# Patient Record
Sex: Female | Born: 1940
Health system: Southern US, Community
[De-identification: ages and names within clinical notes are randomized; demographics above are authoritative.]

## PROBLEM LIST (undated history)

## (undated) DIAGNOSIS — L900 Lichen sclerosus et atrophicus: Secondary | ICD-10-CM

## (undated) DIAGNOSIS — M1990 Unspecified osteoarthritis, unspecified site: Secondary | ICD-10-CM

## (undated) DIAGNOSIS — Z923 Personal history of irradiation: Secondary | ICD-10-CM

## (undated) DIAGNOSIS — C50919 Malignant neoplasm of unspecified site of unspecified female breast: Secondary | ICD-10-CM

## (undated) DIAGNOSIS — Z9221 Personal history of antineoplastic chemotherapy: Secondary | ICD-10-CM

## (undated) DIAGNOSIS — F419 Anxiety disorder, unspecified: Secondary | ICD-10-CM

## (undated) DIAGNOSIS — N879 Dysplasia of cervix uteri, unspecified: Secondary | ICD-10-CM

## (undated) DIAGNOSIS — E785 Hyperlipidemia, unspecified: Secondary | ICD-10-CM

## (undated) DIAGNOSIS — B029 Zoster without complications: Secondary | ICD-10-CM

## (undated) DIAGNOSIS — K219 Gastro-esophageal reflux disease without esophagitis: Secondary | ICD-10-CM

## (undated) DIAGNOSIS — I10 Essential (primary) hypertension: Secondary | ICD-10-CM

## (undated) HISTORY — PX: TUBAL LIGATION: SHX77

## (undated) HISTORY — DX: Malignant neoplasm of unspecified site of unspecified female breast: C50.919

## (undated) HISTORY — DX: Zoster without complications: B02.9

## (undated) HISTORY — DX: Unspecified osteoarthritis, unspecified site: M19.90

## (undated) HISTORY — PX: BREAST SURGERY: SHX581

## (undated) HISTORY — DX: Lichen sclerosus et atrophicus: L90.0

## (undated) HISTORY — DX: Hyperlipidemia, unspecified: E78.5

## (undated) HISTORY — PX: BREAST LUMPECTOMY: SHX2

## (undated) HISTORY — DX: Essential (primary) hypertension: I10

## (undated) HISTORY — PX: GYNECOLOGIC CRYOSURGERY: SHX857

## (undated) HISTORY — DX: Dysplasia of cervix uteri, unspecified: N87.9

## (undated) HISTORY — PX: HYSTEROSCOPY: SHX211

---

## 1997-01-24 DIAGNOSIS — Z923 Personal history of irradiation: Secondary | ICD-10-CM

## 1997-01-24 HISTORY — DX: Personal history of irradiation: Z92.3

## 1997-08-01 ENCOUNTER — Encounter: Admission: RE | Admit: 1997-08-01 | Discharge: 1997-10-30 | Payer: Self-pay | Admitting: Radiation Oncology

## 1997-08-19 ENCOUNTER — Encounter: Admission: RE | Admit: 1997-08-19 | Discharge: 1997-11-17 | Payer: Self-pay | Admitting: Radiation Oncology

## 1997-08-19 ENCOUNTER — Emergency Department (HOSPITAL_COMMUNITY): Admission: EM | Admit: 1997-08-19 | Discharge: 1997-08-19 | Payer: Self-pay | Admitting: Emergency Medicine

## 1997-10-27 ENCOUNTER — Other Ambulatory Visit: Admission: RE | Admit: 1997-10-27 | Discharge: 1997-10-27 | Payer: Self-pay | Admitting: Obstetrics and Gynecology

## 1998-05-08 ENCOUNTER — Ambulatory Visit (HOSPITAL_COMMUNITY): Admission: RE | Admit: 1998-05-08 | Discharge: 1998-05-08 | Payer: Self-pay | Admitting: Gastroenterology

## 1999-01-04 ENCOUNTER — Other Ambulatory Visit: Admission: RE | Admit: 1999-01-04 | Discharge: 1999-01-04 | Payer: Self-pay | Admitting: *Deleted

## 1999-01-13 ENCOUNTER — Encounter: Payer: Self-pay | Admitting: Oncology

## 1999-01-13 ENCOUNTER — Encounter: Admission: RE | Admit: 1999-01-13 | Discharge: 1999-01-13 | Payer: Self-pay | Admitting: Oncology

## 1999-06-17 ENCOUNTER — Ambulatory Visit (HOSPITAL_COMMUNITY): Admission: RE | Admit: 1999-06-17 | Discharge: 1999-06-17 | Payer: Self-pay | Admitting: Obstetrics and Gynecology

## 1999-06-17 ENCOUNTER — Encounter (INDEPENDENT_AMBULATORY_CARE_PROVIDER_SITE_OTHER): Payer: Self-pay | Admitting: Specialist

## 1999-12-27 ENCOUNTER — Encounter: Admission: RE | Admit: 1999-12-27 | Discharge: 1999-12-27 | Payer: Self-pay | Admitting: Oncology

## 1999-12-27 ENCOUNTER — Encounter: Payer: Self-pay | Admitting: Oncology

## 2001-01-19 ENCOUNTER — Encounter: Admission: RE | Admit: 2001-01-19 | Discharge: 2001-01-19 | Payer: Self-pay | Admitting: Oncology

## 2001-01-19 ENCOUNTER — Encounter: Payer: Self-pay | Admitting: Oncology

## 2002-01-21 ENCOUNTER — Encounter: Payer: Self-pay | Admitting: Oncology

## 2002-01-21 ENCOUNTER — Encounter: Admission: RE | Admit: 2002-01-21 | Discharge: 2002-01-21 | Payer: Self-pay | Admitting: Oncology

## 2003-01-15 ENCOUNTER — Ambulatory Visit (HOSPITAL_COMMUNITY): Admission: RE | Admit: 2003-01-15 | Discharge: 2003-01-15 | Payer: Self-pay | Admitting: Family Medicine

## 2004-01-21 ENCOUNTER — Ambulatory Visit (HOSPITAL_COMMUNITY): Admission: RE | Admit: 2004-01-21 | Discharge: 2004-01-21 | Payer: Self-pay | Admitting: Family Medicine

## 2004-03-22 ENCOUNTER — Ambulatory Visit: Payer: Self-pay | Admitting: Oncology

## 2004-04-13 ENCOUNTER — Ambulatory Visit (HOSPITAL_COMMUNITY): Admission: RE | Admit: 2004-04-13 | Discharge: 2004-04-13 | Payer: Self-pay | Admitting: Orthopaedic Surgery

## 2005-01-21 ENCOUNTER — Encounter: Admission: RE | Admit: 2005-01-21 | Discharge: 2005-01-21 | Payer: Self-pay | Admitting: Oncology

## 2005-03-23 ENCOUNTER — Ambulatory Visit: Payer: Self-pay | Admitting: Oncology

## 2005-04-06 ENCOUNTER — Ambulatory Visit: Payer: Self-pay | Admitting: Internal Medicine

## 2005-04-15 ENCOUNTER — Ambulatory Visit: Payer: Self-pay | Admitting: Internal Medicine

## 2006-02-28 ENCOUNTER — Encounter: Admission: RE | Admit: 2006-02-28 | Discharge: 2006-02-28 | Payer: Self-pay | Admitting: Oncology

## 2006-03-28 ENCOUNTER — Ambulatory Visit: Payer: Self-pay | Admitting: Oncology

## 2006-03-31 LAB — CBC WITH DIFFERENTIAL/PLATELET
Basophils Absolute: 0.1 10*3/uL (ref 0.0–0.1)
Eosinophils Absolute: 0 10*3/uL (ref 0.0–0.5)
HGB: 12.6 g/dL (ref 11.6–15.9)
LYMPH%: 18.9 % (ref 14.0–48.0)
MCV: 94.9 fL (ref 81.0–101.0)
MONO%: 8.8 % (ref 0.0–13.0)
NEUT#: 3.7 10*3/uL (ref 1.5–6.5)
Platelets: 297 10*3/uL (ref 145–400)

## 2006-03-31 LAB — COMPREHENSIVE METABOLIC PANEL
Albumin: 4.5 g/dL (ref 3.5–5.2)
Alkaline Phosphatase: 69 U/L (ref 39–117)
BUN: 14 mg/dL (ref 6–23)
Glucose, Bld: 91 mg/dL (ref 70–99)
Total Bilirubin: 1.7 mg/dL — ABNORMAL HIGH (ref 0.3–1.2)

## 2006-04-25 ENCOUNTER — Ambulatory Visit: Payer: Self-pay | Admitting: Family Medicine

## 2007-01-23 ENCOUNTER — Other Ambulatory Visit: Admission: RE | Admit: 2007-01-23 | Discharge: 2007-01-23 | Payer: Self-pay | Admitting: Obstetrics & Gynecology

## 2007-03-02 ENCOUNTER — Encounter: Admission: RE | Admit: 2007-03-02 | Discharge: 2007-03-02 | Payer: Self-pay | Admitting: Oncology

## 2007-04-09 ENCOUNTER — Ambulatory Visit: Payer: Self-pay | Admitting: Oncology

## 2007-04-11 LAB — COMPREHENSIVE METABOLIC PANEL
Alkaline Phosphatase: 59 U/L (ref 39–117)
BUN: 13 mg/dL (ref 6–23)
Glucose, Bld: 91 mg/dL (ref 70–99)
Sodium: 141 mEq/L (ref 135–145)
Total Bilirubin: 1.5 mg/dL — ABNORMAL HIGH (ref 0.3–1.2)

## 2007-04-11 LAB — CBC WITH DIFFERENTIAL/PLATELET
BASO%: 0.3 % (ref 0.0–2.0)
EOS%: 0.4 % (ref 0.0–7.0)
HCT: 37.1 % (ref 34.8–46.6)
LYMPH%: 19.1 % (ref 14.0–48.0)
MCH: 32.3 pg (ref 26.0–34.0)
MCHC: 34.8 g/dL (ref 32.0–36.0)
MONO#: 0.5 10*3/uL (ref 0.1–0.9)
NEUT%: 70.9 % (ref 39.6–76.8)
RBC: 3.99 10*6/uL (ref 3.70–5.32)
WBC: 5.2 10*3/uL (ref 3.9–10.0)
lymph#: 1 10*3/uL (ref 0.9–3.3)

## 2008-01-25 DIAGNOSIS — Z9221 Personal history of antineoplastic chemotherapy: Secondary | ICD-10-CM

## 2008-01-25 HISTORY — PX: BREAST BIOPSY: SHX20

## 2008-01-25 HISTORY — DX: Personal history of antineoplastic chemotherapy: Z92.21

## 2008-01-25 HISTORY — PX: BREAST LUMPECTOMY: SHX2

## 2008-03-03 ENCOUNTER — Encounter: Admission: RE | Admit: 2008-03-03 | Discharge: 2008-03-03 | Payer: Self-pay | Admitting: Oncology

## 2008-03-11 ENCOUNTER — Encounter: Admission: RE | Admit: 2008-03-11 | Discharge: 2008-03-11 | Payer: Self-pay | Admitting: Oncology

## 2008-03-14 ENCOUNTER — Encounter (INDEPENDENT_AMBULATORY_CARE_PROVIDER_SITE_OTHER): Payer: Self-pay | Admitting: Diagnostic Radiology

## 2008-03-14 ENCOUNTER — Encounter: Admission: RE | Admit: 2008-03-14 | Discharge: 2008-03-14 | Payer: Self-pay | Admitting: Oncology

## 2008-05-09 ENCOUNTER — Other Ambulatory Visit: Admission: RE | Admit: 2008-05-09 | Discharge: 2008-05-09 | Payer: Self-pay | Admitting: Obstetrics and Gynecology

## 2008-05-23 ENCOUNTER — Ambulatory Visit (HOSPITAL_COMMUNITY): Admission: RE | Admit: 2008-05-23 | Discharge: 2008-05-23 | Payer: Self-pay | Admitting: General Surgery

## 2008-05-23 ENCOUNTER — Encounter: Admission: RE | Admit: 2008-05-23 | Discharge: 2008-05-23 | Payer: Self-pay | Admitting: General Surgery

## 2008-05-23 ENCOUNTER — Encounter (INDEPENDENT_AMBULATORY_CARE_PROVIDER_SITE_OTHER): Payer: Self-pay | Admitting: General Surgery

## 2008-06-11 ENCOUNTER — Ambulatory Visit: Payer: Self-pay | Admitting: Oncology

## 2008-06-13 LAB — CBC WITH DIFFERENTIAL/PLATELET
Basophils Absolute: 0 10*3/uL (ref 0.0–0.1)
Eosinophils Absolute: 0 10*3/uL (ref 0.0–0.5)
HGB: 12.3 g/dL (ref 11.6–15.9)
MCV: 94.5 fL (ref 79.5–101.0)
MONO#: 0.4 10*3/uL (ref 0.1–0.9)
MONO%: 9.4 % (ref 0.0–14.0)
NEUT#: 2.6 10*3/uL (ref 1.5–6.5)
RBC: 3.8 10*6/uL (ref 3.70–5.45)
RDW: 13.3 % (ref 11.2–14.5)
WBC: 4.2 10*3/uL (ref 3.9–10.3)
lymph#: 1.1 10*3/uL (ref 0.9–3.3)

## 2008-06-13 LAB — COMPREHENSIVE METABOLIC PANEL
Albumin: 4.3 g/dL (ref 3.5–5.2)
Alkaline Phosphatase: 66 U/L (ref 39–117)
BUN: 13 mg/dL (ref 6–23)
CO2: 28 mEq/L (ref 19–32)
Calcium: 9.8 mg/dL (ref 8.4–10.5)
Chloride: 101 mEq/L (ref 96–112)
Glucose, Bld: 91 mg/dL (ref 70–99)
Potassium: 3.8 mEq/L (ref 3.5–5.3)
Sodium: 141 mEq/L (ref 135–145)
Total Protein: 7.7 g/dL (ref 6.0–8.3)

## 2008-06-13 LAB — CANCER ANTIGEN 27.29: CA 27.29: 14 U/mL (ref 0–39)

## 2008-06-13 LAB — CEA: CEA: 0.5 ng/mL (ref 0.0–5.0)

## 2008-06-15 ENCOUNTER — Encounter: Admission: RE | Admit: 2008-06-15 | Discharge: 2008-06-15 | Payer: Self-pay | Admitting: Oncology

## 2008-06-17 ENCOUNTER — Encounter: Admission: RE | Admit: 2008-06-17 | Discharge: 2008-06-17 | Payer: Self-pay | Admitting: Oncology

## 2008-06-18 ENCOUNTER — Ambulatory Visit (HOSPITAL_COMMUNITY): Admission: RE | Admit: 2008-06-18 | Discharge: 2008-06-19 | Payer: Self-pay | Admitting: General Surgery

## 2008-06-18 ENCOUNTER — Encounter (INDEPENDENT_AMBULATORY_CARE_PROVIDER_SITE_OTHER): Payer: Self-pay | Admitting: General Surgery

## 2008-07-07 LAB — CBC WITH DIFFERENTIAL/PLATELET
Eosinophils Absolute: 0.1 10*3/uL (ref 0.0–0.5)
HCT: 35.7 % (ref 34.8–46.6)
HGB: 12.3 g/dL (ref 11.6–15.9)
LYMPH%: 28.5 % (ref 14.0–49.7)
MONO#: 0.5 10*3/uL (ref 0.1–0.9)
NEUT#: 2.9 10*3/uL (ref 1.5–6.5)
NEUT%: 59.5 % (ref 38.4–76.8)
Platelets: 252 10*3/uL (ref 145–400)
RBC: 3.9 10*6/uL (ref 3.70–5.45)
WBC: 4.9 10*3/uL (ref 3.9–10.3)

## 2008-07-14 ENCOUNTER — Ambulatory Visit (HOSPITAL_BASED_OUTPATIENT_CLINIC_OR_DEPARTMENT_OTHER): Admission: RE | Admit: 2008-07-14 | Discharge: 2008-07-14 | Payer: Self-pay | Admitting: General Surgery

## 2008-07-16 ENCOUNTER — Ambulatory Visit (HOSPITAL_COMMUNITY): Admission: RE | Admit: 2008-07-16 | Discharge: 2008-07-16 | Payer: Self-pay | Admitting: Oncology

## 2008-07-18 ENCOUNTER — Ambulatory Visit: Payer: Self-pay | Admitting: Oncology

## 2008-07-18 LAB — CBC WITH DIFFERENTIAL/PLATELET
Basophils Absolute: 0 10*3/uL (ref 0.0–0.1)
EOS%: 0 % (ref 0.0–7.0)
HGB: 12.2 g/dL (ref 11.6–15.9)
MCH: 31.4 pg (ref 25.1–34.0)
MCV: 90.2 fL (ref 79.5–101.0)
MONO%: 7.1 % (ref 0.0–14.0)
NEUT#: 9.7 10*3/uL — ABNORMAL HIGH (ref 1.5–6.5)
RBC: 3.88 10*6/uL (ref 3.70–5.45)
RDW: 12.8 % (ref 11.2–14.5)
lymph#: 0.7 10*3/uL — ABNORMAL LOW (ref 0.9–3.3)

## 2008-07-25 LAB — CBC WITH DIFFERENTIAL/PLATELET
Basophils Absolute: 0.1 10*3/uL (ref 0.0–0.1)
Eosinophils Absolute: 0 10*3/uL (ref 0.0–0.5)
HCT: 34.5 % — ABNORMAL LOW (ref 34.8–46.6)
HGB: 11.9 g/dL (ref 11.6–15.9)
MCV: 90.3 fL (ref 79.5–101.0)
MONO%: 20.8 % — ABNORMAL HIGH (ref 0.0–14.0)
NEUT#: 5.7 10*3/uL (ref 1.5–6.5)
NEUT%: 61.2 % (ref 38.4–76.8)
RDW: 12.7 % (ref 11.2–14.5)
lymph#: 1.6 10*3/uL (ref 0.9–3.3)

## 2008-08-04 LAB — CBC WITH DIFFERENTIAL/PLATELET
Eosinophils Absolute: 0 10*3/uL (ref 0.0–0.5)
HCT: 32.1 % — ABNORMAL LOW (ref 34.8–46.6)
LYMPH%: 18.6 % (ref 14.0–49.7)
MCV: 94.8 fL (ref 79.5–101.0)
MONO#: 0.4 10*3/uL (ref 0.1–0.9)
NEUT#: 4.2 10*3/uL (ref 1.5–6.5)
NEUT%: 74.6 % (ref 38.4–76.8)
Platelets: 239 10*3/uL (ref 145–400)
WBC: 5.7 10*3/uL (ref 3.9–10.3)

## 2008-08-05 LAB — COMPREHENSIVE METABOLIC PANEL
BUN: 14 mg/dL (ref 6–23)
CO2: 26 mEq/L (ref 19–32)
Creatinine, Ser: 0.77 mg/dL (ref 0.40–1.20)
Glucose, Bld: 80 mg/dL (ref 70–99)
Total Bilirubin: 0.7 mg/dL (ref 0.3–1.2)

## 2008-08-13 ENCOUNTER — Ambulatory Visit: Payer: Self-pay | Admitting: Gynecology

## 2008-08-13 ENCOUNTER — Other Ambulatory Visit: Admission: RE | Admit: 2008-08-13 | Discharge: 2008-08-13 | Payer: Self-pay | Admitting: Gynecology

## 2008-08-13 ENCOUNTER — Encounter: Payer: Self-pay | Admitting: Gynecology

## 2008-08-25 ENCOUNTER — Ambulatory Visit: Payer: Self-pay | Admitting: Oncology

## 2008-08-26 ENCOUNTER — Ambulatory Visit: Admission: RE | Admit: 2008-08-26 | Discharge: 2008-10-07 | Payer: Self-pay | Admitting: Radiation Oncology

## 2008-08-27 LAB — CBC WITH DIFFERENTIAL/PLATELET
BASO%: 0.9 % (ref 0.0–2.0)
Eosinophils Absolute: 0 10*3/uL (ref 0.0–0.5)
HCT: 33.1 % — ABNORMAL LOW (ref 34.8–46.6)
LYMPH%: 22.4 % (ref 14.0–49.7)
MCHC: 33.8 g/dL (ref 31.5–36.0)
MONO#: 0.7 10*3/uL (ref 0.1–0.9)
NEUT#: 3.8 10*3/uL (ref 1.5–6.5)
NEUT%: 64.9 % (ref 38.4–76.8)
Platelets: 204 10*3/uL (ref 145–400)
RBC: 3.61 10*6/uL — ABNORMAL LOW (ref 3.70–5.45)
WBC: 5.8 10*3/uL (ref 3.9–10.3)
lymph#: 1.3 10*3/uL (ref 0.9–3.3)

## 2008-08-29 LAB — COMPREHENSIVE METABOLIC PANEL
ALT: 21 U/L (ref 0–35)
CO2: 24 mEq/L (ref 19–32)
Calcium: 9.5 mg/dL (ref 8.4–10.5)
Chloride: 101 mEq/L (ref 96–112)
Glucose, Bld: 129 mg/dL — ABNORMAL HIGH (ref 70–99)
Sodium: 139 mEq/L (ref 135–145)
Total Bilirubin: 0.7 mg/dL (ref 0.3–1.2)
Total Protein: 7 g/dL (ref 6.0–8.3)

## 2008-09-16 LAB — CBC WITH DIFFERENTIAL/PLATELET
BASO%: 1 % (ref 0.0–2.0)
Eosinophils Absolute: 0 10*3/uL (ref 0.0–0.5)
MCHC: 33.5 g/dL (ref 31.5–36.0)
MONO#: 0.8 10*3/uL (ref 0.1–0.9)
NEUT#: 5.3 10*3/uL (ref 1.5–6.5)
RBC: 3.38 10*6/uL — ABNORMAL LOW (ref 3.70–5.45)
RDW: 14.8 % — ABNORMAL HIGH (ref 11.2–14.5)
WBC: 7.3 10*3/uL (ref 3.9–10.3)
lymph#: 1.2 10*3/uL (ref 0.9–3.3)

## 2008-09-19 LAB — LIPID PANEL
Cholesterol: 173 mg/dL (ref 0–200)
HDL: 43 mg/dL (ref >39)
Total CHOL/HDL Ratio: 4 Ratio
Triglycerides: 91 mg/dL (ref <150)
VLDL: 18 mg/dL (ref 0–40)

## 2008-09-19 LAB — COMPREHENSIVE METABOLIC PANEL
AST: 34 U/L (ref 0–37)
Albumin: 3.8 g/dL (ref 3.5–5.2)
Alkaline Phosphatase: 62 U/L (ref 39–117)
BUN: 16 mg/dL (ref 6–23)
Creatinine, Ser: 0.8 mg/dL (ref 0.40–1.20)
Glucose, Bld: 145 mg/dL — ABNORMAL HIGH (ref 70–99)
Potassium: 3.5 mEq/L (ref 3.5–5.3)
Total Bilirubin: 0.9 mg/dL (ref 0.3–1.2)

## 2008-09-19 LAB — IRON AND TIBC: UIBC: 226 ug/dL

## 2008-10-01 ENCOUNTER — Ambulatory Visit: Payer: Self-pay | Admitting: Gynecology

## 2008-10-06 ENCOUNTER — Ambulatory Visit: Payer: Self-pay | Admitting: Oncology

## 2008-10-08 LAB — CBC WITH DIFFERENTIAL/PLATELET
BASO%: 0.6 % (ref 0.0–2.0)
Basophils Absolute: 0 10*3/uL (ref 0.0–0.1)
EOS%: 0.2 % (ref 0.0–7.0)
Eosinophils Absolute: 0 10*3/uL (ref 0.0–0.5)
HCT: 31 % — ABNORMAL LOW (ref 34.8–46.6)
HGB: 10.3 g/dL — ABNORMAL LOW (ref 11.6–15.9)
LYMPH%: 22.3 % (ref 14.0–49.7)
MCH: 31.3 pg (ref 25.1–34.0)
MCHC: 33.2 g/dL (ref 31.5–36.0)
MCV: 94.2 fL (ref 79.5–101.0)
MONO#: 0.8 10*3/uL (ref 0.1–0.9)
MONO%: 12.9 % (ref 0.0–14.0)
NEUT#: 4.1 10*3/uL (ref 1.5–6.5)
NEUT%: 64 % (ref 38.4–76.8)
Platelets: 248 10*3/uL (ref 145–400)
RBC: 3.29 10*6/uL — ABNORMAL LOW (ref 3.70–5.45)
RDW: 15.7 % — ABNORMAL HIGH (ref 11.2–14.5)
WBC: 6.4 10*3/uL (ref 3.9–10.3)
lymph#: 1.4 10*3/uL (ref 0.9–3.3)
nRBC: 0 % (ref 0–0)

## 2008-10-10 LAB — COMPREHENSIVE METABOLIC PANEL
CO2: 24 mEq/L (ref 19–32)
Calcium: 9 mg/dL (ref 8.4–10.5)
Chloride: 102 mEq/L (ref 96–112)
Creatinine, Ser: 0.69 mg/dL (ref 0.40–1.20)
Glucose, Bld: 142 mg/dL — ABNORMAL HIGH (ref 70–99)
Total Bilirubin: 0.6 mg/dL (ref 0.3–1.2)

## 2008-10-28 LAB — CBC WITH DIFFERENTIAL/PLATELET
EOS%: 0 % (ref 0.0–7.0)
Eosinophils Absolute: 0 10*3/uL (ref 0.0–0.5)
HGB: 10.1 g/dL — ABNORMAL LOW (ref 11.6–15.9)
MCV: 96 fL (ref 79.5–101.0)
MONO%: 7.8 % (ref 0.0–14.0)
NEUT#: 4.7 10*3/uL (ref 1.5–6.5)
RBC: 3.24 10*6/uL — ABNORMAL LOW (ref 3.70–5.45)
RDW: 15.6 % — ABNORMAL HIGH (ref 11.2–14.5)
lymph#: 1 10*3/uL (ref 0.9–3.3)

## 2008-10-29 ENCOUNTER — Ambulatory Visit: Payer: Self-pay | Admitting: Oncology

## 2008-10-31 LAB — COMPREHENSIVE METABOLIC PANEL
ALT: 23 U/L (ref 0–35)
AST: 25 U/L (ref 0–37)
Alkaline Phosphatase: 55 U/L (ref 39–117)
BUN: 18 mg/dL (ref 6–23)
Creatinine, Ser: 0.81 mg/dL (ref 0.40–1.20)
Potassium: 3.8 mEq/L (ref 3.5–5.3)

## 2008-11-07 LAB — CBC WITH DIFFERENTIAL/PLATELET
BASO%: 2.6 % — ABNORMAL HIGH (ref 0.0–2.0)
Basophils Absolute: 0 10*3/uL (ref 0.0–0.1)
EOS%: 0 % (ref 0.0–7.0)
HCT: 31 % — ABNORMAL LOW (ref 34.8–46.6)
HGB: 10.5 g/dL — ABNORMAL LOW (ref 11.6–15.9)
LYMPH%: 76.6 % — ABNORMAL HIGH (ref 14.0–49.7)
MCH: 31.7 pg (ref 25.1–34.0)
MCHC: 33.9 g/dL (ref 31.5–36.0)
MCV: 93.7 fL (ref 79.5–101.0)
MONO%: 9.1 % (ref 0.0–14.0)
NEUT%: 11.7 % — ABNORMAL LOW (ref 38.4–76.8)

## 2008-11-10 LAB — CBC WITH DIFFERENTIAL/PLATELET
EOS%: 0 % (ref 0.0–7.0)
Eosinophils Absolute: 0 10*3/uL (ref 0.0–0.5)
LYMPH%: 21.3 % (ref 14.0–49.7)
MCH: 31.6 pg (ref 25.1–34.0)
MCHC: 33.6 g/dL (ref 31.5–36.0)
MCV: 94.2 fL (ref 79.5–101.0)
MONO%: 39.5 % — ABNORMAL HIGH (ref 0.0–14.0)
NEUT#: 1.8 10*3/uL (ref 1.5–6.5)
Platelets: 191 10*3/uL (ref 145–400)
RBC: 3.1 10*6/uL — ABNORMAL LOW (ref 3.70–5.45)
nRBC: 2 % — ABNORMAL HIGH (ref 0–0)

## 2008-11-18 ENCOUNTER — Ambulatory Visit: Admission: RE | Admit: 2008-11-18 | Discharge: 2009-01-23 | Payer: Self-pay | Admitting: Radiation Oncology

## 2008-11-19 LAB — CBC WITH DIFFERENTIAL/PLATELET
BASO%: 0.7 % (ref 0.0–2.0)
EOS%: 0 % (ref 0.0–7.0)
HCT: 31.2 % — ABNORMAL LOW (ref 34.8–46.6)
LYMPH%: 24.1 % (ref 14.0–49.7)
MCH: 30.9 pg (ref 25.1–34.0)
MCHC: 32.4 g/dL (ref 31.5–36.0)
NEUT%: 68 % (ref 38.4–76.8)
RBC: 3.27 10*6/uL — ABNORMAL LOW (ref 3.70–5.45)
lymph#: 1.1 10*3/uL (ref 0.9–3.3)

## 2008-12-10 ENCOUNTER — Encounter: Admission: RE | Admit: 2008-12-10 | Discharge: 2008-12-10 | Payer: Self-pay | Admitting: Oncology

## 2008-12-12 ENCOUNTER — Ambulatory Visit: Payer: Self-pay | Admitting: Oncology

## 2008-12-17 LAB — CBC WITH DIFFERENTIAL/PLATELET
Basophils Absolute: 0 10*3/uL (ref 0.0–0.1)
Eosinophils Absolute: 0.1 10*3/uL (ref 0.0–0.5)
HGB: 10.7 g/dL — ABNORMAL LOW (ref 11.6–15.9)
MCV: 97.8 fL (ref 79.5–101.0)
MONO#: 0.3 10*3/uL (ref 0.1–0.9)
MONO%: 9.9 % (ref 0.0–14.0)
NEUT#: 2.4 10*3/uL (ref 1.5–6.5)
RBC: 3.26 10*6/uL — ABNORMAL LOW (ref 3.70–5.45)
RDW: 15.9 % — ABNORMAL HIGH (ref 11.2–14.5)
WBC: 3.1 10*3/uL — ABNORMAL LOW (ref 3.9–10.3)
lymph#: 0.4 10*3/uL — ABNORMAL LOW (ref 0.9–3.3)

## 2008-12-17 LAB — COMPREHENSIVE METABOLIC PANEL
Albumin: 4 g/dL (ref 3.5–5.2)
Alkaline Phosphatase: 46 U/L (ref 39–117)
BUN: 15 mg/dL (ref 6–23)
CO2: 26 mEq/L (ref 19–32)
Calcium: 9.6 mg/dL (ref 8.4–10.5)
Chloride: 104 mEq/L (ref 96–112)
Glucose, Bld: 87 mg/dL (ref 70–99)
Potassium: 3.8 mEq/L (ref 3.5–5.3)
Sodium: 140 mEq/L (ref 135–145)
Total Protein: 6.6 g/dL (ref 6.0–8.3)

## 2009-01-09 ENCOUNTER — Ambulatory Visit: Payer: Self-pay | Admitting: Oncology

## 2009-01-13 LAB — CBC WITH DIFFERENTIAL/PLATELET
BASO%: 0.3 % (ref 0.0–2.0)
Basophils Absolute: 0 10*3/uL (ref 0.0–0.1)
EOS%: 3.6 % (ref 0.0–7.0)
HGB: 12 g/dL (ref 11.6–15.9)
MCH: 31.5 pg (ref 25.1–34.0)
MCV: 94.2 fL (ref 79.5–101.0)
MONO%: 10.1 % (ref 0.0–14.0)
NEUT#: 2.2 10*3/uL (ref 1.5–6.5)
RBC: 3.81 10*6/uL (ref 3.70–5.45)
RDW: 14.4 % (ref 11.2–14.5)
lymph#: 0.4 10*3/uL — ABNORMAL LOW (ref 0.9–3.3)

## 2009-01-26 ENCOUNTER — Ambulatory Visit: Admission: RE | Admit: 2009-01-26 | Discharge: 2009-02-06 | Payer: Self-pay | Admitting: Radiation Oncology

## 2009-03-12 ENCOUNTER — Encounter: Admission: RE | Admit: 2009-03-12 | Discharge: 2009-03-12 | Payer: Self-pay | Admitting: Oncology

## 2009-03-16 ENCOUNTER — Ambulatory Visit: Payer: Self-pay | Admitting: Oncology

## 2009-03-18 LAB — CBC WITH DIFFERENTIAL/PLATELET
BASO%: 0.3 % (ref 0.0–2.0)
EOS%: 1.9 % (ref 0.0–7.0)
HGB: 11.7 g/dL (ref 11.6–15.9)
MCH: 31.6 pg (ref 25.1–34.0)
MCHC: 33.6 g/dL (ref 31.5–36.0)
MONO#: 0.3 10*3/uL (ref 0.1–0.9)
RDW: 13.6 % (ref 11.2–14.5)
WBC: 3.8 10*3/uL — ABNORMAL LOW (ref 3.9–10.3)
lymph#: 0.7 10*3/uL — ABNORMAL LOW (ref 0.9–3.3)

## 2009-03-18 LAB — COMPREHENSIVE METABOLIC PANEL
ALT: 14 U/L (ref 0–35)
AST: 18 U/L (ref 0–37)
Albumin: 4 g/dL (ref 3.5–5.2)
CO2: 28 mEq/L (ref 19–32)
Calcium: 9.7 mg/dL (ref 8.4–10.5)
Chloride: 101 mEq/L (ref 96–112)
Potassium: 3.7 mEq/L (ref 3.5–5.3)
Total Protein: 7.2 g/dL (ref 6.0–8.3)

## 2009-04-27 ENCOUNTER — Ambulatory Visit: Payer: Self-pay | Admitting: Oncology

## 2009-04-29 LAB — CBC WITH DIFFERENTIAL/PLATELET
BASO%: 0.6 % (ref 0.0–2.0)
EOS%: 1.2 % (ref 0.0–7.0)
HCT: 34.9 % (ref 34.8–46.6)
LYMPH%: 15.7 % (ref 14.0–49.7)
MCH: 32.3 pg (ref 25.1–34.0)
MCHC: 33.5 g/dL (ref 31.5–36.0)
MONO#: 0.4 10*3/uL (ref 0.1–0.9)
NEUT%: 71.2 % (ref 38.4–76.8)
RBC: 3.62 10*6/uL — ABNORMAL LOW (ref 3.70–5.45)
WBC: 3.5 10*3/uL — ABNORMAL LOW (ref 3.9–10.3)
lymph#: 0.5 10*3/uL — ABNORMAL LOW (ref 0.9–3.3)

## 2009-06-26 ENCOUNTER — Ambulatory Visit: Payer: Self-pay | Admitting: Oncology

## 2009-06-29 LAB — COMPREHENSIVE METABOLIC PANEL
ALT: 13 U/L (ref 0–35)
AST: 19 U/L (ref 0–37)
CO2: 26 mEq/L (ref 19–32)
Calcium: 9.4 mg/dL (ref 8.4–10.5)
Chloride: 105 mEq/L (ref 96–112)
Creatinine, Ser: 0.64 mg/dL (ref 0.40–1.20)
Potassium: 3.6 mEq/L (ref 3.5–5.3)
Sodium: 141 mEq/L (ref 135–145)
Total Protein: 6.7 g/dL (ref 6.0–8.3)

## 2009-06-29 LAB — CBC WITH DIFFERENTIAL/PLATELET
BASO%: 0.3 % (ref 0.0–2.0)
EOS%: 1.6 % (ref 0.0–7.0)
HCT: 33.2 % — ABNORMAL LOW (ref 34.8–46.6)
MCH: 34.4 pg — ABNORMAL HIGH (ref 25.1–34.0)
MCHC: 35 g/dL (ref 31.5–36.0)
MONO#: 0.4 10*3/uL (ref 0.1–0.9)
RBC: 3.38 10*6/uL — ABNORMAL LOW (ref 3.70–5.45)
RDW: 13.9 % (ref 11.2–14.5)
WBC: 3.6 10*3/uL — ABNORMAL LOW (ref 3.9–10.3)
lymph#: 0.5 10*3/uL — ABNORMAL LOW (ref 0.9–3.3)

## 2009-08-26 ENCOUNTER — Ambulatory Visit: Payer: Self-pay | Admitting: Oncology

## 2009-09-29 ENCOUNTER — Other Ambulatory Visit: Admission: RE | Admit: 2009-09-29 | Discharge: 2009-09-29 | Payer: Self-pay | Admitting: Gynecology

## 2009-09-29 ENCOUNTER — Ambulatory Visit: Payer: Self-pay | Admitting: Gynecology

## 2009-10-26 ENCOUNTER — Ambulatory Visit: Payer: Self-pay | Admitting: Oncology

## 2009-10-28 LAB — COMPREHENSIVE METABOLIC PANEL
ALT: 16 U/L (ref 0–35)
AST: 22 U/L (ref 0–37)
Albumin: 4.1 g/dL (ref 3.5–5.2)
CO2: 28 mEq/L (ref 19–32)
Calcium: 9.5 mg/dL (ref 8.4–10.5)
Chloride: 102 mEq/L (ref 96–112)
Creatinine, Ser: 0.66 mg/dL (ref 0.40–1.20)
Potassium: 3.3 mEq/L — ABNORMAL LOW (ref 3.5–5.3)

## 2009-10-28 LAB — CBC WITH DIFFERENTIAL/PLATELET
BASO%: 0.4 % (ref 0.0–2.0)
Basophils Absolute: 0 10*3/uL (ref 0.0–0.1)
HCT: 35.7 % (ref 34.8–46.6)
HGB: 12.4 g/dL (ref 11.6–15.9)
MCHC: 34.7 g/dL (ref 31.5–36.0)
MONO#: 0.4 10*3/uL (ref 0.1–0.9)
NEUT%: 72.7 % (ref 38.4–76.8)
RDW: 13.5 % (ref 11.2–14.5)
WBC: 3.6 10*3/uL — ABNORMAL LOW (ref 3.9–10.3)
lymph#: 0.6 10*3/uL — ABNORMAL LOW (ref 0.9–3.3)

## 2009-12-02 ENCOUNTER — Ambulatory Visit: Payer: Self-pay | Admitting: Oncology

## 2009-12-04 ENCOUNTER — Ambulatory Visit (HOSPITAL_COMMUNITY): Admission: RE | Admit: 2009-12-04 | Discharge: 2009-12-04 | Payer: Self-pay | Admitting: Oncology

## 2010-01-24 DIAGNOSIS — B029 Zoster without complications: Secondary | ICD-10-CM

## 2010-01-24 HISTORY — DX: Zoster without complications: B02.9

## 2010-01-25 ENCOUNTER — Ambulatory Visit: Payer: Self-pay | Admitting: Gynecology

## 2010-02-08 ENCOUNTER — Ambulatory Visit: Payer: Self-pay | Admitting: Oncology

## 2010-02-10 LAB — CBC WITH DIFFERENTIAL/PLATELET
BASO%: 0.4 % (ref 0.0–2.0)
Basophils Absolute: 0 10*3/uL (ref 0.0–0.1)
EOS%: 0.8 % (ref 0.0–7.0)
Eosinophils Absolute: 0 10*3/uL (ref 0.0–0.5)
HCT: 37.2 % (ref 34.8–46.6)
HGB: 12.8 g/dL (ref 11.6–15.9)
LYMPH%: 18.5 % (ref 14.0–49.7)
MCH: 32.9 pg (ref 25.1–34.0)
MCHC: 34.4 g/dL (ref 31.5–36.0)
MCV: 95.6 fL (ref 79.5–101.0)
MONO#: 0.4 10*3/uL (ref 0.1–0.9)
MONO%: 8.2 % (ref 0.0–14.0)
NEUT#: 3.6 10*3/uL (ref 1.5–6.5)
NEUT%: 72.1 % (ref 38.4–76.8)
Platelets: 230 10*3/uL (ref 145–400)
RBC: 3.89 10*6/uL (ref 3.70–5.45)
RDW: 13.2 % (ref 11.2–14.5)
WBC: 5 10*3/uL (ref 3.9–10.3)
lymph#: 0.9 10*3/uL (ref 0.9–3.3)

## 2010-02-10 LAB — COMPREHENSIVE METABOLIC PANEL
ALT: 12 U/L (ref 0–35)
AST: 18 U/L (ref 0–37)
Albumin: 4.2 g/dL (ref 3.5–5.2)
Alkaline Phosphatase: 70 U/L (ref 39–117)
BUN: 15 mg/dL (ref 6–23)
CO2: 28 mEq/L (ref 19–32)
Calcium: 9.6 mg/dL (ref 8.4–10.5)
Chloride: 103 mEq/L (ref 96–112)
Creatinine, Ser: 0.73 mg/dL (ref 0.40–1.20)
Glucose, Bld: 106 mg/dL — ABNORMAL HIGH (ref 70–99)
Potassium: 3.6 mEq/L (ref 3.5–5.3)
Sodium: 141 mEq/L (ref 135–145)
Total Bilirubin: 0.8 mg/dL (ref 0.3–1.2)
Total Protein: 7 g/dL (ref 6.0–8.3)

## 2010-02-13 ENCOUNTER — Other Ambulatory Visit: Payer: Self-pay | Admitting: Oncology

## 2010-02-13 DIAGNOSIS — Z9889 Other specified postprocedural states: Secondary | ICD-10-CM

## 2010-02-15 ENCOUNTER — Encounter: Payer: Self-pay | Admitting: General Surgery

## 2010-02-15 ENCOUNTER — Encounter: Payer: Self-pay | Admitting: Oncology

## 2010-03-26 ENCOUNTER — Ambulatory Visit
Admission: RE | Admit: 2010-03-26 | Discharge: 2010-03-26 | Disposition: A | Discharge status: Home / Self Care | Payer: Medicare Other | Source: Ambulatory Visit | Attending: Oncology | Admitting: Oncology

## 2010-03-26 DIAGNOSIS — Z9889 Other specified postprocedural states: Secondary | ICD-10-CM

## 2010-04-01 ENCOUNTER — Other Ambulatory Visit: Payer: Self-pay | Admitting: Oncology

## 2010-04-01 DIAGNOSIS — Z9889 Other specified postprocedural states: Secondary | ICD-10-CM

## 2010-04-07 ENCOUNTER — Encounter (HOSPITAL_BASED_OUTPATIENT_CLINIC_OR_DEPARTMENT_OTHER): Payer: Medicare Other | Admitting: Oncology

## 2010-04-07 DIAGNOSIS — Z452 Encounter for adjustment and management of vascular access device: Secondary | ICD-10-CM

## 2010-04-07 DIAGNOSIS — C50419 Malignant neoplasm of upper-outer quadrant of unspecified female breast: Secondary | ICD-10-CM

## 2010-05-03 LAB — POCT HEMOGLOBIN-HEMACUE: Hemoglobin: 12.2 g/dL (ref 12.0–15.0)

## 2010-05-03 LAB — BASIC METABOLIC PANEL
BUN: 11 mg/dL (ref 6–23)
Calcium: 9.3 mg/dL (ref 8.4–10.5)
Chloride: 103 mEq/L (ref 96–112)
Creatinine, Ser: 0.73 mg/dL (ref 0.4–1.2)
GFR calc non Af Amer: >60 mL/min (ref >60)

## 2010-05-04 LAB — BASIC METABOLIC PANEL
CO2: 30 mEq/L (ref 19–32)
GFR calc Af Amer: >60 mL/min (ref >60)
GFR calc non Af Amer: >60 mL/min (ref >60)
Glucose, Bld: 100 mg/dL — ABNORMAL HIGH (ref 70–99)
Potassium: 3.9 mEq/L (ref 3.5–5.1)
Sodium: 140 mEq/L (ref 135–145)

## 2010-05-05 LAB — COMPREHENSIVE METABOLIC PANEL
ALT: 14 U/L (ref 0–35)
Alkaline Phosphatase: 62 U/L (ref 39–117)
BUN: 11 mg/dL (ref 6–23)
CO2: 32 mEq/L (ref 19–32)
GFR calc non Af Amer: >60 mL/min (ref >60)
Glucose, Bld: 96 mg/dL (ref 70–99)
Potassium: 3.9 mEq/L (ref 3.5–5.1)
Total Protein: 7.3 g/dL (ref 6.0–8.3)

## 2010-05-05 LAB — DIFFERENTIAL
Basophils Absolute: 0 10*3/uL (ref 0.0–0.1)
Basophils Relative: 1 % (ref 0–1)
Eosinophils Absolute: 0 10*3/uL (ref 0.0–0.7)
Monocytes Relative: 8 % (ref 3–12)
Neutro Abs: 3.7 10*3/uL (ref 1.7–7.7)
Neutrophils Relative %: 71 % (ref 43–77)

## 2010-05-05 LAB — CBC
HCT: 36.7 % (ref 36.0–46.0)
Hemoglobin: 12.7 g/dL (ref 12.0–15.0)
MCHC: 34.7 g/dL (ref 30.0–36.0)
RBC: 3.84 MIL/uL — ABNORMAL LOW (ref 3.87–5.11)
RDW: 13.4 % (ref 11.5–15.5)

## 2010-05-22 IMAGING — US UNKNOWN US STUDY
1 series · 13 of 13 positions shown · non-contrast
Comparison: Bilateral breast MRI 06/15/2008

CLINICAL DATA: Patient with recently diagnosed right breast
invasive ductal carcinoma at the time of excisional biopsy.
Bilateral breast MRI performed 06/15/2008, following the excisional
biopsy, showed a slightly prominent lymph node in the lower right
axilla/axillary tail of the right breast and asymmetric enhancement
in the right breast compared to the left predominately in the upper
central right breast.  Ultrasound was recommended for further
evaluation

RIGHT BREAST ULTRASOUND

[Series 1: unknown us study · 13 of 13 slices shown]
[im 1/13]
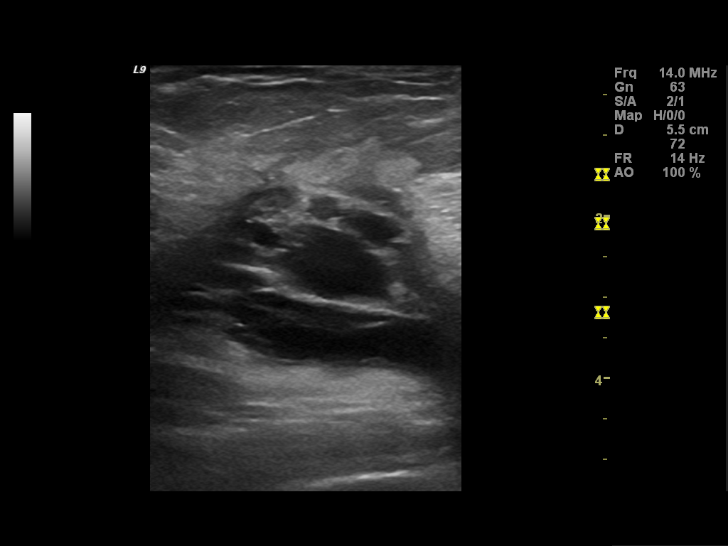
[im 2/13]
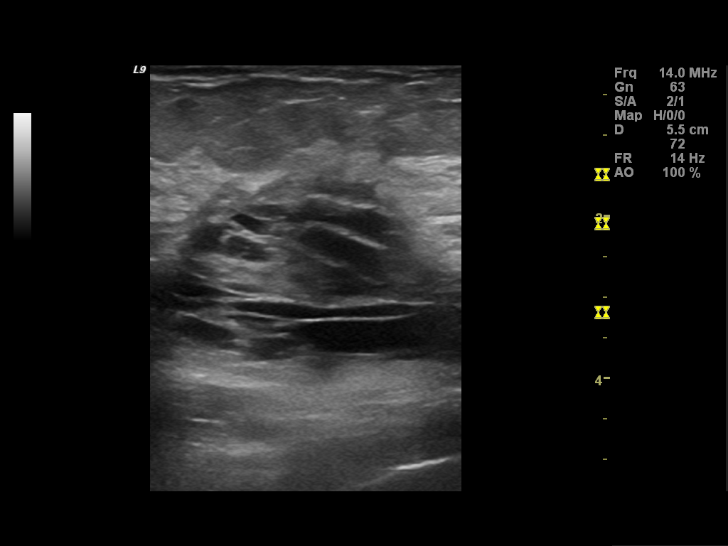
[im 3/13]
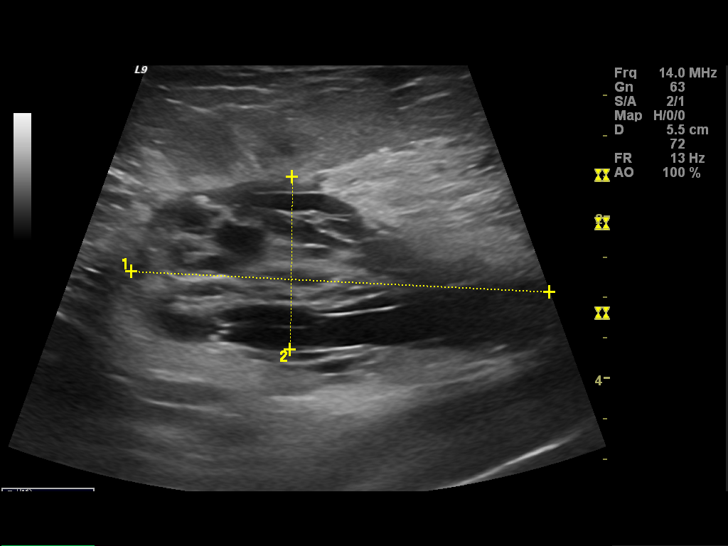
[im 4/13]
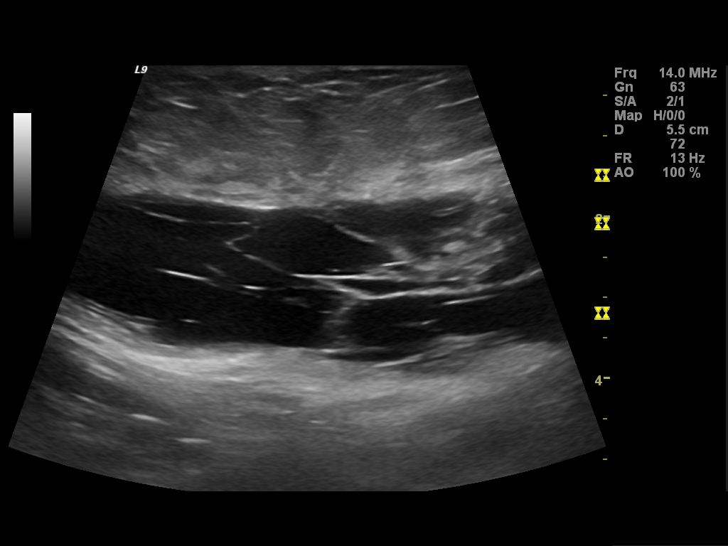
[im 5/13]
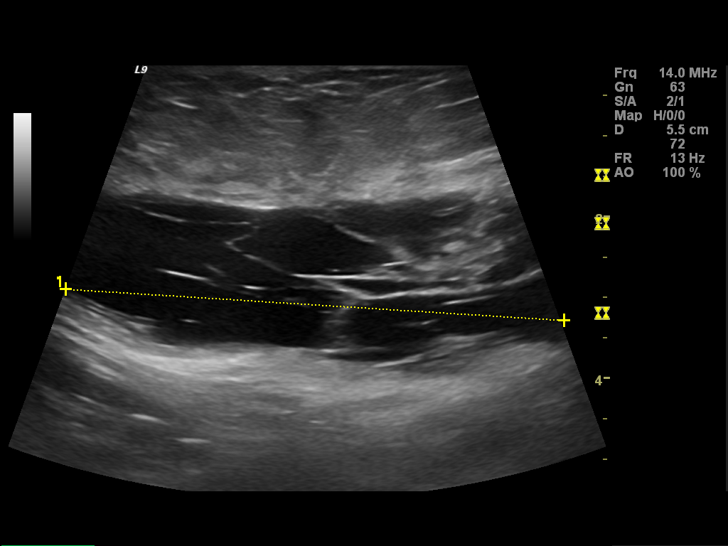
[im 6/13]
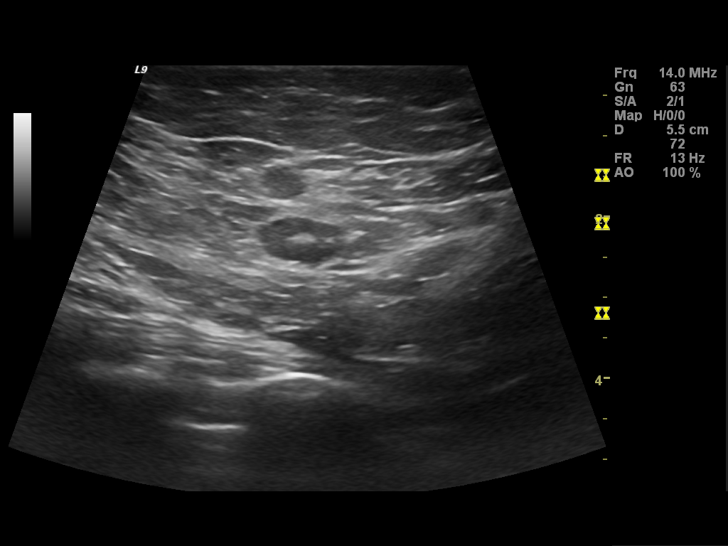
[im 7/13]
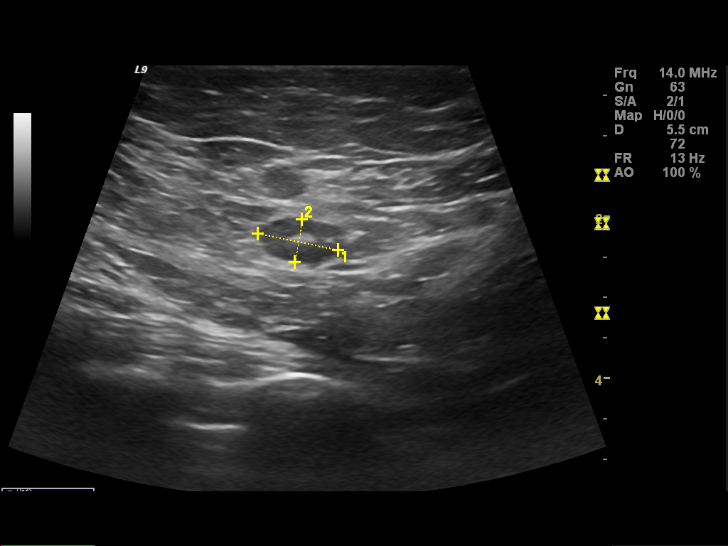
[im 8/13]
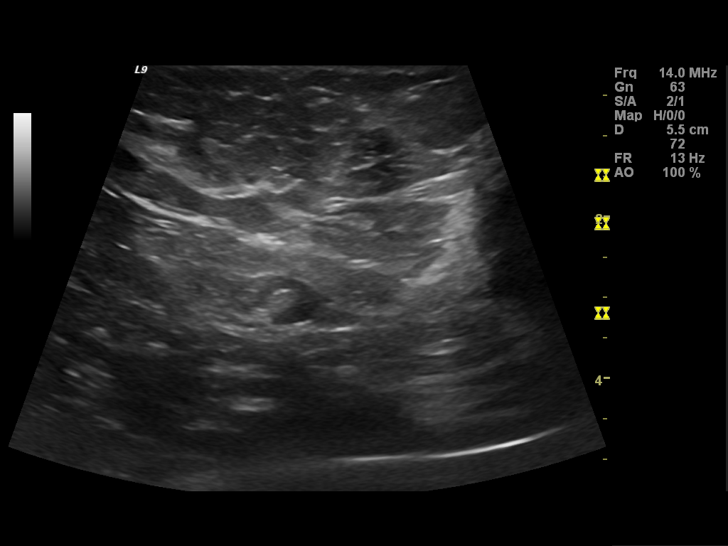
[im 9/13]
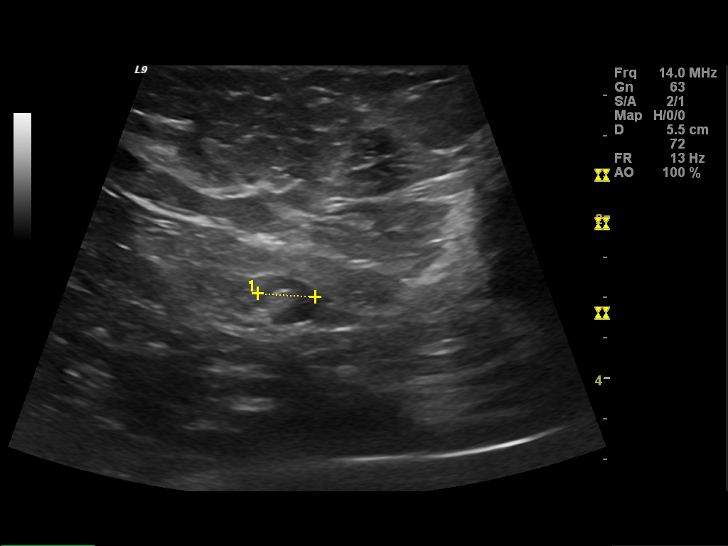
[im 10/13]
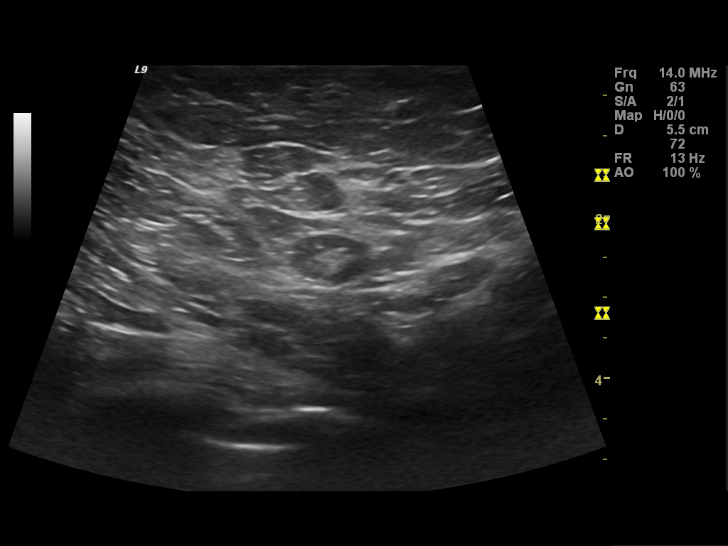
[im 11/13]
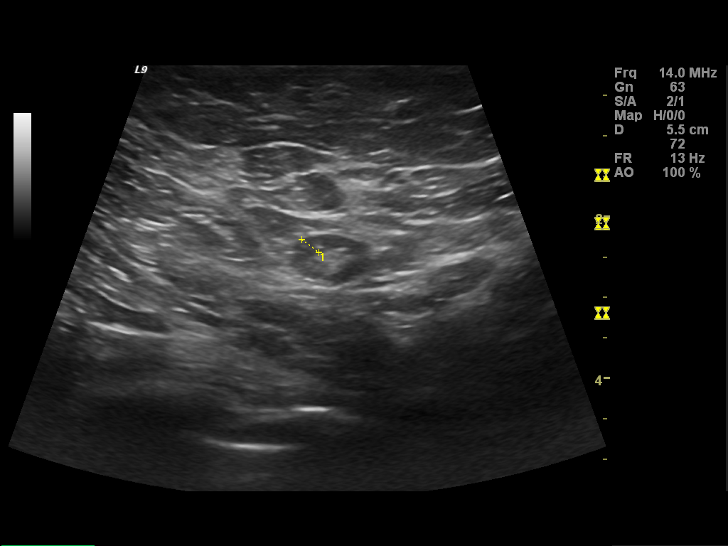
[im 12/13]
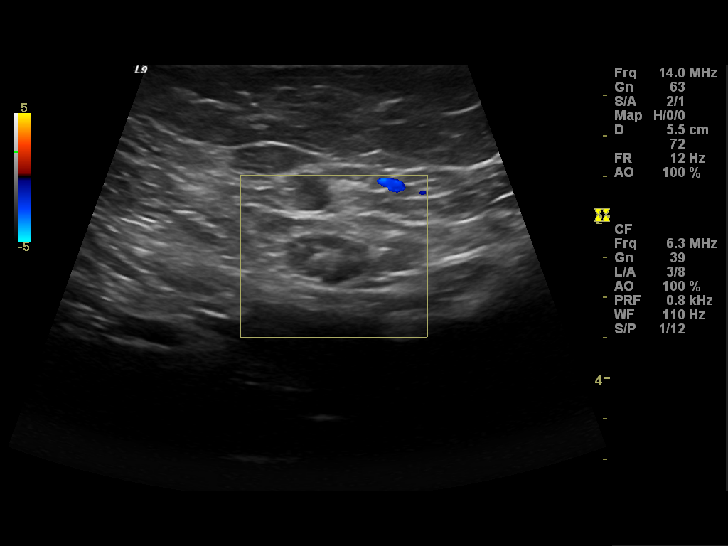
[im 13/13]
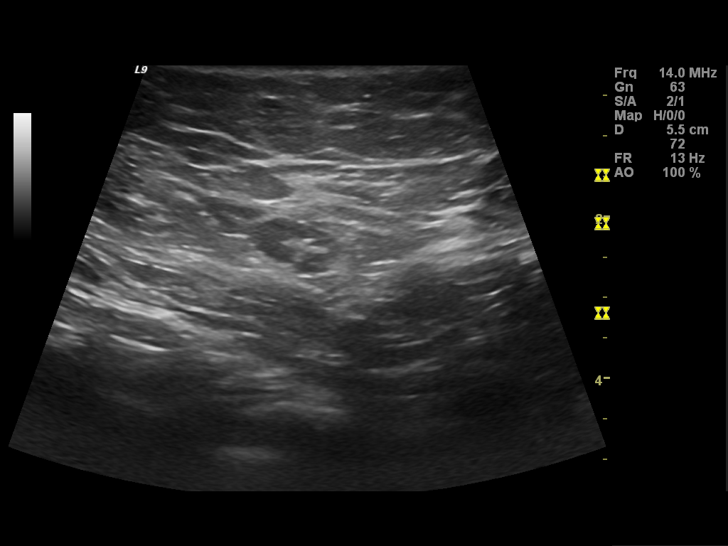

[13 of 13 positions shown; findings below may reference images not displayed]

On physical exam, there is a scar in the upper right breast from
recent excisional biopsy.  No lymphadenopathy is palpated in the
right axilla.  There is mild firmness in the upper right breast in
the region of recent surgery.
FINDINGS: Ultrasound is performed, showing a complex fluid
collection with, multiple internal septations in the upper right
breast 11-12 o'clock position,  centered approximately 6 cm from
the nipple which spans at least 6.5 by 5.2 x  2.1 cm, consistent
with postoperative seroma cavity and/or hematoma.  No suspicious
findings are identified on ultrasound of the upper right breast.

In the inferior right axilla/axillary tail of the right breast is a
lymph node that measures 1.0 x 0.5 x 0.7 cm.  The cortex is within
normal limits for thickness, measuring 2.6 mm and the cortex is
normal in echogenicity.  There is a preserved fatty hilum.  This
lymph node corresponds in expected position and maximum diameter to
that seen on the recent MRI.  Additional survey of the right axilla
shows no suspicious lymph nodes.
IMPRESSION: 1.  No suspicious lymph nodes are identified on ultrasound.  The
mildly asymmetric lymph node in the lower axilla/axillary tail
identified on recent MRI is within normal limits for size, cortical
thickness, and echogenicity and has a preserved fatty hilum.  No
biopsy is indicated.
2.  Complex fluid collection in the upper right breast is
compatible with postoperative seroma and/or hematoma.  The
generalized asymmetric enhancement of the right breast compared to
the left, particularly in the upper central right breast is favored
to be due to postoperative/post stereotactic biopsy changes.  No
suspicious findings are seen by ultrasound. A follow-up breast MRI
in 6-12 months could be considered to reevaluate this asymmetric
enhancement.

BI-RADS CATEGORY 6:  Known biopsy-proven malignancy - appropriate
action should be taken.

## 2010-06-01 ENCOUNTER — Other Ambulatory Visit: Payer: Self-pay | Admitting: Oncology

## 2010-06-01 ENCOUNTER — Encounter (HOSPITAL_BASED_OUTPATIENT_CLINIC_OR_DEPARTMENT_OTHER): Payer: Medicare Other | Admitting: Oncology

## 2010-06-01 DIAGNOSIS — C50419 Malignant neoplasm of upper-outer quadrant of unspecified female breast: Secondary | ICD-10-CM

## 2010-06-01 DIAGNOSIS — M25519 Pain in unspecified shoulder: Secondary | ICD-10-CM

## 2010-06-01 LAB — CBC WITH DIFFERENTIAL/PLATELET
Basophils Absolute: 0 10*3/uL (ref 0.0–0.1)
Eosinophils Absolute: 0.1 10*3/uL (ref 0.0–0.5)
HGB: 11.8 g/dL (ref 11.6–15.9)
MCV: 94.2 fL (ref 79.5–101.0)
MONO#: 0.4 10*3/uL (ref 0.1–0.9)
MONO%: 9.7 % (ref 0.0–14.0)
NEUT#: 2.8 10*3/uL (ref 1.5–6.5)
RBC: 3.65 10*6/uL — ABNORMAL LOW (ref 3.70–5.45)
RDW: 12.9 % (ref 11.2–14.5)
WBC: 4.3 10*3/uL (ref 3.9–10.3)
lymph#: 1 10*3/uL (ref 0.9–3.3)
nRBC: 0 % (ref 0–0)

## 2010-06-01 LAB — COMPREHENSIVE METABOLIC PANEL
ALT: 13 U/L (ref 0–35)
Albumin: 4.2 g/dL (ref 3.5–5.2)
CO2: 26 mEq/L (ref 19–32)
Chloride: 104 mEq/L (ref 96–112)
Glucose, Bld: 80 mg/dL (ref 70–99)
Potassium: 3.5 mEq/L (ref 3.5–5.3)
Sodium: 141 mEq/L (ref 135–145)
Total Bilirubin: 1 mg/dL (ref 0.3–1.2)
Total Protein: 7.1 g/dL (ref 6.0–8.3)

## 2010-06-08 NOTE — Op Note (Signed)
NAMEJOVAN, SCHICKLING                ACCOUNT NO.:  [id number]   MEDICAL RECORD NO.:  [id number]          PATIENT TYPE:  AMB   LOCATION:  SDS                          FACILITY:  [facility]   PHYSICIAN:  Ollen Gross. Vernell Morgans, M.D. DATE OF BIRTH:  25-Mar-1940   DATE OF PROCEDURE:  05/23/2008  DATE OF DISCHARGE:  05/23/2008                               OPERATIVE REPORT   PREOPERATIVE DIAGNOSIS:  Right breast mass.   POSTOPERATIVE DIAGNOSIS:  Right breast mass.   PROCEDURE:  Right breast needle-localized lumpectomy.   SURGEON:  Ollen Gross. Vernell Morgans, MD   ANESTHESIA:  General.   PROCEDURE:  After informed consent was obtained, the patient was brought  to the operating room and placed in a supine position on the operating  table.  After induction of general anesthesia, the patient's right  breast was prepped with Betadine and draped in usual sterile manner.  Earlier in the day, the patient had undergone a wire localization  procedure and the wire was entering the breast superiorly at about the  11 o'clock position and aimed inferiorly.  A curvilinear incision was  made just beneath the entry site of the wire transversely.  This  incision was carried down through the skin and subcutaneous tissue  sharply with the electrocautery.  Once into the breast tissue, the path  of the wire was able to be palpated.  A circular portion of breast  tissue around the wire was excised sharply with the electrocautery.  This dissection was carried all the way down to the chest wall.  Once  this was accomplished, the specimen was oriented with short stitch  superior, long stitch lateral, and the wire was entering the specimen in  the superior and anterior position.  It was then sent for specimen  radiograph, which showed the clip to be in the center of the specimen,  and the specimen looked good.  It was then sent to pathology for further  evaluation.  Hemostasis was achieved using the Bovie electrocautery.  The wound  was infiltrated with 0.25% Marcaine and irrigated with copious  amounts of saline.  The deep layer of the wound was closed with  interrupted 3-0 Vicryl stitches and the skin was closed with a running 4-  0 Monocryl subcuticular stitch.  A Dermabond dressing was applied.  The  patient tolerated the procedure well.  At the end of the case, all  needle, sponge, and instrument counts were correct.  The patient was  then awakened and taken to recovery room in stable condition.      Ollen Gross. Vernell Morgans, M.D.  Electronically Signed     PST/MEDQ  D:  05/23/2008  T:  05/24/2008  Job:  [id number]

## 2010-06-08 NOTE — Op Note (Signed)
Alison [name], Alison [name]                ACCOUNT NO.:  [id number]   MEDICAL RECORD NO.:  [id number]          PATIENT TYPE:  OIB   LOCATION:  5149                         FACILITY:  [facility]   PHYSICIAN:  Ollen Gross. Vernell Morgans, M.D. DATE OF BIRTH:  1940/10/16   DATE OF PROCEDURE:  06/18/2008  DATE OF DISCHARGE:                               OPERATIVE REPORT   PREOPERATIVE DIAGNOSES:  Right breast cancer.   POSTOPERATIVE DIAGNOSIS:  Right breast cancer.   PROCEDURE:  Right sentinel node biopsy with injection of blue dye and  subsequent axillary lymph node dissection.   SURGEON:  Ollen Gross. Vernell Morgans, MD   ANESTHESIA:  General via LMA.   PROCEDURE IN DETAIL:  After informed consent was obtained, the patient  was brought to the operating room and placed in supine position on the  operating room table.  After adequate induction of general anesthesia,  the patient's right breast, chest, and axilla were prepped with Betadine  and draped in the usual sterile manner.  Earlier in the day, the patient  had undergone injection of 1 mCi of technetium sulfur colloid in the  subareolar position.  At this point, 2 mL of methylene blue and 3 mL of  injectable saline were also injected in the subareolar position and the  breast was massaged for several minutes.  A NeoProbe was used to  identify a hot spot in the right axilla.  A transverse incision was made  overlying this hot spot with a #15 blade knife.  This incision was  carried down through the skin and subcutaneous tissue sharply with the  electrocautery until the axilla was entered.  A Weitlaner retractor was  deployed.  Using the NeoProbe to guide the dissection, blunt dissection  was carried out until a hot lymph node was identified.  This was excised  sharply with the electrocautery.  It was sent as sentinel node #1.  Ex  vivo counts were around 100.  Because the node did not map as strongly  as we had anticipated, we did do some other blunt dissection  in this  area and found another palpable lymph node that was enlarged.  This was  also removed sharply with the electrocautery and sent as a nonsentinel  node 1.  Touch preps on the first sentinel node were negative but touch  preps on the first nonsentinel node were positive for metastatic breast  cancer.  At this point, we opened the axillary incision anteriorly and  posteriorly a little more with the #15 blade knife and electrocautery.  The dissection was started in the subcutaneous tissue and carried down  to the latissimus muscle posteriorly.  We also were able to identify the  chest wall medially and using some sharp dissection with the  electrocautery and blunt dissection with the right-angle superiorly we  were able to identify the axillary vein.  The fatty contents of the  axilla within these boundaries were then teased away bluntly with a  right-angle clamp.  Couple of small vessels and intercostobrachial  nerves were controlled with clips.  The long thoracic and  thoracodorsal  nerves were identified and kept out of the dissection to preserve them.  Once all this was accomplished, the right axillary contents were removed  from the patient and sent en bloc for further evaluation to pathology.  The wound was then irrigated with copious amounts of saline.  The wound  was examined and found to be hemostatic.  A small stab incision was made  in the mid axillary line beneath the operative bed with a #15 blade  knife.  A tonsil clamp was placed through this opening into the  operative bed and used to bring up the 19-French round Blake drain into  the operative bed, now the skin.  The drain was anchored to the skin  with 3-0 nylon stitch and the drain tip was placed up in the axilla.  At  this point, the deep layer of the wound was closed with interrupted 3-0  Vicryl stitches, then the skin was closed with a running 4-0  Monocryl subcuticular stitch.  A Dermabond dressing was applied.   The  bulb was placed to suction and there was a good seal.  The patient  tolerated the procedure well.  At the end of the case, all needle,  sponge, and instrument counts were correct.  The patient was then  awakened and taken to the recovery room in stable condition.      Ollen Gross. Vernell Morgans, M.D.  Electronically Signed     PST/MEDQ  D:  06/18/2008  T:  06/19/2008  Job:  [id number]

## 2010-06-08 NOTE — Op Note (Signed)
NAMEELIZABEHT, SUTO                ACCOUNT NO.:  [id number]   MEDICAL RECORD NO.:  [id number]          PATIENT TYPE:  AMB   LOCATION:  DSC                          FACILITY:  [facility]   PHYSICIAN:  Ollen Gross. Vernell Morgans, M.D. DATE OF BIRTH:  May 15, 1940   DATE OF PROCEDURE:  07/14/2008  DATE OF DISCHARGE:                               OPERATIVE REPORT   PREOPERATIVE DIAGNOSIS:  Right breast cancer.   POSTOPERATIVE DIAGNOSIS:  Right breast cancer.   PROCEDURE:  Placement of left internal jugular vein Port-A-Cath.   SURGEON:  Ollen Gross. Vernell Morgans, MD   ANESTHESIA:  General via LMA.   PROCEDURE:  After informed consent was obtained, the patient was brought  to the operating room, placed in supine position on operating room  table.  After adequate induction of general anesthesia, the patient's  left neck and chest area were prepped with Betadine and draped in usual  sterile manner.  The patient was placed in Trendelenburg position using  a small 22-gauge needle.  The left internal jugular vein was able to be  identified and this was removed.  Pressure was held using a large bore  needle.  From the Port-A-Cath kit, the left internal jugular vein was  again accessed without difficulty.  A wire was then placed through the  needle using the Seldinger technique without difficulty.  The wire was  confirmed in the central venous system using real-time fluoroscopy.  Next, a small incision was made just lateral to the bend of the clavicle  on the left chest with a 15-blade knife.  This incision was carried down  through the skin and subcutaneous tissue sharply with the  electrocautery.  Using electrocautery and blunt finger dissection, the  subcutaneous pocket was created on the left chest wall.  Next, a  hemostat was used to connect the incision on the neck to the incision on  the chest tunneling subcutaneously over the clavicle.  A curved  tunneling device was then used to thread the Port-A-Cath tubing  along  the tunnel.  The tubing was attached to the well of the port, which was  placed in the subcutaneous pocket.  The length of the tubing was then  estimated using real-time fluoroscopy.  The tubing was cut to about 23  cm or so using a sheath and dilator.  This was placed over the wire  using Seldinger technique without difficulty.  The wire and dilator were  then removed.  A thumb was placed over the opening of the sheath.  Next,  the tubing was then fed through the sheath as far as it would go and the  sheath was cracked and gently separated keeping the tubing in place.  Once this was accomplished, fluoro image was obtained that showed the  tip of the catheter being in the superior vena cava.  The tubing was  then anchored to the well.  Using the clear anchor from the kit, the  well was then anchored in the subcutaneous pocket with two interrupted 2-  0 Prolene stitches.  The port aspirated blood easily and was  flushed  initially with a dilute heparin solution, then with a more concentrated  heparin solution.  On the chest wall incision, the deeper layer was  closed with interrupted 3-0 Vicryl stitches and the skin was closed with  running 4-0 Monocryl subcuticular stitch.  The incision at the neck was  closed with a single interrupted subcuticular 4-0 Monocryl stitch.  Dermabond dressings were then applied.  The patient tolerated the  procedure well.  At the end of the case, all needle, sponge and  instrument counts were correct.  The patient was then awakened, taken to  recovery room in stable condition.      Ollen Gross. Vernell Morgans, M.D.  Electronically Signed     PST/MEDQ  D:  07/14/2008  T:  07/15/2008  Job:  [id number]

## 2010-06-11 NOTE — Op Note (Signed)
[facility]  Patient:    Alison [name], Alison [name]                       MRN: [id number] Proc. Date: 06/17/99 Adm. Date:  [id number] Disc. Date: [id number] Attending:  Maxie Better CC:         Lowell C. Catha Gosselin, M.D.                           Operative Report  PREOPERATIVE DIAGNOSIS:       Endometrial polyps.  Postmenopausal patient on Tamoxifen therapy.  POSTOPERATIVE DIAGNOSIS:      Endometrial polyps.  Posterior submucosal fibroid.  OPERATION:                    Diagnostic hysteroscopy, resection of endometrial  polyps, and submucosal fibroids.  Dilatation and curettage.  SURGEON:                      Sheronette A. Cherly Hensen, M.D.  ASSISTANT:  ANESTHESIA:                   General anesthesia.  ESTIMATED BLOOD LOSS:  INDICATIONS:                  This is a 70 year old postmenopausal female with  history of breast cancer on Tamoxifen therapy who was found on endometrial evaluation of the ultrasound to have several endometrial masses suggestive of endometrial polyps.  The patient was counseled.  Recommendation was made for a &C and hysteroscopy.  The risks and benefits of the procedure had been explained.  Consent was signed and the patient was transferred to the operating room.  DESCRIPTION OF PROCEDURE:     Under adequate general anesthesia, the patient was placed in the dorsal lithotomy position.  Due to her right knee containing fluid, the patient was positioned in the [place] stirrups while awake.  Examination under anesthesia revealed a bulky anteverted uterus, no adnexal masses could be appreciated.  The patient was sterilely prepped and draped in the usual fashion. The bladder was catheterized for a large amount of urine.  A bivalve speculum was placed in the vagina.  Single tooth tenaculum was placed on the anterior lip of the cervix.  There was no vaginal lesions.  The cervix was without any lesions noted. The cervical os was then  serially dilated up to a #31 Pratt dilator.  A Sorbitol primed resectoscope was introduced into the uterine cavity.  Panoramic view was  noted.  Of note, multiple polypoid lesions were noted and the left anterior and  right anterior wall of the uterus.  The right tubal ostia could be seen.  The left tubal ostia was obscured by a polypoid lesion.  There was a raised lesion on the posterior aspect of the uterus suggestive of a large polyp or a submucosal fibroid. Using the resectoscope, single loop, the endometrial polyps were resected.  The  posterior mass was resected and the pieces were most suggestive of a fibroid. ith removal of the resectoscope and the pieces removed, this cavity explored using he polyp forceps, and parts of tissue continued to be removed, the resectoscope was then reinserted.  Continued resection of the posterior presumed submucosal fibroid continued.  The single loop was then replaced by the double loop resectoscope endoloop and continued resection was then made.  During the resection of the posterior submucosal fibroid, it  was noted that there was also a polypoid mass proximal to this mass and that was also removed, suggestive also for polypoid lesion.  The posterior fibroid was resected until it reached the level of the remaining portion of the uterine wall at which time, the cavity was then further reinspected.  All polypoid lesions had been removed.  The posterior wall was leveled in conjunction with the remaining portion of the uterine cavity.  The resectoscope was then removed.  The endocervical canal was inspected and no masses noted.  With the resectoscope removed, the polyp forceps were then used to remove additional tissue.  The uterine cavity was then curetted for pieces of the resected tissue as well as endometrial curetting.  The procedure was then terminated by removing all instruments from the vagina.  Specimen labled  endometrial curetting, endometrial polyp, portions of the submucosal fibroid were all sent to pathology. Estimated blood loss was about 100 cc. Sorbitol deficit was 200 cc. Complications were none.  The patient tolerated the procedure well and was transferred to the  recovery room in stable condition. DD:  06/17/99 TD:  06/20/99 Job: [id number]

## 2010-06-25 ENCOUNTER — Ambulatory Visit (HOSPITAL_COMMUNITY)
Admission: RE | Admit: 2010-06-25 | Discharge: 2010-06-25 | Disposition: A | Discharge status: Home / Self Care | Payer: Medicare Other | Source: Ambulatory Visit | Attending: Oncology | Admitting: Oncology

## 2010-06-25 DIAGNOSIS — Z853 Personal history of malignant neoplasm of breast: Secondary | ICD-10-CM | POA: Insufficient documentation

## 2010-06-25 DIAGNOSIS — Z9889 Other specified postprocedural states: Secondary | ICD-10-CM

## 2010-06-25 MED ORDER — GADOBENATE DIMEGLUMINE 529 MG/ML IV SOLN
20.0000 mL | Freq: Once | INTRAVENOUS | Status: AC | PRN
  Administered 2010-06-25: 20 mL via INTRAVENOUS

## 2010-07-13 ENCOUNTER — Encounter (HOSPITAL_BASED_OUTPATIENT_CLINIC_OR_DEPARTMENT_OTHER): Payer: Medicare Other | Admitting: Oncology

## 2010-07-13 DIAGNOSIS — M25519 Pain in unspecified shoulder: Secondary | ICD-10-CM

## 2010-07-13 DIAGNOSIS — C50419 Malignant neoplasm of upper-outer quadrant of unspecified female breast: Secondary | ICD-10-CM

## 2010-08-04 ENCOUNTER — Encounter: Payer: Self-pay | Admitting: *Deleted

## 2010-09-22 ENCOUNTER — Other Ambulatory Visit: Payer: Self-pay | Admitting: Oncology

## 2010-09-22 ENCOUNTER — Encounter (HOSPITAL_BASED_OUTPATIENT_CLINIC_OR_DEPARTMENT_OTHER): Payer: Medicare Other | Admitting: Oncology

## 2010-09-22 DIAGNOSIS — C50419 Malignant neoplasm of upper-outer quadrant of unspecified female breast: Secondary | ICD-10-CM

## 2010-09-22 DIAGNOSIS — M25519 Pain in unspecified shoulder: Secondary | ICD-10-CM

## 2010-09-22 DIAGNOSIS — Z452 Encounter for adjustment and management of vascular access device: Secondary | ICD-10-CM

## 2010-09-22 LAB — CBC WITH DIFFERENTIAL/PLATELET
Basophils Absolute: 0 10*3/uL (ref 0.0–0.1)
Eosinophils Absolute: 0 10*3/uL (ref 0.0–0.5)
HGB: 12.3 g/dL (ref 11.6–15.9)
MCV: 96.5 fL (ref 79.5–101.0)
MONO%: 9.1 % (ref 0.0–14.0)
NEUT#: 3.2 10*3/uL (ref 1.5–6.5)
Platelets: 195 10*3/uL (ref 145–400)
RDW: 13.8 % (ref 11.2–14.5)

## 2010-09-22 LAB — COMPREHENSIVE METABOLIC PANEL
Albumin: 4.3 g/dL (ref 3.5–5.2)
Alkaline Phosphatase: 74 U/L (ref 39–117)
BUN: 16 mg/dL (ref 6–23)
CO2: 26 mEq/L (ref 19–32)
Calcium: 9.4 mg/dL (ref 8.4–10.5)
Chloride: 103 mEq/L (ref 96–112)
Glucose, Bld: 83 mg/dL (ref 70–99)
Potassium: 3.5 mEq/L (ref 3.5–5.3)

## 2010-10-11 ENCOUNTER — Encounter: Payer: Medicare Other | Admitting: Gynecology

## 2010-11-17 ENCOUNTER — Encounter: Payer: Medicare Other | Admitting: Gynecology

## 2010-11-26 ENCOUNTER — Encounter (HOSPITAL_BASED_OUTPATIENT_CLINIC_OR_DEPARTMENT_OTHER): Payer: Medicare Other | Admitting: Oncology

## 2010-11-26 DIAGNOSIS — Z452 Encounter for adjustment and management of vascular access device: Secondary | ICD-10-CM

## 2010-11-26 DIAGNOSIS — C50419 Malignant neoplasm of upper-outer quadrant of unspecified female breast: Secondary | ICD-10-CM

## 2010-12-01 ENCOUNTER — Encounter: Payer: Medicare Other | Admitting: Gynecology

## 2010-12-22 ENCOUNTER — Ambulatory Visit (INDEPENDENT_AMBULATORY_CARE_PROVIDER_SITE_OTHER): Payer: Medicare Other | Admitting: Gynecology

## 2010-12-22 ENCOUNTER — Encounter: Payer: Self-pay | Admitting: Gynecology

## 2010-12-22 VITALS — BP 120/76 | Ht 63.25 in | Wt 183.5 lb

## 2010-12-22 DIAGNOSIS — B029 Zoster without complications: Secondary | ICD-10-CM | POA: Insufficient documentation

## 2010-12-22 DIAGNOSIS — N952 Postmenopausal atrophic vaginitis: Secondary | ICD-10-CM

## 2010-12-22 DIAGNOSIS — L293 Anogenital pruritus, unspecified: Secondary | ICD-10-CM

## 2010-12-22 DIAGNOSIS — L940 Localized scleroderma [morphea]: Secondary | ICD-10-CM

## 2010-12-22 DIAGNOSIS — L900 Lichen sclerosus et atrophicus: Secondary | ICD-10-CM

## 2010-12-22 DIAGNOSIS — N898 Other specified noninflammatory disorders of vagina: Secondary | ICD-10-CM

## 2010-12-22 DIAGNOSIS — R82998 Other abnormal findings in urine: Secondary | ICD-10-CM

## 2010-12-22 MED ORDER — CLOBETASOL PROPIONATE 0.05 % EX CREA: TOPICAL_CREAM | CUTANEOUS | Status: AC

## 2010-12-22 NOTE — Progress Notes (Signed)
AASHI DERRINGTON [birth year] [id number]        70 y.o.  for follow up. Several issues as noted below.  Past medical history,surgical history, medications, allergies, family history and social history were all reviewed and documented in the EPIC chart. ROS:  Was performed and pertinent positives and negatives are included in the history.  Exam: chaperone present Filed Vitals:   12/22/10 0931  BP: 120/76   General appearance  Normal Skin grossly normal Head/Neck normal with no cervical or supraclavicular adenopathy thyroid normal Lungs  clear Cardiac RR, without RMG Abdominal  soft, nontender, without masses, organomegaly or hernia Breasts  examined lying and sitting without masses, retractions, discharge or axillary adenopathy. Well-healed lumpectomy scars bilaterally Pelvic  Ext/BUS/vagina  Symmetrical vitiligo periclitoral down labium minora and majora to the perianal region. Overall atrophic changes noted. 2 small left lateral vaginal sidewall submucosal cysts.  Cervix  normal and atrophic  Uterus  axial, normal size, shape and contour, midline and mobile nontender   Adnexa  Without masses or tenderness    Anus and perineum  normal   Rectovaginal  normal sphincter tone without palpated masses or tenderness.    Assessment/Plan:  70 y.o. female for annual exam.    1. Lichen sclerosus/atrophic vaginal changes. Exam stable from past exams. She does have biopsy-proven lichen sclerosus. I refilled her Temovate 0.05% cream to use when necessary for itching. 2. Vaginal cyst. Very benign-appearing small cysts left lateral vaginal wall. Never noticed these before and I recommended she return in 3-6 months just to reexamine her to make sure they're stable and she agrees with this. 3. Pap smear. She has no history of abnormal Pap smears in the past with several normal reports in her chart. Last Pap smear was 2011. I discussed current guidelines. She's over 65 and the options of stopping altogether  or doing less frequent screening reviewed. I did not do a Pap smear today and we'll readdress this on an annual basis. 4. Breast health. She has a history of breast cancer x2. SBE monthly discussed. Had breast MRI in June 2012 and mammography in January 2012.  Patient will continue to follow up with her oncologist and screening per their recommendation. 5. Bone health. Last bone density was 2010 at her oncology office and she will continue to follow up with them for this. 6. Colonoscopy she is due next year and she knows to schedule this. 7. Health maintenance. No blood work was done today is all done through her primary and oncology office. She'll continue to follow up with them per their recommendation. She'll see me in 3-6 months in follow up of her vaginal cyst.    Dara Lords MD, 9:59 AM 12/22/2010

## 2010-12-22 NOTE — Progress Notes (Signed)
Addended byCammie Mcgee T on: 12/22/2010 01:09 PM   Modules accepted: Orders

## 2011-01-19 ENCOUNTER — Encounter: Payer: Self-pay | Admitting: *Deleted

## 2011-01-21 ENCOUNTER — Other Ambulatory Visit (HOSPITAL_BASED_OUTPATIENT_CLINIC_OR_DEPARTMENT_OTHER): Payer: Medicare Other | Admitting: Lab

## 2011-01-21 ENCOUNTER — Other Ambulatory Visit: Payer: Self-pay | Admitting: Oncology

## 2011-01-21 ENCOUNTER — Ambulatory Visit: Payer: Medicare Other

## 2011-01-21 ENCOUNTER — Other Ambulatory Visit: Payer: Self-pay

## 2011-01-21 ENCOUNTER — Ambulatory Visit (HOSPITAL_BASED_OUTPATIENT_CLINIC_OR_DEPARTMENT_OTHER): Payer: Medicare Other | Admitting: Oncology

## 2011-01-21 VITALS — BP 130/71 | HR 66 | Temp 97.5°F | Ht 64.0 in | Wt 186.0 lb

## 2011-01-21 DIAGNOSIS — Z8619 Personal history of other infectious and parasitic diseases: Secondary | ICD-10-CM

## 2011-01-21 DIAGNOSIS — C50419 Malignant neoplasm of upper-outer quadrant of unspecified female breast: Secondary | ICD-10-CM

## 2011-01-21 DIAGNOSIS — Z170 Estrogen receptor positive status [ER+]: Secondary | ICD-10-CM

## 2011-01-21 DIAGNOSIS — C50919 Malignant neoplasm of unspecified site of unspecified female breast: Secondary | ICD-10-CM

## 2011-01-21 DIAGNOSIS — R52 Pain, unspecified: Secondary | ICD-10-CM

## 2011-01-21 LAB — CBC WITH DIFFERENTIAL/PLATELET
Basophils Absolute: 0 10*3/uL (ref 0.0–0.1)
EOS%: 1.4 % (ref 0.0–7.0)
Eosinophils Absolute: 0.1 10*3/uL (ref 0.0–0.5)
HCT: 34.4 % — ABNORMAL LOW (ref 34.8–46.6)
HGB: 11.9 g/dL (ref 11.6–15.9)
LYMPH%: 12.6 % — ABNORMAL LOW (ref 14.0–49.7)
MCH: 33.4 pg (ref 25.1–34.0)
MCV: 96.9 fL (ref 79.5–101.0)
MONO%: 12.4 % (ref 0.0–14.0)
NEUT#: 3.5 10*3/uL (ref 1.5–6.5)
NEUT%: 73.2 % (ref 38.4–76.8)
Platelets: 209 10*3/uL (ref 145–400)

## 2011-01-21 LAB — COMPREHENSIVE METABOLIC PANEL
ALT: 13 U/L (ref 0–35)
Albumin: 4.2 g/dL (ref 3.5–5.2)
CO2: 30 mEq/L (ref 19–32)
Calcium: 9.4 mg/dL (ref 8.4–10.5)
Chloride: 101 mEq/L (ref 96–112)
Glucose, Bld: 88 mg/dL (ref 70–99)
Sodium: 140 mEq/L (ref 135–145)
Total Bilirubin: 0.9 mg/dL (ref 0.3–1.2)
Total Protein: 7 g/dL (ref 6.0–8.3)

## 2011-01-21 MED ORDER — AMITRIPTYLINE HCL 25 MG PO TABS: 25.0000 mg | ORAL_TABLET | Freq: Every day | ORAL | Status: DC

## 2011-01-21 MED ORDER — SODIUM CHLORIDE 0.9 % IJ SOLN
10.0000 mL | INTRAMUSCULAR | Status: DC | PRN
  Administered 2011-01-21: 10 mL via INTRAVENOUS
  Filled 2011-01-21: qty 10

## 2011-01-21 MED ORDER — HEPARIN SOD (PORK) LOCK FLUSH 100 UNIT/ML IV SOLN
500.0000 [IU] | Freq: Once | INTRAVENOUS | Status: AC
  Administered 2011-01-21: 500 [IU] via INTRAVENOUS
  Filled 2011-01-21: qty 5

## 2011-01-21 MED ORDER — LIDOCAINE-PRILOCAINE 2.5-2.5 % EX CREA: TOPICAL_CREAM | Freq: Three times a day (TID) | CUTANEOUS | Status: AC | PRN

## 2011-01-21 NOTE — Telephone Encounter (Signed)
appts made and printed  for 02/17/11 for bone density,03/18/11 for flush,03/28/11 mammo and 05/13/11 for lab,md and flush     aom

## 2011-01-22 NOTE — Progress Notes (Signed)
OFFICE PROGRESS NOTE Date of Visit:  Jan 21, 2011 Physicians:  L.Nyland, S.Norris, P.Carolynne Edouard, T.Fontaine  INTERVAL HISTORY:   Patient is seen, alone for visit today, in scheduled follow up of her history of bilateral breast carcinoma, most recently T1bN2 (6 of 23 nodes) grade 1 invasive ductal carcinoma of right breast ER/PR + and HER2 - diagnosed spring 2010. She had adjuvant taxotere/cytoxan, local radiation and continues aromatase inhibtor which was begun in Nov.2010. Past history is significant for T2 left breast cancer with micrometastatic involvement of sentinel node in 1998, treated with adjuvant adria/cytoxan/taxol, radiation and 2 1/2 years of tamoxifen. Last bone density scan was at [facility] in Nov 2010 and was normal; we will schedule repeat bone density now. Last bilateral mammograms were at [facility], 2012 and last breast MRI June 25, 2010. She still has PAC in place which is flushed q 6-8 weeks; note peripheral venous access is limited as she has had bilateral axillary dissections and radiation.  Patient reports that she had shingles on left mid abdomen/ back in October. She was seen by primary MD 3 -4 days after onset of rash, treated with acyclovir but has ongoing post-herpetic pain. The discomfort is worse at night, described as itching and significant burning pain. She has tried some pain medication without improvement, has difficulty sleeping because of the discomfort. We have discussed amitriptyline or gabapentin; we will begin with amitriptyline 25 mg at hs and she will be back in touch here or with Dr.Nyland's office if this is not helpful in ~ a week. She can also try topical EMLA cream.   Review of Systems otherwise: some allergic sinus congestion, no other respiratory symptoms. No new or different GI, cardiac, neuro, bladder, musculoskeletal, hematologic symptoms. Right knee degenerative joint disease very bothersome; orthopedics previously discussed knee replacement.   Remainder of 10 point Review of Systems negative.   Objective:  Vital signs in last 24 hours:  BP 130/71  Pulse 66  Temp(Src) 97.5 F (36.4 C) (Oral)  Ht 5\' 4"  (1.626 m)  Wt 186 lb (84.369 kg)  BMI 31.93 kg/m2  LMP 01/25/2004  Alert, looks tired and uncomfortable from zoster symptoms.  HEENT:mucous membranes moist, pharynx normal without lesions LymphaticsCervical, supraclavicular, and axillary nodes normal. Resp: clear to auscultation bilaterally and normal percussion bilaterally Cardio: regular rate and rhythm GI: soft, non-tender; bowel sounds normal; no masses,  no organomegaly Extremities: no LE edema or cords. Right knee no obvious effusion, no heat or erythema. No increased swelling either upper extremity. Skin: discoloration in dermatomal pattern left mid abdomen around to mid back, no vesicles, no apparent infection. Breasts bilaterally with lumpectomy scars, no dominant masses Portacath-without erythema or tenderness. For flush today  Lab Results:   [facility] 01/21/11 0846  WBC 4.8  HGB 11.9  HCT 34.4*  PLT 209  ANC 2.4 MCV 97.8  BMET/CMET  Basename 01/21/11 0846  NA 140  K 3.4*  CL 101  CO2 30  GLUCOSE 88  BUN 20  CREATININE 0.74  CALCIUM 9.4   Remainder of full CMET WNL Studies/Results:  No results found.  Medications: I have reviewed the patient's current medications. She has had flu vaccine. Assessment/Plan:  1. History of bilateral breast caarcinoma with history as above: continue arimidex. Needs repeat bone density scan particularly as she is on high risk medication arimidex. Mammograms at [facility] in March. I will see her back in 4 mo, coordinating with PAC flush. 2.PAC in place, flush in 2 mo and  55mo 3.Recent zoster with post herpetic neuralgia: trial of amitriptyline as above 4. Due colonoscopy in March 2014 by Dr.Dora Juanda Chance, per my conversation with that office now        Reece Packer, MD   01/22/2011, 8:50  PM

## 2011-01-27 ENCOUNTER — Other Ambulatory Visit: Payer: Self-pay

## 2011-01-27 NOTE — Telephone Encounter (Signed)
ENCOUNTER OPENED IN ERROR

## 2011-02-17 ENCOUNTER — Other Ambulatory Visit: Payer: Medicare Other

## 2011-02-24 ENCOUNTER — Ambulatory Visit
Admission: RE | Admit: 2011-02-24 | Discharge: 2011-02-24 | Disposition: A | Discharge status: Home / Self Care | Payer: Medicare Other | Source: Ambulatory Visit | Attending: Oncology | Admitting: Oncology

## 2011-02-24 DIAGNOSIS — C50919 Malignant neoplasm of unspecified site of unspecified female breast: Secondary | ICD-10-CM

## 2011-02-25 ENCOUNTER — Other Ambulatory Visit: Payer: Medicare Other

## 2011-03-09 ENCOUNTER — Encounter: Payer: Self-pay | Admitting: Oncology

## 2011-03-09 NOTE — Progress Notes (Signed)
[facility], normal.

## 2011-03-21 ENCOUNTER — Ambulatory Visit (HOSPITAL_BASED_OUTPATIENT_CLINIC_OR_DEPARTMENT_OTHER): Payer: Medicare Other

## 2011-03-21 DIAGNOSIS — Z469 Encounter for fitting and adjustment of unspecified device: Secondary | ICD-10-CM

## 2011-03-21 DIAGNOSIS — C50919 Malignant neoplasm of unspecified site of unspecified female breast: Secondary | ICD-10-CM

## 2011-03-21 MED ORDER — HEPARIN SOD (PORK) LOCK FLUSH 100 UNIT/ML IV SOLN
500.0000 [IU] | Freq: Once | INTRAVENOUS | Status: AC
  Administered 2011-03-21: 500 [IU] via INTRAVENOUS
  Filled 2011-03-21: qty 5

## 2011-03-21 MED ORDER — SODIUM CHLORIDE 0.9 % IJ SOLN
10.0000 mL | INTRAMUSCULAR | Status: DC | PRN
  Administered 2011-03-21: 10 mL via INTRAVENOUS
  Filled 2011-03-21: qty 10

## 2011-03-28 ENCOUNTER — Ambulatory Visit
Admission: RE | Admit: 2011-03-28 | Discharge: 2011-03-28 | Disposition: A | Discharge status: Home / Self Care | Payer: Medicare Other | Source: Ambulatory Visit | Attending: Oncology | Admitting: Oncology

## 2011-03-28 ENCOUNTER — Ambulatory Visit (INDEPENDENT_AMBULATORY_CARE_PROVIDER_SITE_OTHER): Payer: Medicare Other | Admitting: Gynecology

## 2011-03-28 ENCOUNTER — Encounter: Payer: Self-pay | Admitting: Gynecology

## 2011-03-28 DIAGNOSIS — B029 Zoster without complications: Secondary | ICD-10-CM

## 2011-03-28 DIAGNOSIS — L900 Lichen sclerosus et atrophicus: Secondary | ICD-10-CM

## 2011-03-28 DIAGNOSIS — B019 Varicella without complication: Secondary | ICD-10-CM

## 2011-03-28 DIAGNOSIS — N898 Other specified noninflammatory disorders of vagina: Secondary | ICD-10-CM

## 2011-03-28 DIAGNOSIS — C50919 Malignant neoplasm of unspecified site of unspecified female breast: Secondary | ICD-10-CM

## 2011-03-28 DIAGNOSIS — L940 Localized scleroderma [morphea]: Secondary | ICD-10-CM

## 2011-03-28 MED ORDER — VALACYCLOVIR HCL 1 G PO TABS: 1000.0000 mg | ORAL_TABLET | Freq: Two times a day (BID) | ORAL | Status: AC

## 2011-03-28 MED ORDER — HYDROXYZINE HCL 25 MG PO TABS: 25.0000 mg | ORAL_TABLET | Freq: Three times a day (TID) | ORAL | Status: AC | PRN

## 2011-03-28 NOTE — Progress Notes (Signed)
Patient presents with several issues. 1. Follow up of the 2 left lateral vaginal sidewall cysts. Asymptomatic and is here for follow up stability check. 2. Lichen sclerosus. Uses Temovate intermittently. 3. Varicella-zoster. Patient complaining of episode of shingles this past year has had some residual discomfort in the affected area on her left lateral abdomen to left flank region. Very itchy keeps her up at night.  Exam was Sherrilyn Rist chaperone present Abdomen/chest wall. Hypopigmented blotchy areas along the lateral dermatome consistent with her history of shingles outbreak previously. No active areas noted. Pelvic external with symmetrical vitiligo/lichen sclerosus changes from clitoral hood to perianal region bilaterally. Atrophic changes noted. Vagina atrophic changes with 2 small cysts lateral vaginal sidewall submucosal approximately 5 mm each. Cervix grossly normal. Uterus normal size midline mobile nontender. Adnexa without masses or tenderness  Assessment and plan: 1. Stable vaginal cyst. We'll continue with observation as she is asymptomatic. Is due for follow up in 6 months. 2. Lichen sclerosus. Patient is using Temovate intermittently. Gets relief and then symptoms returned after several weeks to months. I again encouraged her to use the Temovate intermittently for symptom relief. 3. Varicella-zoster. Residual symptoms. We'll give her a two-week trial of Valtrex 1 g twice a day.  Atarax 25 mg nightly #30 with 1 refill. Recommended shingles vaccine. Follow up with her primary if her shingles symptoms persist.

## 2011-03-28 NOTE — Patient Instructions (Signed)
#  1 use Temovate cream on the outside of her vagina as needed for itching. #2 take Valtrex for 2 weeks as prescribed. Take Atarax at bedtime as needed for itching. Recommend shingles vaccine. Follow up with your primary if your shingle symptoms persist.  Follow up with me in 6 months to 9 months for recheck.

## 2011-05-12 ENCOUNTER — Telehealth: Payer: Self-pay | Admitting: Oncology

## 2011-05-12 NOTE — Telephone Encounter (Signed)
Pr called appt to r/s from 4/19 to 4/30 , called Dr. Darrold Span RN and left message regarding change of schedule

## 2011-05-13 ENCOUNTER — Other Ambulatory Visit: Payer: Medicare Other | Admitting: Lab

## 2011-05-13 ENCOUNTER — Ambulatory Visit: Payer: Medicare Other | Admitting: Oncology

## 2011-05-23 ENCOUNTER — Telehealth: Payer: Self-pay | Admitting: Internal Medicine

## 2011-05-23 ENCOUNTER — Ambulatory Visit: Payer: Medicare Other

## 2011-05-23 ENCOUNTER — Ambulatory Visit (HOSPITAL_BASED_OUTPATIENT_CLINIC_OR_DEPARTMENT_OTHER): Payer: Medicare Other | Admitting: Lab

## 2011-05-23 ENCOUNTER — Ambulatory Visit (HOSPITAL_BASED_OUTPATIENT_CLINIC_OR_DEPARTMENT_OTHER): Payer: Medicare Other | Admitting: Physician Assistant

## 2011-05-23 VITALS — HR 71 | Temp 97.1°F

## 2011-05-23 VITALS — BP 130/68 | HR 69 | Temp 97.7°F | Ht 64.0 in | Wt 188.2 lb

## 2011-05-23 DIAGNOSIS — C50919 Malignant neoplasm of unspecified site of unspecified female breast: Secondary | ICD-10-CM

## 2011-05-23 DIAGNOSIS — Z79811 Long term (current) use of aromatase inhibitors: Secondary | ICD-10-CM

## 2011-05-23 DIAGNOSIS — Z170 Estrogen receptor positive status [ER+]: Secondary | ICD-10-CM

## 2011-05-23 LAB — CBC WITH DIFFERENTIAL/PLATELET
Basophils Absolute: 0 10*3/uL (ref 0.0–0.1)
EOS%: 1.7 % (ref 0.0–7.0)
MCH: 32.4 pg (ref 25.1–34.0)
MCHC: 33.2 g/dL (ref 31.5–36.0)
MCV: 97.5 fL (ref 79.5–101.0)
MONO%: 10.3 % (ref 0.0–14.0)
RBC: 3.69 10*6/uL — ABNORMAL LOW (ref 3.70–5.45)
RDW: 13.8 % (ref 11.2–14.5)

## 2011-05-23 LAB — COMPREHENSIVE METABOLIC PANEL
Alkaline Phosphatase: 72 U/L (ref 39–117)
Glucose, Bld: 94 mg/dL (ref 70–99)
Sodium: 140 mEq/L (ref 135–145)
Total Bilirubin: 1 mg/dL (ref 0.3–1.2)
Total Protein: 7 g/dL (ref 6.0–8.3)

## 2011-05-23 MED ORDER — HEPARIN SOD (PORK) LOCK FLUSH 100 UNIT/ML IV SOLN
500.0000 [IU] | Freq: Once | INTRAVENOUS | Status: AC
  Administered 2011-05-23: 500 [IU] via INTRAVENOUS
  Filled 2011-05-23: qty 5

## 2011-05-23 MED ORDER — SODIUM CHLORIDE 0.9 % IJ SOLN
10.0000 mL | INTRAMUSCULAR | Status: DC | PRN
  Administered 2011-05-23: 10 mL via INTRAVENOUS
  Filled 2011-05-23: qty 10

## 2011-05-23 NOTE — Telephone Encounter (Signed)
lmonvm re appts for June/aug. Schedule mailed.

## 2011-05-23 NOTE — Patient Instructions (Signed)
Patient to Dr. Precious Reel pod

## 2011-05-24 ENCOUNTER — Other Ambulatory Visit: Payer: Medicare Other | Admitting: Lab

## 2011-05-24 ENCOUNTER — Ambulatory Visit: Payer: Medicare Other

## 2011-05-24 ENCOUNTER — Ambulatory Visit: Payer: Medicare Other | Admitting: Oncology

## 2011-05-24 NOTE — Progress Notes (Signed)
OFFICE PROGRESS NOTE Date of Visit:  Jan 21, 2011 Physicians:  L.Nyland, S.Norris, P.Carolynne Edouard, T.Fontaine  INTERVAL HISTORY:   Patient is seen, alone for visit today, in scheduled follow up of her history of bilateral breast carcinoma, most recently T1bN2 (6 of 23 nodes) grade 1 invasive ductal carcinoma of right breast ER/PR + and HER2 - diagnosed spring 2010. She is status post adjuvant taxotere/cytoxan, local radiation and continues aromatase inhibtor which was begun in Nov.2010. Past history is significant for T2 left breast cancer with micrometastatic involvement of sentinel node in 1998, treated with adjuvant adria/cytoxan/taxol, radiation and 2 1/2 years of tamoxifen. Last bone density scan was at [facility] in 02/03/2011 and was normal; but with a 4% decrease in the lumbar spine and 8.5% decrease in the femur. we will schedule repeat bone density now. Last bilateral mammograms were at [facility] March 28, 2011 and was normal there was however a scattered fibroglandular parenchymal pattern, and last breast MRI June 25, 2010. She still has PAC in place which is flushed q 6-8 weeks; note peripheral venous access is limited as she has had bilateral axillary dissections and radiation.  She continues to complain of some itching and discomfort/mild pain at the site of her shingles outbreak from October. She also had some knee pain from a long known right degenerative joint disease. She was however able to enjoy [employer] with her family including all the blocking that was required. She is tolerating the Arimidex without difficulty  Review of Systems otherwise:  no  respiratory symptoms. No new or different GI, cardiac, neuro, bladder, musculoskeletal, hematologic symptoms. Right knee degenerative joint disease, known to orthopedics with previous discussions regarding knee replacement.   Remainder of 10 point Review of Systems negative.   Objective:  Vital signs in last 24 hours:  BP 130/68   Pulse 69  Temp(Src) 97.7 F (36.5 C) (Oral)  Ht 5\' 4"  (1.626 m)  Wt 188 lb 3.2 oz (85.367 kg)  BMI 32.30 kg/m2  LMP 01/25/2004  Alert, measures about the exam room without assistance and is in no acute distress  HEENT:mucous membranes moist, pharynx normal without lesions LymphaticsCervical, supraclavicular, and axillary nodes normal. Resp: clear to auscultation bilaterally and normal percussion bilaterally Cardio: regular rate and rhythm GI: soft, non-tender; bowel sounds normal; no masses,  no organomegaly Extremities: no LE edema or cords. Right knee no obvious effusion, no heat or erythema. No increased swelling either upper extremity. Breasts bilaterally with lumpectomy scars, no dominant masses, no skin changes of concern nothing palpable in either axilla Portacath-without erythema or tenderness.   Lab Results:   [facility] 05/23/11 0905  WBC 4.4  HGB 11.9  HCT 36.0  PLT 230  ANC 2.4 MCV 97.8  BMET/CMET  Basename 05/23/11 0905  NA 140  K 3.7  CL 103  CO2 29  GLUCOSE 94  BUN 19  CREATININE 0.59  CALCIUM 9.4   Remainder of full CMET WNL Studies/Results:  No results found.  Medications: I have reviewed the patient's current medications. She has had flu vaccine.  Assessment/Plan:  1. History of bilateral breast caarcinoma with history as above: continue arimidex. Patient discussed with Dr. Darrold Span today. Her labs were drawn from the Port-A-Cath today and the Port-A-Cath was flushed as per protocol. She'll followup with Dr. Melvyn Neth in 4 months with a repeat CBC differential and C. met drawn from her Port-A-Cath  2.PAC in place, flush in 2 mo and 32mo 3.continue post herpetic neuralgia  Tiana Loft E, PA-C   05/24/2011, 2:59 PM

## 2011-06-07 ENCOUNTER — Other Ambulatory Visit: Payer: Self-pay | Admitting: Physician Assistant

## 2011-07-18 ENCOUNTER — Ambulatory Visit (HOSPITAL_BASED_OUTPATIENT_CLINIC_OR_DEPARTMENT_OTHER): Payer: Medicare Other

## 2011-07-18 VITALS — BP 127/72 | HR 82 | Temp 97.6°F

## 2011-07-18 DIAGNOSIS — C50919 Malignant neoplasm of unspecified site of unspecified female breast: Secondary | ICD-10-CM

## 2011-07-18 DIAGNOSIS — Z452 Encounter for adjustment and management of vascular access device: Secondary | ICD-10-CM

## 2011-07-18 MED ORDER — HEPARIN SOD (PORK) LOCK FLUSH 100 UNIT/ML IV SOLN
500.0000 [IU] | Freq: Once | INTRAVENOUS | Status: AC
  Administered 2011-07-18: 500 [IU] via INTRAVENOUS
  Filled 2011-07-18: qty 5

## 2011-07-18 MED ORDER — SODIUM CHLORIDE 0.9 % IJ SOLN
10.0000 mL | INTRAMUSCULAR | Status: DC | PRN
  Administered 2011-07-18: 10 mL via INTRAVENOUS
  Filled 2011-07-18: qty 10

## 2011-08-18 ENCOUNTER — Other Ambulatory Visit: Payer: Self-pay | Admitting: Oncology

## 2011-08-18 DIAGNOSIS — C50419 Malignant neoplasm of upper-outer quadrant of unspecified female breast: Secondary | ICD-10-CM

## 2011-09-12 ENCOUNTER — Telehealth: Payer: Self-pay | Admitting: *Deleted

## 2011-09-12 ENCOUNTER — Ambulatory Visit (HOSPITAL_BASED_OUTPATIENT_CLINIC_OR_DEPARTMENT_OTHER): Payer: Medicare Other | Admitting: Oncology

## 2011-09-12 ENCOUNTER — Ambulatory Visit: Payer: Medicare Other

## 2011-09-12 ENCOUNTER — Other Ambulatory Visit: Payer: Self-pay | Admitting: Oncology

## 2011-09-12 ENCOUNTER — Telehealth: Payer: Self-pay | Admitting: Oncology

## 2011-09-12 ENCOUNTER — Encounter: Payer: Self-pay | Admitting: Oncology

## 2011-09-12 ENCOUNTER — Other Ambulatory Visit (HOSPITAL_BASED_OUTPATIENT_CLINIC_OR_DEPARTMENT_OTHER): Payer: Medicare Other | Admitting: Lab

## 2011-09-12 VITALS — BP 129/75 | HR 76 | Temp 96.8°F | Resp 20

## 2011-09-12 VITALS — BP 120/70 | HR 72 | Temp 98.0°F | Resp 20 | Wt 189.8 lb

## 2011-09-12 DIAGNOSIS — C50411 Malignant neoplasm of upper-outer quadrant of right female breast: Secondary | ICD-10-CM | POA: Insufficient documentation

## 2011-09-12 DIAGNOSIS — C50919 Malignant neoplasm of unspecified site of unspecified female breast: Secondary | ICD-10-CM

## 2011-09-12 DIAGNOSIS — M1710 Unilateral primary osteoarthritis, unspecified knee: Secondary | ICD-10-CM

## 2011-09-12 DIAGNOSIS — Z170 Estrogen receptor positive status [ER+]: Secondary | ICD-10-CM

## 2011-09-12 LAB — CBC WITH DIFFERENTIAL/PLATELET
Basophils Absolute: 0 10*3/uL (ref 0.0–0.1)
Eosinophils Absolute: 0.1 10*3/uL (ref 0.0–0.5)
HCT: 35.5 % (ref 34.8–46.6)
HGB: 12.1 g/dL (ref 11.6–15.9)
LYMPH%: 16.6 % (ref 14.0–49.7)
MCV: 97 fL (ref 79.5–101.0)
MONO#: 0.5 10*3/uL (ref 0.1–0.9)
MONO%: 10.5 % (ref 0.0–14.0)
NEUT#: 3.6 10*3/uL (ref 1.5–6.5)
NEUT%: 71.3 % (ref 38.4–76.8)
Platelets: 234 10*3/uL (ref 145–400)
WBC: 5 10*3/uL (ref 3.9–10.3)

## 2011-09-12 LAB — COMPREHENSIVE METABOLIC PANEL
AST: 23 U/L (ref 0–37)
Albumin: 4.2 g/dL (ref 3.5–5.2)
Alkaline Phosphatase: 72 U/L (ref 39–117)
BUN: 21 mg/dL (ref 6–23)
Calcium: 9.5 mg/dL (ref 8.4–10.5)
Creatinine, Ser: 0.85 mg/dL (ref 0.50–1.10)
Glucose, Bld: 77 mg/dL (ref 70–99)
Potassium: 3.5 mEq/L (ref 3.5–5.3)

## 2011-09-12 MED ORDER — HEPARIN SOD (PORK) LOCK FLUSH 100 UNIT/ML IV SOLN
500.0000 [IU] | Freq: Once | INTRAVENOUS | Status: AC
  Administered 2011-09-12: 500 [IU] via INTRAVENOUS
  Filled 2011-09-12: qty 5

## 2011-09-12 MED ORDER — SODIUM CHLORIDE 0.9 % IJ SOLN
10.0000 mL | INTRAMUSCULAR | Status: DC | PRN
  Administered 2011-09-12: 10 mL via INTRAVENOUS
  Filled 2011-09-12: qty 10

## 2011-09-12 NOTE — Progress Notes (Signed)
OFFICE PROGRESS NOTE   09/12/2011   Physicians:L.Nyland, S.Norris, P.Carolynne Edouard, T.Fontaine   INTERVAL HISTORY:  Patient is seen, alone for visit, in continuing attention to her bilateral breast carcinoma, most recently spring 2010, continuing aromatase inhibitor. She also has PAC still in place.  Patient most recently had T1bN2 (6 of 23 nodes) grade 1 invasive ductal carcinoma of right breast ER/PR + and HER2 - diagnosed spring 2010. She had adjuvant taxotere/cytoxan, local radiation and continues aromatase inhibtor which was begun in Nov.2010. Past history is significant for T2 left breast cancer with micrometastatic involvement of sentinel node in 1998, treated with adjuvant adria/cytoxan/taxol, radiation and 2 1/2 years of tamoxifen. Last bone density scan was at [facility] 02-24-11, still in normal range but down from 2010.Marland Kitchen Last bilateral mammograms were at [facility] 04-07-11 and last breast MRI June 25, 2010. She still has PAC in place which is flushed q 6-8 weeks; note peripheral venous access is limited as she has had bilateral axillary dissections and radiation.  Patient has been doing well since she was here last, with no new or different symptoms that seem referable to the breast cancer history or that treatment. She has had no recent infectious illness, no respiratory or cardiac symptoms, no bleeding or symptoms of blood clots, still some occasional itching discomfort at site of previous zoster left trunk. Chronic problems with degenerative arthritis right knee. No problems with PAC. No changes on breast self exam. She is not exercising regularly; we have discussed water aerobics and [facility] social worker has given her information on available exercise programs. Remainder of 10 point Review of Systems negative.  Young granddaughter has had heart surgery, doing well. Objective:  Vital signs in last 24 hours:  BP 120/70  Pulse 72  Temp 98 F (36.7 C) (Oral)  Resp 20  Wt 189 lb 12.8 oz  (86.093 kg)  LMP 01/25/2004 Weight is up 3 lbs from Dec. Looks comfortable, very pleasant as always.    HEENT:PERRLA, sclera clear, anicteric and oropharynx clear, no lesions LymphaticsCervical, supraclavicular, and axillary nodes normal.No inguinal adenopathy Resp: clear to auscultation bilaterally and normal percussion bilaterally Cardio: regular rate and rhythm GI: soft, non-tender; bowel sounds normal; no masses,  no organomegaly Extremities: extremities normal, atraumatic, no cyanosis or edema Neuro:no sensory deficits noted Breast:normal without suspicious masses, skin or nipple changes or axillary nodes and self-exam is taught and encouraged Portacath-without erythema or tenderness, flushed today Skin: residual discoloration from zoster left midback to upper left abdomen  Lab Results:  drawn from [facility] and available after visit: WBC 5.0, ANC 3.6, Hgb 12.1, plt 234k  CMET normal with exception of Tbili 1.3, rest of LFTs normal  Will add Vitamin D to next labs here  Studies/Results:  No results found.  Medications: I have reviewed the patient's current medications.  Assessment/Plan: 1.Bilateral breast cancer, node postitve, history as above and continuing Arimidex. I will see her back in 6 months, or sooner if needed. She will be due mammograms in March and MRI within 3 months after mammograms 2.PAC in: flush q 8 wks 3.degenerative arthritis right knee, known to orthopedics 4.Bone density normal but decreasing by scan Jan 2013. Increase exercise and check Vit D next labs here  Patient was in agreement with plan as above  LIVESAY,LENNIS P, MD   09/12/2011, 1:49 PM

## 2011-09-12 NOTE — Telephone Encounter (Signed)
Gave pt appt for February 2014, lab and MD, March 2014 mammogram [facility]

## 2011-09-12 NOTE — Patient Instructions (Addendum)
Start regular exercising   Continue calcium with D daily

## 2011-09-12 NOTE — Telephone Encounter (Signed)
Per Dr Darrold Span, pt informed that hbg was better today at 12.1 and that the schedulers will be in touch regarding PAC flushes being scheduled.  Pt verbalized understanding.  SLJ

## 2011-09-16 ENCOUNTER — Telehealth: Payer: Self-pay | Admitting: Oncology

## 2011-09-16 NOTE — Telephone Encounter (Signed)
Called pt and left message for all the flush appt and MD visit for February 2014 lab, MD and flush

## 2011-10-18 ENCOUNTER — Ambulatory Visit: Payer: Medicare Other | Admitting: Gynecology

## 2011-10-31 ENCOUNTER — Ambulatory Visit (INDEPENDENT_AMBULATORY_CARE_PROVIDER_SITE_OTHER): Payer: Medicare Other | Admitting: Gynecology

## 2011-10-31 ENCOUNTER — Encounter: Payer: Self-pay | Admitting: Gynecology

## 2011-10-31 DIAGNOSIS — L900 Lichen sclerosus et atrophicus: Secondary | ICD-10-CM | POA: Insufficient documentation

## 2011-10-31 DIAGNOSIS — L940 Localized scleroderma [morphea]: Secondary | ICD-10-CM

## 2011-10-31 DIAGNOSIS — N898 Other specified noninflammatory disorders of vagina: Secondary | ICD-10-CM

## 2011-10-31 NOTE — Patient Instructions (Signed)
Follow up next February for annual exam

## 2011-10-31 NOTE — Progress Notes (Signed)
Patient presents in follow up 2 left lateral wall vaginal cysts and her biopsy-proven lichen sclerosus. Uses Temovate cream intermittently with good relief of her itching.  Exam with kim assistant External with symmetrical vitiligo type pattern from clitoral hood down both labia majora and labia minora to the perianal region. No specific lesions or abnormalities. Overall atrophic in appearance consistent with lichen sclerosus. Vagina with 2 small palpable submucosal vaginal cyst along left vaginal sidewall. Cervix atrophic.  Bimanual without masses or tenderness  Assessment and plan: 1. Vaginal cysts. Benign in appearance. Stable in size and location over serial exams. We'll continue to monitor. Asymptomatic to the patient. 2. Lichen sclerosus. Stable in appearance. Uses Temovate intermittently with good results. Will follow up in the winter when she is due for an annual follow up exam.

## 2011-11-07 ENCOUNTER — Ambulatory Visit (HOSPITAL_BASED_OUTPATIENT_CLINIC_OR_DEPARTMENT_OTHER): Payer: Medicare Other

## 2011-11-07 VITALS — BP 144/80 | HR 67 | Temp 97.7°F | Resp 18

## 2011-11-07 DIAGNOSIS — C50919 Malignant neoplasm of unspecified site of unspecified female breast: Secondary | ICD-10-CM

## 2011-11-07 DIAGNOSIS — C50419 Malignant neoplasm of upper-outer quadrant of unspecified female breast: Secondary | ICD-10-CM

## 2011-11-07 DIAGNOSIS — Z452 Encounter for adjustment and management of vascular access device: Secondary | ICD-10-CM

## 2011-11-07 MED ORDER — SODIUM CHLORIDE 0.9 % IJ SOLN
10.0000 mL | INTRAMUSCULAR | Status: DC | PRN
  Administered 2011-11-07: 10 mL via INTRAVENOUS
  Filled 2011-11-07: qty 10

## 2011-11-07 MED ORDER — HEPARIN SOD (PORK) LOCK FLUSH 100 UNIT/ML IV SOLN
500.0000 [IU] | Freq: Once | INTRAVENOUS | Status: AC
  Administered 2011-11-07: 500 [IU] via INTRAVENOUS
  Filled 2011-11-07: qty 5

## 2011-11-21 ENCOUNTER — Other Ambulatory Visit: Payer: Self-pay | Admitting: Oncology

## 2011-11-21 DIAGNOSIS — C50919 Malignant neoplasm of unspecified site of unspecified female breast: Secondary | ICD-10-CM

## 2011-12-12 ENCOUNTER — Telehealth: Payer: Self-pay | Admitting: Oncology

## 2011-12-12 NOTE — Telephone Encounter (Signed)
PT had called to verify appt for 12/9.    done     anne

## 2011-12-19 ENCOUNTER — Ambulatory Visit (INDEPENDENT_AMBULATORY_CARE_PROVIDER_SITE_OTHER): Payer: Medicare Other | Admitting: Sports Medicine

## 2011-12-19 ENCOUNTER — Encounter: Payer: Self-pay | Admitting: Sports Medicine

## 2011-12-19 VITALS — BP 131/82 | HR 79 | Ht 64.5 in | Wt 189.0 lb

## 2011-12-19 DIAGNOSIS — M65839 Other synovitis and tenosynovitis, unspecified forearm: Secondary | ICD-10-CM

## 2011-12-19 DIAGNOSIS — M778 Other enthesopathies, not elsewhere classified: Secondary | ICD-10-CM

## 2011-12-19 NOTE — Patient Instructions (Addendum)
You have been diagnosed with De quervains syndrome. Start taking the Mobic you have at home for 10 days.  If the pain gets worse in the next 2 wks, call back and we will give you an injection before that time

## 2011-12-19 NOTE — Progress Notes (Signed)
  Subjective:    Patient ID: Alison [name], female    DOB: 1940/12/27, 71 y.o.   MRN: [id number]  HPI chief complaint: Left wrist pain  Very pleasant 71 year old female comes in today complaining of 2 months of intermittent left wrist pain. No injury that she recall but gradual onset of pain that is along the radial aspect of her wrist and thumb. It is worse with activity and improve some at rest. She has noticed some intermittent swelling. No numbness or tingling. No similar problems in the past. She denies any increase in activity in regards to her left wrist or hand. She is right-hand dominant.  Past medical history and medications are reviewed. She's currently being treated for bilateral breast cancer.    Review of Systems     Objective:   Physical Exam Well-developed, well-nourished. No acute distress. Awake alert and oriented x3  Left wrist: Limited range of motion secondary to pain. There is tenderness to palpation along the first extensor compartment with a positive Finkelstein's. Mild soft tissue swelling along the first extensor compartment as well. No other bony or soft tissue tenderness to direct palpation. Good radial and ulnar pulses. Negative Tinel's over the carpal tunnel. Decreased grip strength secondary to pain. Brisk capillary refill.  MSK ultrasound of the left wrist: Limited ultrasound of the first extensor compartment was performed. Images were obtained in both transverse and longitudinal planes. There is fluid surrounding both the APL and EPB tendons consistent with tendinitis. Increased Doppler flow in this area as well. Findings are consistent with DeQuervain's tenosynovitis       Assessment & Plan:  1. Left wrist pain secondary to DeQuervains tenosynovitis  Patient has Mobic at home. She'll start taking it daily for the next 10 days with food. Thumb spica brace to be worn with activity for the next 3 weeks. Followup at the end of that time for reevaluation. If  still symptomatic, consider merits of cortisone injection. If symptoms persist in the interim she may feel free to return to the office sooner for the injection.

## 2012-01-02 ENCOUNTER — Telehealth: Payer: Self-pay | Admitting: *Deleted

## 2012-01-02 NOTE — Telephone Encounter (Signed)
Patient called and rescheduled her flush appt for today to Wednesday.  JMW

## 2012-01-04 ENCOUNTER — Ambulatory Visit (HOSPITAL_BASED_OUTPATIENT_CLINIC_OR_DEPARTMENT_OTHER): Payer: Medicare Other

## 2012-01-04 VITALS — BP 150/76 | HR 77 | Temp 97.2°F | Resp 20

## 2012-01-04 DIAGNOSIS — C50419 Malignant neoplasm of upper-outer quadrant of unspecified female breast: Secondary | ICD-10-CM

## 2012-01-04 DIAGNOSIS — Z452 Encounter for adjustment and management of vascular access device: Secondary | ICD-10-CM

## 2012-01-04 DIAGNOSIS — C50919 Malignant neoplasm of unspecified site of unspecified female breast: Secondary | ICD-10-CM

## 2012-01-04 MED ORDER — HEPARIN SOD (PORK) LOCK FLUSH 100 UNIT/ML IV SOLN
500.0000 [IU] | Freq: Once | INTRAVENOUS | Status: AC
  Administered 2012-01-04: 500 [IU] via INTRAVENOUS
  Filled 2012-01-04: qty 5

## 2012-01-04 MED ORDER — SODIUM CHLORIDE 0.9 % IJ SOLN
10.0000 mL | INTRAMUSCULAR | Status: DC | PRN
  Administered 2012-01-04: 10 mL via INTRAVENOUS
  Filled 2012-01-04: qty 10

## 2012-01-09 ENCOUNTER — Ambulatory Visit: Payer: Medicare Other | Admitting: Sports Medicine

## 2012-01-20 ENCOUNTER — Encounter: Payer: Self-pay | Admitting: Sports Medicine

## 2012-01-20 ENCOUNTER — Ambulatory Visit (INDEPENDENT_AMBULATORY_CARE_PROVIDER_SITE_OTHER): Payer: Medicare Other | Admitting: Sports Medicine

## 2012-01-20 VITALS — BP 138/79 | HR 82 | Wt 191.0 lb

## 2012-01-20 DIAGNOSIS — M654 Radial styloid tenosynovitis [de Quervain]: Secondary | ICD-10-CM

## 2012-01-20 NOTE — Progress Notes (Signed)
Subjective:    CC: Wrist pain  HPI: Alison [name] is a very pleasant 71 year old female comes in with a greater than one-month history of pain that she localizes over her first extensor compartment of her right wrist. She denies any trauma, or change in activity. The pain is localized, radiates up the forearm, is severe, and sharp. It's worse with all her deviation of the forearm as well as abduction of the thumb. She was recently seen at the [facility], was treated with an anti-inflammatory which was ineffective, as well as a thumb spica brace which was moderately affected. She comes here for further assessment and treatment.  Past medical history, Surgical history, Family history, Social history, Allergies, and medications have been entered into the medical record, reviewed, and no changes needed.   Review of Systems: No headache, visual changes, nausea, vomiting, diarrhea, constipation, dizziness, abdominal pain, skin rash, fevers, chills, night sweats, swollen lymph nodes, weight loss, chest pain, body aches, joint swelling, muscle aches, shortness of breath, mood changes, visual or auditory hallucinations.  Objective:    General: Well Developed, well nourished, and in no acute distress.  Neuro: Alert and oriented x3, extra-ocular muscles intact.  HEENT: Normocephalic, atraumatic, pupils equal round reactive to light, neck supple, no masses, no lymphadenopathy, thyroid nonpalpable.  Skin: Warm and dry, no rashes noted.  Cardiac: Regular rate and rhythm, no murmurs rubs or gallops.  Respiratory: Clear to auscultation bilaterally. Not using accessory muscles, speaking in full sentences.  Abdominal: Soft, nontender, nondistended, positive bowel sounds, no masses, no organomegaly.  Right Wrist: Inspection normal with no visible erythema or swelling. ROM smooth and normal with good flexion and extension and ulnar/radial deviation that is symmetrical with opposite wrist. Palpation  is normal over metacarpals, navicular, lunate, and TFCC; tendons without tenderness/ swelling No snuffbox tenderness. No tenderness over Canal of Guyon. Strength 5/5 in all directions without pain. Positive Finkelstein sign, negative Tinel's and Phalen's signs. Negative Watson's test.  Procedure: Real-time Ultrasound Guided Injection of right first extensor compartment Device: GE Logiq E  Ultrasound guided injection is preferred based studies that show increased duration, increased effect, greater accuracy, decreased procedural pain, increased response rate, and decreased cost with ultrasound guided versus blind injection.  Verbal informed consent obtained.  Time-out conducted.  Noted no overlying erythema, induration, or other signs of local infection.  Skin prepped in a sterile fashion.  Local anesthesia: Topical Ethyl chloride.  With sterile technique and under real time ultrasound guidance:  Noted significant fluid in the first extensor compartment, consistent with de Quervain's tenosynovitis. Needle advanced into the tendon sheath between abductor pollicis longus and extensor pollicis brevis tendons. 1 cc Kenalog 40, 3 cc lidocaine injected easily, and tendon sheath seen distending up forearm. Completed without difficulty  Pain immediately resolved suggesting accurate placement of the medication.  Advised to call if fevers/chills, erythema, induration, drainage, or persistent bleeding.  Images permanently stored and available for review in the ultrasound unit.  Impression: Technically successful ultrasound guided injection.  Impression and Recommendations:    The patient was counselled, risk factors were discussed, anticipatory guidance given.

## 2012-01-20 NOTE — Assessment & Plan Note (Signed)
Ultrasound guided injection as above. Go back into wrist brace for 2 weeks. Home rehabilitation. Return to see me in 4 weeks to reassess.

## 2012-02-07 ENCOUNTER — Encounter: Payer: Self-pay | Admitting: Internal Medicine

## 2012-02-16 ENCOUNTER — Encounter: Payer: Medicare Other | Admitting: Gynecology

## 2012-02-27 ENCOUNTER — Ambulatory Visit (HOSPITAL_BASED_OUTPATIENT_CLINIC_OR_DEPARTMENT_OTHER): Payer: Medicare Other | Admitting: Oncology

## 2012-02-27 ENCOUNTER — Ambulatory Visit: Payer: Medicare Other

## 2012-02-27 ENCOUNTER — Other Ambulatory Visit: Payer: Self-pay | Admitting: *Deleted

## 2012-02-27 ENCOUNTER — Encounter: Payer: Self-pay | Admitting: Oncology

## 2012-02-27 ENCOUNTER — Other Ambulatory Visit (HOSPITAL_BASED_OUTPATIENT_CLINIC_OR_DEPARTMENT_OTHER): Payer: Medicare Other | Admitting: Lab

## 2012-02-27 VITALS — BP 154/81 | HR 77 | Temp 97.7°F | Resp 18 | Ht 64.5 in | Wt 192.4 lb

## 2012-02-27 DIAGNOSIS — Z170 Estrogen receptor positive status [ER+]: Secondary | ICD-10-CM

## 2012-02-27 DIAGNOSIS — C50919 Malignant neoplasm of unspecified site of unspecified female breast: Secondary | ICD-10-CM

## 2012-02-27 DIAGNOSIS — F1921 Other psychoactive substance dependence, in remission: Secondary | ICD-10-CM

## 2012-02-27 DIAGNOSIS — C50419 Malignant neoplasm of upper-outer quadrant of unspecified female breast: Secondary | ICD-10-CM

## 2012-02-27 LAB — CBC WITH DIFFERENTIAL/PLATELET
BASO%: 0.8 % (ref 0.0–2.0)
Basophils Absolute: 0 10*3/uL (ref 0.0–0.1)
EOS%: 0.8 % (ref 0.0–7.0)
HCT: 36.6 % (ref 34.8–46.6)
HGB: 12.4 g/dL (ref 11.6–15.9)
MCH: 32.7 pg (ref 25.1–34.0)
MCHC: 33.8 g/dL (ref 31.5–36.0)
MCV: 96.7 fL (ref 79.5–101.0)
MONO%: 8 % (ref 0.0–14.0)
NEUT%: 74.9 % (ref 38.4–76.8)
lymph#: 0.8 10*3/uL — ABNORMAL LOW (ref 0.9–3.3)

## 2012-02-27 LAB — COMPREHENSIVE METABOLIC PANEL (CC13)
AST: 19 U/L (ref 5–34)
Alkaline Phosphatase: 92 U/L (ref 40–150)
BUN: 15.5 mg/dL (ref 7.0–26.0)
Creatinine: 0.8 mg/dL (ref 0.6–1.1)
Glucose: 82 mg/dl (ref 70–99)
Total Bilirubin: 1.12 mg/dL (ref 0.20–1.20)

## 2012-02-27 MED ORDER — HEPARIN SOD (PORK) LOCK FLUSH 100 UNIT/ML IV SOLN
500.0000 [IU] | Freq: Once | INTRAVENOUS | Status: AC
  Administered 2012-02-27: 500 [IU] via INTRAVENOUS
  Filled 2012-02-27: qty 5

## 2012-02-27 MED ORDER — SODIUM CHLORIDE 0.9 % IJ SOLN
10.0000 mL | INTRAMUSCULAR | Status: DC | PRN
  Administered 2012-02-27: 10 mL via INTRAVENOUS
  Filled 2012-02-27: qty 10

## 2012-02-27 NOTE — Telephone Encounter (Signed)
Gave pt appt for lab, flush and MD visit for March, May and August 2014

## 2012-02-27 NOTE — Progress Notes (Signed)
OFFICE PROGRESS NOTE   02/27/2012   Physicians:L.Nyland, S.Norris, P.Carolynne Edouard, T.Fontaine   INTERVAL HISTORY:   Patient is seen, alone for visit, in scheduled follow up of her history of bilateral breast carcinoma, continuing aromatase inhibitor(arimidex). She still has PAC in, which is kept flushed q 8 weeks.  Patient most recently had T1bN2 (6 of 23 nodes) grade 1 invasive ductal carcinoma of right breast ER/PR + and HER2 - diagnosed spring 2010. She had adjuvant taxotere/cytoxan, local radiation and continues aromatase inhibtor which was begun in Nov.2010. Past history is significant for T2 left breast cancer with micrometastatic involvement of sentinel node in 1998, treated with adjuvant adria/cytoxan/taxol, radiation and 2 1/2 years of tamoxifen. Last bone density scan was at [facility] 02-24-11, still in normal range but down from 2010.Marland Kitchen Last bilateral mammograms were at [facility] 04-07-11 and last breast MRI June 25, 2010. She still has PAC in place which is flushed q 6-8 weeks; note peripheral venous access is limited as she has had bilateral axillary dissections and radiation.  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 She is to see PCP Dr Lysbeth Galas next week.She has had no infectious illness, no new or different pain, appetite and energy good, no SOB, some pain left hand improved with mobic for 10 days and a brace for a month after I saw her last. Remainder of 10 point Review of Systems negative.  She is to see Dr Audie Box Feb 10 and will have regularly scheduled colonoscopy (~ 10 years) by Dr Juanda Chance in early March.  Objective:  Vital signs in last 24 hours:  BP 154/81  Pulse 77  Temp 97.7 F (36.5 C) (Oral)  Resp 18  Ht 5' 4.5" (1.638 m)  Wt 192 lb 6.4 oz (87.272 kg)  BMI 32.52 kg/m2  LMP 01/25/2004 Weight is up 3 lbs. Alert, easily ambulatory, looks  comfortable.   HEENT:PERRLA, extra ocular movement intact, sclera clear, anicteric and oropharynx clear, no lesions LymphaticsCervical, supraclavicular, and axillary nodes normal. Resp: clear to auscultation bilaterally and normal percussion bilaterally Cardio: regular rate and rhythm GI: soft, non-tender; bowel sounds normal; no masses,  no organomegaly Extremities: extremities normal, atraumatic, no cyanosis or edema Neuro:no sensory deficits noted Breasts: bilateral lumpectomy scars well healed, no dominant mass, no skin or nipple findings of concern, axillae and UE not remarkable. Portacath-without erythema or tenderness, flushed today  Lab Results:  Results for orders placed in visit on 02/27/12  CBC WITH DIFFERENTIAL      Component Value Range   WBC 5.3  3.9 - 10.3 10e3/uL   NEUT# 3.9  1.5 - 6.5 10e3/uL   HGB 12.4  11.6 - 15.9 g/dL   HCT 16.1  09.6 - 04.5 %   Platelets 233  145 - 400 10e3/uL   MCV 96.7  79.5 - 101.0 fL   MCH 32.7  25.1 - 34.0 pg   MCHC 33.8  31.5 - 36.0 g/dL   RBC 4.09  8.11 - 9.14 10e6/uL   RDW 14.1  11.2 - 14.5 %   lymph# 0.8 (*) 0.9 - 3.3 10e3/uL   MONO# 0.4  0.1 - 0.9 10e3/uL   Eosinophils Absolute 0.0  0.0 - 0.5 10e3/uL   Basophils Absolute 0.0  0.0 - 0.1 10e3/uL   NEUT% 74.9  38.4 - 76.8 %   LYMPH% 15.5  14.0 - 49.7 %  MONO% 8.0  0.0 - 14.0 %   EOS% 0.8  0.0 - 7.0 %   BASO% 0.8  0.0 - 2.0 %  COMPREHENSIVE METABOLIC PANEL (CC13)      Component Value Range   Sodium 140  136 - 145 mEq/L   Potassium 3.4 (*) 3.5 - 5.1 mEq/L   Chloride 103  98 - 107 mEq/L   CO2 26  22 - 29 mEq/L   Glucose 82  70 - 99 mg/dl   BUN 16.1  7.0 - 09.6 mg/dL   Creatinine 0.8  0.6 - 1.1 mg/dL   Total Bilirubin 0.45  0.20 - 1.20 mg/dL   Alkaline Phosphatase 92  40 - 150 U/L   AST 19  5 - 34 U/L   ALT 20  0 - 55 U/L   Total Protein 7.4  6.4 - 8.3 g/dL   Albumin 3.5  3.5 - 5.0 g/dL   Calcium 9.3  8.4 - 40.9 mg/dL     Studies/Results:  No results found. Mammograms  will be done at [facility] in March  Medications: I have reviewed the patient's current medications. She will continue arimidex.  Assessment/Plan:  1.Bilateral breast cancer, node postitve, history as above and continuing Arimidex. I will see her back in 6 months, or sooner if needed. She will be due mammograms in March and can consider MRI within 3 months after mammograms.  2.PAC in: flush q 8 wks  3.degenerative arthritis   4.Bone density normal in Feb 2013 tho down somewhat from 2010. With multiple other appointments upcoming and with normal range last year will not order this now. 5.flu vaccine done this fall 6.anxiety and intermittently more elevated BP: to see Dr Lysbeth Galas next week. BP not excessively elevated here today  Patient was in agreement with plan above. LIVESAY,LENNIS P, MD   02/27/2012, 5:00 PM

## 2012-02-27 NOTE — Patient Instructions (Signed)
portacath flush every 6-8 weeks  Mammograms 03-28-12  Colonoscopy and Dr Audie Box as scheduled

## 2012-02-29 ENCOUNTER — Telehealth: Payer: Self-pay | Admitting: *Deleted

## 2012-02-29 NOTE — Telephone Encounter (Signed)
Pt notified of lab results per Dr Precious Reel note below. Pt states she will increase K in diet with bananas and OJ.Marland Kitchen

## 2012-02-29 NOTE — Telephone Encounter (Signed)
Message copied by Phillis Knack on Wed Feb 29, 2012  3:36 PM ------      Message from: Lorine Bears      Created: Tue Feb 28, 2012  8:36 PM                   ----- Message -----         From: Reece Packer, MD         Sent: 02/27/2012   8:29 PM           To: Lorine Bears, RN            Labs seen and need follow up  Please let her know K+ just a little low, needs to increase in diet.

## 2012-03-05 ENCOUNTER — Ambulatory Visit (INDEPENDENT_AMBULATORY_CARE_PROVIDER_SITE_OTHER): Payer: Medicare Other | Admitting: Gynecology

## 2012-03-05 ENCOUNTER — Encounter: Payer: Self-pay | Admitting: Gynecology

## 2012-03-05 VITALS — BP 124/80 | Ht 63.0 in | Wt 177.0 lb

## 2012-03-05 DIAGNOSIS — L900 Lichen sclerosus et atrophicus: Secondary | ICD-10-CM

## 2012-03-05 DIAGNOSIS — L940 Localized scleroderma [morphea]: Secondary | ICD-10-CM

## 2012-03-05 DIAGNOSIS — M1990 Unspecified osteoarthritis, unspecified site: Secondary | ICD-10-CM | POA: Insufficient documentation

## 2012-03-05 DIAGNOSIS — N898 Other specified noninflammatory disorders of vagina: Secondary | ICD-10-CM

## 2012-03-05 DIAGNOSIS — N952 Postmenopausal atrophic vaginitis: Secondary | ICD-10-CM

## 2012-03-05 MED ORDER — CLOBETASOL PROPIONATE 0.05 % EX CREA: TOPICAL_CREAM | CUTANEOUS | Status: DC

## 2012-03-05 NOTE — Progress Notes (Signed)
Alison [name] 02/07/1940 [id number]        72 y.o.  G3P3003 for follow up exam.  Several issues noted below.  Past medical history,surgical history, medications, allergies, family history and social history were all reviewed and documented in the EPIC chart. ROS:  Was performed and pertinent positives and negatives are included in the history.  Exam: Kim assistant Filed Vitals:   03/05/12 0955  BP: 124/80  Height: 5\' 3"  (1.6 m)  Weight: 177 lb (80.287 kg)   General appearance  Normal Skin grossly normal Head/Neck normal with no cervical or supraclavicular adenopathy thyroid normal Lungs  clear Cardiac RR, without RMG Abdominal  soft, nontender, without masses, organomegaly or hernia Breasts  examined lying and sitting without masses, retractions, discharge or axillary adenopathy.  Bilateral well-healed scars consistent with her past history of breast cancer x2. Pelvic  Ext/BUS/vagina  Atrophic.  Symmetrical vitiligo type pattern from periclitoral to perianal consistent with her history of lichen sclerosus. No specific lesions. 2 small submucosal cyst lateral vaginal wall unchanged from prior exam.  Port-A-Cath noted left upper anterior chest.  Cervix  normal atrophic  Uterus  grossly normal size, midline and mobile nontender   Adnexa  Without masses or tenderness    Anus and perineum  normal   Rectovaginal  normal sphincter tone without palpated masses or tenderness.    Assessment/Plan:  72 y.o. G70P3003 female for follow up exam.   1. Lichen sclerosis. Patient has occasional flares for which she uses cholesterol 0.05% cream. I refilled her cream x2 tubes. She'll apply when necessary on a nightly basis. Exam otherwise shows no specific lesions or abnormalities other than her symmetrical white changes. 2. Small submucosal vaginal cysts. Stable on exam. We'll continue to monitor his are asymptomatic to the patient. 3. Pap smear 2011. No Pap smear done today. Patient does give history  of abnormal Pap smears with cryo-surgery a number of years ago, greater than 20. Normal Pap smears since. We reviewed current screening guidelines we'll plan to stop screening. 4. Breast cancer x2. Patient actively being followed by Dr. Darrold Span in oncology. Is due for her mammogram and is following up for this. We'll continue to see her and follow up per their recommended screening. SBE monthly reviewed. 5. Colonoscopy scheduled next month. 6. DEXA 2013 normal. Obtains through her oncologist's office will follow up with them for repeat screening per their recommendation. 7. Health maintenance. Patient has appointment to see her primary and will follow up with them. No lab work done as it will be done at their office.  Follow up in one year, sooner as needed.    Dara Lords MD, 10:23 AM 03/05/2012

## 2012-03-05 NOTE — Patient Instructions (Signed)
Follow up in one year for annual gynecologic exam. Continue to follow up with your oncologist and primary care physician.

## 2012-03-14 ENCOUNTER — Ambulatory Visit: Payer: Medicare Other | Admitting: Oncology

## 2012-03-14 ENCOUNTER — Other Ambulatory Visit: Payer: Medicare Other | Admitting: Lab

## 2012-03-16 ENCOUNTER — Ambulatory Visit (AMBULATORY_SURGERY_CENTER): Payer: Medicare Other

## 2012-03-16 VITALS — Ht 63.0 in | Wt 194.0 lb

## 2012-03-16 DIAGNOSIS — Z800 Family history of malignant neoplasm of digestive organs: Secondary | ICD-10-CM

## 2012-03-16 DIAGNOSIS — Z1211 Encounter for screening for malignant neoplasm of colon: Secondary | ICD-10-CM

## 2012-03-16 MED ORDER — MOVIPREP 100 G PO SOLR: 1.0000 | Freq: Once | ORAL | Status: DC

## 2012-03-27 ENCOUNTER — Telehealth: Payer: Self-pay | Admitting: *Deleted

## 2012-03-27 NOTE — Telephone Encounter (Signed)
Message copied by Carola Rhine A on Tue Mar 27, 2012  1:41 PM ------      Message from: Reece Packer      Created: Tue Mar 27, 2012 11:48 AM       Labs seen and need follow up: if we did not already let her know about the Vitamin D level, tell her that it was in low normal range. Continue calcium with D. ------

## 2012-03-27 NOTE — Telephone Encounter (Signed)
Pt notified of labs below. Pt states she has calcium with D at home and will take it.

## 2012-03-28 ENCOUNTER — Telehealth: Payer: Self-pay | Admitting: Internal Medicine

## 2012-03-28 NOTE — Telephone Encounter (Signed)
Yes, please charge for late cancellation.The weather forecast is actually favorable.

## 2012-03-28 NOTE — Telephone Encounter (Signed)
Spoke with patient. She wants to reschedule at this time due to upcoming weather. Transfer her via phone to schedulers.

## 2012-03-28 NOTE — Telephone Encounter (Signed)
Pt called back and said she can be reached at [phone]

## 2012-03-30 ENCOUNTER — Encounter: Payer: Medicare Other | Admitting: Internal Medicine

## 2012-04-06 ENCOUNTER — Ambulatory Visit
Admission: RE | Admit: 2012-04-06 | Discharge: 2012-04-06 | Disposition: A | Discharge status: Home / Self Care | Payer: Medicare Other | Source: Ambulatory Visit | Attending: Oncology | Admitting: Oncology

## 2012-04-09 NOTE — Telephone Encounter (Signed)
Per Alison [name] no charge due to weather conditions on Friday March 7th.

## 2012-04-17 ENCOUNTER — Encounter: Payer: Self-pay | Admitting: Internal Medicine

## 2012-04-17 ENCOUNTER — Ambulatory Visit (AMBULATORY_SURGERY_CENTER): Payer: Medicare Other | Admitting: Internal Medicine

## 2012-04-17 VITALS — BP 157/74 | HR 62 | Temp 97.2°F | Resp 15 | Ht 64.0 in | Wt 191.0 lb

## 2012-04-17 DIAGNOSIS — Z1211 Encounter for screening for malignant neoplasm of colon: Secondary | ICD-10-CM

## 2012-04-17 DIAGNOSIS — Z800 Family history of malignant neoplasm of digestive organs: Secondary | ICD-10-CM

## 2012-04-17 MED ORDER — SODIUM CHLORIDE 0.9 % IV SOLN: 500.0000 mL | INTRAVENOUS | Status: DC

## 2012-04-17 NOTE — Progress Notes (Signed)
Patient did not experience any of the following events: a burn prior to discharge; a fall within the facility; wrong site/side/patient/procedure/implant event; or a hospital transfer or hospital admission upon discharge from the facility. (G8907) Patient did not have preoperative order for IV antibiotic SSI prophylaxis. (G8918)  

## 2012-04-17 NOTE — Patient Instructions (Addendum)

## 2012-04-17 NOTE — Op Note (Signed)
[facility] [address] [place], [address]   COLONOSCOPY PROCEDURE REPORT  PATIENT: Alison [name], Alison [name]  MR#: [id number] BIRTHDATE: [birth year] , 72  yrs. old GENDER: Female ENDOSCOPIST: Hart Carwin, MD REFERRED BY:  Joette Catching, M.D. PROCEDURE DATE:  04/17/2012 PROCEDURE:   Colonoscopy, screening ASA CLASS:   Class II INDICATIONS:Patient's immediate family history of colon cancer and mother with colon cancer, last colon 2007, hx of breast cancer. MEDICATIONS: MAC sedation, administered by CRNA and propofol (Diprivan) 250mg  IV  DESCRIPTION OF PROCEDURE:   After the risks and benefits and of the procedure were explained, informed consent was obtained.  A digital rectal exam revealed no abnormalities of the rectum.    The LB [id number]  endoscope was introduced through the anus and advanced to the cecum, which was identified by both the appendix and ileocecal valve .  The quality of the prep was good, using MoviPrep .  The instrument was then slowly withdrawn as the colon was fully examined.     COLON FINDINGS: A normal appearing cecum, ileocecal valve, and appendiceal orifice were identified.  The ascending, hepatic flexure, transverse, splenic flexure, descending, sigmoid colon and rectum appeared unremarkable.  No polyps or cancers were seen. Retroflexed views revealed no abnormalities.     The scope was then withdrawn from the patient and the procedure completed.  COMPLICATIONS: There were no complications. ENDOSCOPIC IMPRESSION: Normal colon  RECOMMENDATIONS: High fiber diet   REPEAT EXAM: In 5 year(s)  for Colonoscopy.  cc:  _______________________________ eSignedHart Carwin, MD 04/17/2012 2:02 PM

## 2012-04-18 ENCOUNTER — Telehealth: Payer: Self-pay

## 2012-04-18 NOTE — Telephone Encounter (Signed)
  Follow up Call-  Call back number 04/17/2012  Post procedure Call Back phone  # cell [phone]     Patient questions:  Do you have a fever, pain , or abdominal swelling? no Pain Score  0 *  Have you tolerated food without any problems? yes  Have you been able to return to your normal activities? yes  Do you have any questions about your discharge instructions: Diet   no Medications  no Follow up visit  no  Do you have questions or concerns about your Care? no  Actions: * If pain score is 4 or above: No action needed, pain <4.

## 2012-04-20 ENCOUNTER — Telehealth: Payer: Self-pay | Admitting: *Deleted

## 2012-04-20 NOTE — Telephone Encounter (Signed)
Patient called and requested that her flush appt be moved from Monday to Friday. Appt moved and patient notified.  JMW

## 2012-04-27 ENCOUNTER — Ambulatory Visit (HOSPITAL_BASED_OUTPATIENT_CLINIC_OR_DEPARTMENT_OTHER): Payer: Medicare Other

## 2012-04-27 VITALS — BP 146/79 | HR 79 | Temp 97.4°F | Resp 20

## 2012-04-27 DIAGNOSIS — C50919 Malignant neoplasm of unspecified site of unspecified female breast: Secondary | ICD-10-CM

## 2012-04-27 DIAGNOSIS — Z452 Encounter for adjustment and management of vascular access device: Secondary | ICD-10-CM

## 2012-04-27 DIAGNOSIS — C50419 Malignant neoplasm of upper-outer quadrant of unspecified female breast: Secondary | ICD-10-CM

## 2012-04-27 MED ORDER — HEPARIN SOD (PORK) LOCK FLUSH 100 UNIT/ML IV SOLN
500.0000 [IU] | Freq: Once | INTRAVENOUS | Status: AC
  Administered 2012-04-27: 500 [IU] via INTRAVENOUS
  Filled 2012-04-27: qty 5

## 2012-04-27 MED ORDER — SODIUM CHLORIDE 0.9 % IJ SOLN
10.0000 mL | INTRAMUSCULAR | Status: DC | PRN
  Administered 2012-04-27: 10 mL via INTRAVENOUS
  Filled 2012-04-27: qty 10

## 2012-04-27 NOTE — Patient Instructions (Addendum)
You had your port flushed today. Continue to have flushed every 6-8 weeks.

## 2012-05-11 ENCOUNTER — Telehealth: Payer: Self-pay | Admitting: Oncology

## 2012-05-11 NOTE — Telephone Encounter (Signed)
Moved flush appt from 5/26 to 5/28. S/w pt she is aware. 5/26 holiday.

## 2012-05-23 ENCOUNTER — Other Ambulatory Visit: Payer: Self-pay | Admitting: Oncology

## 2012-05-23 DIAGNOSIS — C50919 Malignant neoplasm of unspecified site of unspecified female breast: Secondary | ICD-10-CM

## 2012-06-20 ENCOUNTER — Ambulatory Visit (HOSPITAL_BASED_OUTPATIENT_CLINIC_OR_DEPARTMENT_OTHER): Payer: Medicare Other

## 2012-06-20 VITALS — BP 134/74 | HR 74 | Temp 97.1°F

## 2012-06-20 DIAGNOSIS — C801 Malignant (primary) neoplasm, unspecified: Secondary | ICD-10-CM

## 2012-06-20 DIAGNOSIS — C50419 Malignant neoplasm of upper-outer quadrant of unspecified female breast: Secondary | ICD-10-CM

## 2012-06-20 DIAGNOSIS — Z452 Encounter for adjustment and management of vascular access device: Secondary | ICD-10-CM

## 2012-06-20 MED ORDER — HEPARIN SOD (PORK) LOCK FLUSH 100 UNIT/ML IV SOLN
500.0000 [IU] | Freq: Once | INTRAVENOUS | Status: AC
  Administered 2012-06-20: 500 [IU] via INTRAVENOUS
  Filled 2012-06-20: qty 5

## 2012-06-20 MED ORDER — SODIUM CHLORIDE 0.9 % IJ SOLN
10.0000 mL | INTRAMUSCULAR | Status: DC | PRN
  Administered 2012-06-20: 10 mL via INTRAVENOUS
  Filled 2012-06-20: qty 10

## 2012-08-27 ENCOUNTER — Ambulatory Visit (HOSPITAL_BASED_OUTPATIENT_CLINIC_OR_DEPARTMENT_OTHER): Payer: Medicare Other

## 2012-08-27 ENCOUNTER — Other Ambulatory Visit (HOSPITAL_BASED_OUTPATIENT_CLINIC_OR_DEPARTMENT_OTHER): Payer: Medicare Other | Admitting: Lab

## 2012-08-27 ENCOUNTER — Telehealth: Payer: Self-pay | Admitting: Hematology and Oncology

## 2012-08-27 ENCOUNTER — Ambulatory Visit (HOSPITAL_BASED_OUTPATIENT_CLINIC_OR_DEPARTMENT_OTHER): Payer: Medicare Other | Admitting: Hematology and Oncology

## 2012-08-27 VITALS — BP 128/71 | HR 76 | Temp 97.3°F | Resp 20 | Ht 64.0 in | Wt 195.5 lb

## 2012-08-27 DIAGNOSIS — R030 Elevated blood-pressure reading, without diagnosis of hypertension: Secondary | ICD-10-CM

## 2012-08-27 DIAGNOSIS — C50919 Malignant neoplasm of unspecified site of unspecified female breast: Secondary | ICD-10-CM

## 2012-08-27 DIAGNOSIS — C50419 Malignant neoplasm of upper-outer quadrant of unspecified female breast: Secondary | ICD-10-CM

## 2012-08-27 DIAGNOSIS — Z170 Estrogen receptor positive status [ER+]: Secondary | ICD-10-CM

## 2012-08-27 DIAGNOSIS — F411 Generalized anxiety disorder: Secondary | ICD-10-CM

## 2012-08-27 LAB — CBC WITH DIFFERENTIAL/PLATELET
Basophils Absolute: 0 10*3/uL (ref 0.0–0.1)
EOS%: 1.5 % (ref 0.0–7.0)
HCT: 35.4 % (ref 34.8–46.6)
HGB: 12.1 g/dL (ref 11.6–15.9)
MCH: 32.6 pg (ref 25.1–34.0)
MONO#: 0.4 10*3/uL (ref 0.1–0.9)
NEUT#: 3.8 10*3/uL (ref 1.5–6.5)
NEUT%: 75.1 % (ref 38.4–76.8)
RDW: 13.4 % (ref 11.2–14.5)
WBC: 5 10*3/uL (ref 3.9–10.3)
lymph#: 0.7 10*3/uL — ABNORMAL LOW (ref 0.9–3.3)

## 2012-08-27 LAB — COMPREHENSIVE METABOLIC PANEL (CC13)
ALT: 16 U/L (ref 0–55)
AST: 19 U/L (ref 5–34)
Albumin: 3.5 g/dL (ref 3.5–5.0)
BUN: 14.5 mg/dL (ref 7.0–26.0)
CO2: 27 mEq/L (ref 22–29)
Calcium: 9.4 mg/dL (ref 8.4–10.4)
Chloride: 105 mEq/L (ref 98–109)
Creatinine: 0.8 mg/dL (ref 0.6–1.1)
Potassium: 3.5 mEq/L (ref 3.5–5.1)

## 2012-08-27 MED ORDER — SODIUM CHLORIDE 0.9 % IJ SOLN
10.0000 mL | INTRAMUSCULAR | Status: DC | PRN
  Administered 2012-08-27: 10 mL via INTRAVENOUS
  Filled 2012-08-27: qty 10

## 2012-08-27 MED ORDER — HEPARIN SOD (PORK) LOCK FLUSH 100 UNIT/ML IV SOLN
500.0000 [IU] | Freq: Once | INTRAVENOUS | Status: AC
  Administered 2012-08-27: 500 [IU] via INTRAVENOUS
  Filled 2012-08-27: qty 5

## 2012-08-27 NOTE — Telephone Encounter (Signed)
gv pt appt schedule for September and November. mammo order entered today to be done in October. Per pt mammo should not be done again for a year. Checked w/Cara @ [facility] and per Charlynne Pander pt due for mammo March of 2015. Pt scheduled for March 2015. Ok per Dr. Karel Jarvis.

## 2012-08-27 NOTE — Patient Instructions (Addendum)
Implanted Port Instructions  An implanted port is a central line that has a round shape and is placed under the skin. It is used for long-term IV (intravenous) access for:  · Medicine.  · Fluids.  · Liquid nutrition, such as TPN (total parenteral nutrition).  · Blood samples.  Ports can be placed:  · In the chest area just below the collarbone (this is the most common place.)  · In the arms.  · In the belly (abdomen) area.  · In the legs.  PARTS OF THE PORT  A port has 2 main parts:  · The reservoir. The reservoir is round, disc-shaped, and will be a small, raised area under your skin.  · The reservoir is the part where a needle is inserted (accessed) to either give medicines or to draw blood.  · The catheter. The catheter is a long, slender tube that extends from the reservoir. The catheter is placed into a large vein.  · Medicine that is inserted into the reservoir goes into the catheter and then into the vein.  INSERTION OF THE PORT  · The port is surgically placed in either an operating room or in a procedural area (interventional radiology).  · Medicine may be given to help you relax during the procedure.  · The skin where the port will be inserted is numbed (local anesthetic).  · 1 or 2 small cuts (incisions) will be made in the skin to insert the port.  · The port can be used after it has been inserted.  INCISION SITE CARE  · The incision site may have small adhesive strips on it. This helps keep the incision site closed. Sometimes, no adhesive strips are placed. Instead of adhesive strips, a special kind of surgical glue is used to keep the incision closed.  · If adhesive strips were placed on the incision sites, do not take them off. They will fall off on their own.  · The incision site may be sore for 1 to 2 days. Pain medicine can help.  · Do not get the incision site wet. Bathe or shower as directed by your caregiver.  · The incision site should heal in 5 to 7 days. A small scar may form after the  incision has healed.  ACCESSING THE PORT  Special steps must be taken to access the port:  · Before the port is accessed, a numbing cream can be placed on the skin. This helps numb the skin over the port site.  · A sterile technique is used to access the port.  · The port is accessed with a needle. Only "non-coring" port needles should be used to access the port. Once the port is accessed, a blood return should be checked. This helps ensure the port is in the vein and is not clogged (clotted).  · If your caregiver believes your port should remain accessed, a clear (transparent) bandage will be placed over the needle site. The bandage and needle will need to be changed every week or as directed by your caregiver.  · Keep the bandage covering the needle clean and dry. Do not get it wet. Follow your caregiver's instructions on how to take a shower or bath when the port is accessed.  · If your port does not need to stay accessed, no bandage is needed over the port.  FLUSHING THE PORT  Flushing the port keeps it from getting clogged. How often the port is flushed depends on:  · If a   constant infusion is running. If a constant infusion is running, the port may not need to be flushed.  · If intermittent medicines are given.  · If the port is not being used.  For intermittent medicines:  · The port will need to be flushed:  · After medicines have been given.  · After blood has been drawn.  · As part of routine maintenance.  · A port is normally flushed with:  · Normal saline.  · Heparin.  · Follow your caregiver's advice on how often, how much, and the type of flush to use on your port.  IMPORTANT PORT INFORMATION  · Tell your caregiver if you are allergic to heparin.  · After your port is placed, you will get a manufacturer's information card. The card has information about your port. Keep this card with you at all times.  · There are many types of ports available. Know what kind of port you have.  · In case of an  emergency, it may be helpful to wear a medical alert bracelet. This can help alert health care workers that you have a port.  · The port can stay in for as long as your caregiver believes it is necessary.  · When it is time for the port to come out, surgery will be done to remove it. The surgery will be similar to how the port was put in.  · If you are in the hospital or clinic:  · Your port will be taken care of and flushed by a nurse.  · If you are at home:  · A home health care nurse may give medicines and take care of the port.  · You or a family member can get special training and directions for giving medicine and taking care of the port at home.  SEEK IMMEDIATE MEDICAL CARE IF:   · Your port does not flush or you are unable to get a blood return.  · New drainage or pus is coming from the incision.  · A bad smell is coming from the incision site.  · You develop swelling or increased redness at the incision site.  · You develop increased swelling or pain at the port site.  · You develop swelling or pain in the surrounding skin near the port.  · You have an oral temperature above 102° F (38.9° C), not controlled by medicine.  MAKE SURE YOU:   · Understand these instructions.  · Will watch your condition.  · Will get help right away if you are not doing well or get worse.  Document Released: 01/10/2005 Document Revised: 04/04/2011 Document Reviewed: 04/03/2008  Exit[employer]® Patient Information ©2014 [employer].

## 2012-08-27 NOTE — Progress Notes (Signed)
OFFICE PROGRESS NOTE   08/27/2012   Physicians:L.Nyland, S.Norris, P.Carolynne Edouard, T.Fontaine   INTERVAL HISTORY:   Patient is seen, alone for visit, in scheduled follow up of her history of bilateral breast carcinoma, continuing aromatase inhibitor(arimidex). She still has PAC in, which is kept flushed q 8 weeks.  Patient most recently had T1bN2 (6 of 23 nodes) grade 1 invasive ductal carcinoma of right breast ER/PR + and HER2 - diagnosed spring 2010. She had adjuvant taxotere/cytoxan, local radiation and continues aromatase inhibtor which was begun in Nov.2010. Past history is significant for T2 left breast cancer with micrometastatic involvement of sentinel node in 1998, treated with adjuvant adria/cytoxan/taxol, radiation and 2 1/2 years of tamoxifen. Last bone density scan was at [facility] 02-24-11, still in normal range but down from 2010.Marland Kitchen Last bilateral mammograms were at [facility] 04-07-11 and last breast MRI June 25, 2010. She still has PAC in place which is flushed q 6-8 weeks; note peripheral venous access is limited as she has had bilateral axillary dissections and radiation.  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 She is to see PCP Dr Lysbeth Galas next week.She has had no infectious illness, no new or different pain, appetite and energy good, no SOB, some pain left hand improved with mobic for 10 days and a brace for a month after I saw her last. Remainder of 10 point Review of Systems negative.  She is to see Dr Audie Box Feb 10 and will have regularly scheduled colonoscopy (~ 10 years) by Dr Juanda Chance in early March.  Objective:  Vital signs in last 24 hours:  BP 128/71  Pulse 76  Temp(Src) 97.3 F (36.3 C) (Oral)  Resp 20  Ht 5\' 4"  (1.626 m)  Wt 195 lb 8 oz (88.678 kg)  BMI 33.54 kg/m2  LMP 01/25/2004 Weight is up 3 lbs. Alert, easily ambulatory, looks  comfortable.   HEENT:PERRLA, extra ocular movement intact, sclera clear, anicteric and oropharynx clear, no lesions LymphaticsCervical, supraclavicular, and axillary nodes normal. Resp: clear to auscultation bilaterally and normal percussion bilaterally Cardio: regular rate and rhythm GI: soft, non-tender; bowel sounds normal; no masses,  no organomegaly Extremities: extremities normal, atraumatic, no cyanosis or edema Neuro:no sensory deficits noted Breasts: bilateral lumpectomy scars well healed, no dominant mass, no skin or nipple findings of concern, axillae and UE not remarkable. Portacath-without erythema or tenderness, flushed today  Lab Results:  Results for orders placed in visit on 08/27/12  CBC WITH DIFFERENTIAL      Result Value Range   WBC 5.0  3.9 - 10.3 10e3/uL   NEUT# 3.8  1.5 - 6.5 10e3/uL   HGB 12.1  11.6 - 15.9 g/dL   HCT 83.1  51.7 - 61.6 %   Platelets 221  145 - 400 10e3/uL   MCV 95.6  79.5 - 101.0 fL   MCH 32.6  25.1 - 34.0 pg   MCHC 34.1  31.5 - 36.0 g/dL   RBC 0.73  7.10 - 6.26 10e6/uL   RDW 13.4  11.2 - 14.5 %   lymph# 0.7 (*) 0.9 - 3.3 10e3/uL   MONO# 0.4  0.1 - 0.9 10e3/uL   Eosinophils Absolute 0.1  0.0 - 0.5 10e3/uL   Basophils Absolute 0.0  0.0 - 0.1 10e3/uL   NEUT% 75.1  38.4 - 76.8 %   LYMPH% 14.0  14.0 - 49.7 %  MONO% 8.8  0.0 - 14.0 %   EOS% 1.5  0.0 - 7.0 %   BASO% 0.6  0.0 - 2.0 %     Studies/Results:  No results found. Mammograms will be done at [facility] in March  Medications: I have reviewed the patient's current medications. She will continue arimidex.  Assessment/Plan:  1.Bilateral breast cancer, node postitve, history as above and continuing Arimidex. I will see her back in 3 months, or sooner if needed. She will be due mammograms in September which will be 6 months from her last one, and can consider MRI within 3 months after mammograms.  2.PAC in: flush q 8 wks.  3.degenerative arthritis   4.Bone density normal in  Feb 2013 tho down somewhat from 2010. With multiple other appointments upcoming and with normal range last year will not order this now. 5.flu vaccine done this fall 6.anxiety and intermittently more elevated BP: to see Dr Lysbeth Galas next week. BP not excessively elevated here today  Patient was in agreement with plan above. Zachery Dakins, MD   08/27/2012, 11:16 AM

## 2012-10-22 ENCOUNTER — Ambulatory Visit (HOSPITAL_BASED_OUTPATIENT_CLINIC_OR_DEPARTMENT_OTHER): Payer: Medicare Other

## 2012-10-22 VITALS — BP 130/61 | HR 80 | Temp 98.5°F | Resp 20

## 2012-10-22 DIAGNOSIS — C50419 Malignant neoplasm of upper-outer quadrant of unspecified female breast: Secondary | ICD-10-CM

## 2012-10-22 DIAGNOSIS — Z5111 Encounter for antineoplastic chemotherapy: Secondary | ICD-10-CM

## 2012-10-22 DIAGNOSIS — C50919 Malignant neoplasm of unspecified site of unspecified female breast: Secondary | ICD-10-CM

## 2012-10-22 MED ORDER — HEPARIN SOD (PORK) LOCK FLUSH 100 UNIT/ML IV SOLN
500.0000 [IU] | Freq: Once | INTRAVENOUS | Status: AC
  Administered 2012-10-22: 500 [IU] via INTRAVENOUS
  Filled 2012-10-22: qty 5

## 2012-10-22 MED ORDER — SODIUM CHLORIDE 0.9 % IJ SOLN
10.0000 mL | INTRAMUSCULAR | Status: DC | PRN
  Administered 2012-10-22: 10 mL via INTRAVENOUS
  Filled 2012-10-22: qty 10

## 2012-10-25 ENCOUNTER — Telehealth: Payer: Self-pay | Admitting: *Deleted

## 2012-10-25 ENCOUNTER — Telehealth: Payer: Self-pay | Admitting: Internal Medicine

## 2012-10-25 NOTE — Telephone Encounter (Signed)
Talked to pt and gave her appt for flush tomorrow @ 9 am per pt rqst

## 2012-10-25 NOTE — Telephone Encounter (Signed)
Call from pt reporting her port a cath has been tender since having it flushed on 9/29. Pt denies fever, reports no redness at site. Says area is "bluish". Pt requests appt to come in and have port a cath assessed. Appt given for 10/3 at 3:30. Will make provider aware.

## 2012-10-26 ENCOUNTER — Ambulatory Visit: Payer: Medicare Other

## 2012-10-26 NOTE — Progress Notes (Signed)
Port site assessed by Windy Kalata. Bruising noted at site but no swelling. Patient states some mild discomfort at site. Patient instructed to continue to monitor port site and to call if site becomes swollen or she has any increased pain.

## 2012-11-14 ENCOUNTER — Other Ambulatory Visit: Payer: Self-pay | Admitting: Oncology

## 2012-11-14 DIAGNOSIS — C50919 Malignant neoplasm of unspecified site of unspecified female breast: Secondary | ICD-10-CM

## 2012-12-03 ENCOUNTER — Ambulatory Visit (HOSPITAL_BASED_OUTPATIENT_CLINIC_OR_DEPARTMENT_OTHER): Payer: Medicare Other | Admitting: Internal Medicine

## 2012-12-03 ENCOUNTER — Ambulatory Visit (HOSPITAL_BASED_OUTPATIENT_CLINIC_OR_DEPARTMENT_OTHER): Payer: Medicare Other

## 2012-12-03 ENCOUNTER — Other Ambulatory Visit: Payer: Self-pay | Admitting: Internal Medicine

## 2012-12-03 ENCOUNTER — Encounter (INDEPENDENT_AMBULATORY_CARE_PROVIDER_SITE_OTHER): Payer: Self-pay

## 2012-12-03 ENCOUNTER — Ambulatory Visit: Payer: Medicare Other

## 2012-12-03 VITALS — BP 141/60 | HR 79 | Temp 97.5°F | Resp 18 | Ht 64.0 in | Wt 197.3 lb

## 2012-12-03 DIAGNOSIS — C50919 Malignant neoplasm of unspecified site of unspecified female breast: Secondary | ICD-10-CM

## 2012-12-03 DIAGNOSIS — Z23 Encounter for immunization: Secondary | ICD-10-CM

## 2012-12-03 DIAGNOSIS — C50419 Malignant neoplasm of upper-outer quadrant of unspecified female breast: Secondary | ICD-10-CM

## 2012-12-03 DIAGNOSIS — Z853 Personal history of malignant neoplasm of breast: Secondary | ICD-10-CM

## 2012-12-03 DIAGNOSIS — Z170 Estrogen receptor positive status [ER+]: Secondary | ICD-10-CM

## 2012-12-03 LAB — CBC WITH DIFFERENTIAL/PLATELET
BASO%: 0.6 % (ref 0.0–2.0)
EOS%: 0.5 % (ref 0.0–7.0)
Eosinophils Absolute: 0 10*3/uL (ref 0.0–0.5)
LYMPH%: 16 % (ref 14.0–49.7)
MCH: 32.4 pg (ref 25.1–34.0)
MCHC: 33.5 g/dL (ref 31.5–36.0)
MCV: 96.6 fL (ref 79.5–101.0)
MONO%: 9.5 % (ref 0.0–14.0)
NEUT#: 3.6 10*3/uL (ref 1.5–6.5)
NEUT%: 73.4 % (ref 38.4–76.8)
Platelets: 230 10*3/uL (ref 145–400)
RBC: 3.82 10*6/uL (ref 3.70–5.45)

## 2012-12-03 LAB — COMPREHENSIVE METABOLIC PANEL (CC13)
ALT: 20 U/L (ref 0–55)
AST: 23 U/L (ref 5–34)
Albumin: 3.7 g/dL (ref 3.5–5.0)
Alkaline Phosphatase: 81 U/L (ref 40–150)
BUN: 14.3 mg/dL (ref 7.0–26.0)
Chloride: 103 mEq/L (ref 98–109)
Potassium: 3.5 mEq/L (ref 3.5–5.1)
Sodium: 139 mEq/L (ref 136–145)
Total Bilirubin: 1.21 mg/dL — ABNORMAL HIGH (ref 0.20–1.20)
Total Protein: 7.5 g/dL (ref 6.4–8.3)

## 2012-12-03 MED ORDER — INFLUENZA VAC SPLIT QUAD 0.5 ML IM SUSP
0.5000 mL | INTRAMUSCULAR | Status: AC
  Administered 2012-12-03: 0.5 mL via INTRAMUSCULAR
  Filled 2012-12-03: qty 0.5

## 2012-12-03 MED ORDER — SODIUM CHLORIDE 0.9 % IJ SOLN
10.0000 mL | INTRAMUSCULAR | Status: DC | PRN
  Administered 2012-12-03: 10 mL via INTRAVENOUS
  Filled 2012-12-03: qty 10

## 2012-12-03 MED ORDER — HEPARIN SOD (PORK) LOCK FLUSH 100 UNIT/ML IV SOLN
500.0000 [IU] | Freq: Once | INTRAVENOUS | Status: AC
  Administered 2012-12-03: 500 [IU] via INTRAVENOUS
  Filled 2012-12-03: qty 5

## 2012-12-03 NOTE — Progress Notes (Signed)
[facility] OFFICE PROGRESS NOTE  Alison Hector, MD [address] [place] [address]  DIAGNOSIS: Breast cancer, unspecified laterality - Plan: CBC with Differential, Comprehensive metabolic panel  Chief Complaint  Patient presents with  . Breast cancer, unspecified laterality    CURRENT THERAPY: Arimidex 1 mg daily since November 2010.   INTERVAL HISTORY: Alison [name] 72 y.o. female  history of bilateral breast carcinoma, continuing aromatase inhibitor(arimidex) is here for follow-up. She was last seen by Dr. Eli [name] on 08/27/2012.  She still has PAC in, which is kept flushed q 8 weeks. She requests the flu shot.  Still has some occasional numbness in the right side (chronic); problems with knees bilaterally secondary to arthritis; and she had an episode of shingles 2 years ago on left flank with occasional pruritus.  She has been on arimidex for the past four years.   She has had no infectious illness, no new or different pain, appetite and energy good, no SOB, some pain left hand improved with mobic for 10 days and a brace for a month after I saw her last. She had a colonoscopy by Dr. Juanda [name] this past March.   MEDICAL HISTORY: Past Medical History  Diagnosis Date  . Breast cancer     X2  . Lichen sclerosus   . Glaucoma(365)   . Hypertension   . Hyperlipidemia   . Shingles 2012  . Arthritis   . Cervical dysplasia     INTERIM HISTORY: has Shingles; Breast cancer; Lichen sclerosus; Tendinitis, de Quervain's; and Arthritis on her problem list.    ALLERGIES:  has No Known Allergies.  MEDICATIONS: has a current medication list which includes the following prescription(s): anastrozole, aspirin, biotin, calcium carbonate-vitamin d, clobetasol cream, escitalopram, flaxseed (linseed), losartan-hydrochlorothiazide, omega-3 fatty acids, rosuvastatin, and travoprost (benzalkonium), and the following Facility-Administered Medications: sodium  chloride.  SURGICAL HISTORY:  Past Surgical History  Procedure Laterality Date  . Hysteroscopy    . Tubal ligation    . Breast surgery      Lumpectomy right and left  . Gynecologic cryosurgery     ONCOLOGY HISTORY: Patient most recently had T1bN2 (6 of 23 nodes) grade 1 invasive ductal carcinoma of right breast ER/PR + and HER2 - diagnosed spring 2010. She had adjuvant taxotere/cytoxan, local radiation and continues aromatase inhibtor which was begun in Nov.2010. Past history is significant for T2 left breast cancer with micrometastatic involvement of sentinel node in 1998, treated with adjuvant adria/cytoxan/taxol, radiation and 2 1/2 years of tamoxifen. Last bone density scan was [facility] 02-24-11, still in normal range but down from 2010.Marland Kitchen Last bilateral mammograms were at [facility] 04-07-11 and last breast MRI June 25, 2010. She still has PAC in place which is flushed q 6-8 weeks; note peripheral venous access is limited as she has had bilateral axillary dissections and radiation.   REVIEW OF SYSTEMS:   Constitutional: Denies fevers, chills or abnormal weight loss Eyes: Denies blurriness of vision Ears, nose, mouth, throat, and face: Denies mucositis or sore throat Respiratory: Denies cough, dyspnea or wheezes Cardiovascular: Denies palpitation, chest discomfort or lower extremity swelling Gastrointestinal:  Denies nausea, heartburn or change in bowel habits Skin: Denies abnormal skin rashes Lymphatics: Denies new lymphadenopathy or easy bruising Neurological:Denies numbness, tingling or new weaknesses Behavioral/Psych: Mood is stable, no new changes  All other systems were reviewed with the patient and are negative.  PHYSICAL EXAMINATION: ECOG PERFORMANCE STATUS: 0 - Asymptomatic  Blood pressure 141/60, pulse 79, temperature  97.5 F (36.4 C), temperature source Oral, resp. rate 18, height 5\' 4"  (1.626 m), weight 197 lb 4.8 oz (89.495 kg), last menstrual period  01/25/2004.  GENERAL:alert, no distress and comfortable SKIN: skin color, texture, turgor are normal, no rashes or significant lesions; L Portacath-without erythema or tenderness, flushed today EYES: normal, Conjunctiva are pink and non-injected, sclera clear OROPHARYNX:no exudate, no erythema and lips, buccal mucosa, and tongue normal  NECK: supple, thyroid normal size, non-tender, without nodularity LYMPH:  no palpable lymphadenopathy in the cervical, axillary or supraclavicular LUNGS: clear to auscultation and percussion with normal breathing effort HEART: regular rate & rhythm and no murmurs and no lower extremity edema ABDOMEN:abdomen soft, non-tender and normal bowel sounds Musculoskeletal:no cyanosis of digits and no clubbing  Breasts: bilateral lumpectomy scars well healed, no dominant mass, no skin or nipple findings of concern, axillae and UE not remarkable.  NEURO: alert & oriented x 3 with fluent speech, no focal motor/sensory deficits  Labs:  Lab Results  Component Value Date   WBC 5.0 08/27/2012   HGB 12.1 08/27/2012   HCT 35.4 08/27/2012   MCV 95.6 08/27/2012   PLT 221 08/27/2012   NEUTROABS 3.8 08/27/2012      Chemistry      Component Value Date/Time   NA 142 08/27/2012 1028   NA 145 09/12/2011 1132   K 3.5 08/27/2012 1028   K 3.5 09/12/2011 1132   CL 103 02/27/2012 1119   CL 104 09/12/2011 1132   CO2 27 08/27/2012 1028   CO2 26 09/12/2011 1132   BUN 14.5 08/27/2012 1028   BUN 21 09/12/2011 1132   CREATININE 0.8 08/27/2012 1028   CREATININE 0.85 09/12/2011 1132      Component Value Date/Time   CALCIUM 9.4 08/27/2012 1028   CALCIUM 9.5 09/12/2011 1132   ALKPHOS 76 08/27/2012 1028   ALKPHOS 72 09/12/2011 1132   AST 19 08/27/2012 1028   AST 23 09/12/2011 1132   ALT 16 08/27/2012 1028   ALT 20 09/12/2011 1132   BILITOT 1.24* 08/27/2012 1028   BILITOT 1.3* 09/12/2011 1132     RADIOGRAPHIC STUDIES:04/06/2012 Clinical Data: Personal history of bilateral breast cancer status post bilateral  lumpectomy. Right lumpectomy 2010, left lumpectomy 1999.  DIGITAL DIAGNOSTIC BILATERAL MAMMOGRAM WITH CAD  Comparison: March 28, 2011, March 26, 2010, March 12, 2009  Findings:  ACR Breast Density Category heterogeneously dense  CC and MLO views of bilateral breasts are submitted. Stable postsurgical changes are identified in both breasts. No suspicious abnormality is identified bilaterally.  Mammographic images were processed with CAD.  IMPRESSION:  Benign findings.  RECOMMENDATION:  Bilateral diagnostic mammogram in 1 year.  I have discussed the findings and recommendations with the patient.  Results were also provided in writing at the conclusion of the visit.  BI-RADS CATEGORY 2: Benign finding(s).  ASSESSMENT: Alison [name] 72 y.o. female with a history of Breast cancer, unspecified laterality - Plan: CBC with Differential, Comprehensive metabolic panel   PLAN:  1.Bilateral breast cancer, node postitve, history as above and continuing Arimidex. I will see her back in 3 months, or sooner if needed. She will be due mammograms in March 2015.   2.PAC in: flush q 8 wks.  3.Degenerative arthritis  4.Bone density normal in Feb 2013 tho down somewhat from 2010. We will repeat scan next visit. Continue calcium carbonate-vitamin D daily.  5.flu vaccine done today  All questions were answered. The patient knows to call the clinic with any problems, questions or  concerns. We can certainly see the patient much sooner if necessary.  I spent 10 minutes counseling the patient face to face. The total time spent in the appointment was 15 minutes.    Rubel Heckard, MD 12/03/2012 11:44 AM

## 2013-01-28 ENCOUNTER — Ambulatory Visit (HOSPITAL_BASED_OUTPATIENT_CLINIC_OR_DEPARTMENT_OTHER): Payer: Medicare Other

## 2013-01-28 VITALS — BP 125/66 | HR 92 | Temp 99.0°F

## 2013-01-28 DIAGNOSIS — C50419 Malignant neoplasm of upper-outer quadrant of unspecified female breast: Secondary | ICD-10-CM

## 2013-01-28 DIAGNOSIS — Z452 Encounter for adjustment and management of vascular access device: Secondary | ICD-10-CM

## 2013-01-28 DIAGNOSIS — Z95828 Presence of other vascular implants and grafts: Secondary | ICD-10-CM

## 2013-01-28 MED ORDER — HEPARIN SOD (PORK) LOCK FLUSH 100 UNIT/ML IV SOLN
500.0000 [IU] | Freq: Once | INTRAVENOUS | Status: AC
  Administered 2013-01-28: 500 [IU] via INTRAVENOUS
  Filled 2013-01-28: qty 5

## 2013-01-28 MED ORDER — SODIUM CHLORIDE 0.9 % IJ SOLN
10.0000 mL | INTRAMUSCULAR | Status: DC | PRN
  Administered 2013-01-28: 10 mL via INTRAVENOUS
  Filled 2013-01-28: qty 10

## 2013-01-28 NOTE — Patient Instructions (Signed)
Implanted Port Instructions  An implanted port is a central line that has a round shape and is placed under the skin. It is used for long-term IV (intravenous) access for:  · Medicine.  · Fluids.  · Liquid nutrition, such as TPN (total parenteral nutrition).  · Blood samples.  Ports can be placed:  · In the chest area just below the collarbone (this is the most common place.)  · In the arms.  · In the belly (abdomen) area.  · In the legs.  PARTS OF THE PORT  A port has 2 main parts:  · The reservoir. The reservoir is round, disc-shaped, and will be a small, raised area under your skin.  · The reservoir is the part where a needle is inserted (accessed) to either give medicines or to draw blood.  · The catheter. The catheter is a long, slender tube that extends from the reservoir. The catheter is placed into a large vein.  · Medicine that is inserted into the reservoir goes into the catheter and then into the vein.  INSERTION OF THE PORT  · The port is surgically placed in either an operating room or in a procedural area (interventional radiology).  · Medicine may be given to help you relax during the procedure.  · The skin where the port will be inserted is numbed (local anesthetic).  · 1 or 2 small cuts (incisions) will be made in the skin to insert the port.  · The port can be used after it has been inserted.  INCISION SITE CARE  · The incision site may have small adhesive strips on it. This helps keep the incision site closed. Sometimes, no adhesive strips are placed. Instead of adhesive strips, a special kind of surgical glue is used to keep the incision closed.  · If adhesive strips were placed on the incision sites, do not take them off. They will fall off on their own.  · The incision site may be sore for 1 to 2 days. Pain medicine can help.  · Do not get the incision site wet. Bathe or shower as directed by your caregiver.  · The incision site should heal in 5 to 7 days. A small scar may form after the  incision has healed.  ACCESSING THE PORT  Special steps must be taken to access the port:  · Before the port is accessed, a numbing cream can be placed on the skin. This helps numb the skin over the port site.  · A sterile technique is used to access the port.  · The port is accessed with a needle. Only "non-coring" port needles should be used to access the port. Once the port is accessed, a blood return should be checked. This helps ensure the port is in the vein and is not clogged (clotted).  · If your caregiver believes your port should remain accessed, a clear (transparent) bandage will be placed over the needle site. The bandage and needle will need to be changed every week or as directed by your caregiver.  · Keep the bandage covering the needle clean and dry. Do not get it wet. Follow your caregiver's instructions on how to take a shower or bath when the port is accessed.  · If your port does not need to stay accessed, no bandage is needed over the port.  FLUSHING THE PORT  Flushing the port keeps it from getting clogged. How often the port is flushed depends on:  · If a   constant infusion is running. If a constant infusion is running, the port may not need to be flushed.  · If intermittent medicines are given.  · If the port is not being used.  For intermittent medicines:  · The port will need to be flushed:  · After medicines have been given.  · After blood has been drawn.  · As part of routine maintenance.  · A port is normally flushed with:  · Normal saline.  · Heparin.  · Follow your caregiver's advice on how often, how much, and the type of flush to use on your port.  IMPORTANT PORT INFORMATION  · Tell your caregiver if you are allergic to heparin.  · After your port is placed, you will get a manufacturer's information card. The card has information about your port. Keep this card with you at all times.  · There are many types of ports available. Know what kind of port you have.  · In case of an  emergency, it may be helpful to wear a medical alert bracelet. This can help alert health care workers that you have a port.  · The port can stay in for as long as your caregiver believes it is necessary.  · When it is time for the port to come out, surgery will be done to remove it. The surgery will be similar to how the port was put in.  · If you are in the hospital or clinic:  · Your port will be taken care of and flushed by a nurse.  · If you are at home:  · A home health care nurse may give medicines and take care of the port.  · You or a family member can get special training and directions for giving medicine and taking care of the port at home.  SEEK IMMEDIATE MEDICAL CARE IF:   · Your port does not flush or you are unable to get a blood return.  · New drainage or pus is coming from the incision.  · A bad smell is coming from the incision site.  · You develop swelling or increased redness at the incision site.  · You develop increased swelling or pain at the port site.  · You develop swelling or pain in the surrounding skin near the port.  · You have an oral temperature above 102° F (38.9° C), not controlled by medicine.  MAKE SURE YOU:   · Understand these instructions.  · Will watch your condition.  · Will get help right away if you are not doing well or get worse.  Document Released: 01/10/2005 Document Revised: 04/04/2011 Document Reviewed: 04/03/2008  [employer]® Patient Information ©2014 [employer].

## 2013-01-31 ENCOUNTER — Emergency Department
Admission: EM | Admit: 2013-01-31 | Discharge: 2013-01-31 | Disposition: A | Discharge status: Home / Self Care | Payer: Medicare Other | Source: Home / Self Care | Attending: Emergency Medicine | Admitting: Emergency Medicine

## 2013-01-31 ENCOUNTER — Ambulatory Visit: Payer: Medicare Other | Admitting: Sports Medicine

## 2013-01-31 ENCOUNTER — Encounter: Payer: Self-pay | Admitting: Emergency Medicine

## 2013-01-31 DIAGNOSIS — J0100 Acute maxillary sinusitis, unspecified: Secondary | ICD-10-CM

## 2013-01-31 MED ORDER — FLUTICASONE PROPIONATE 50 MCG/ACT NA SUSP: NASAL | Status: DC

## 2013-01-31 MED ORDER — AMOXICILLIN 875 MG PO TABS: 875.0000 mg | ORAL_TABLET | Freq: Two times a day (BID) | ORAL | Status: DC

## 2013-01-31 NOTE — ED Notes (Signed)
Alison [name] c/o facial/sinus pain, chills, and HA x 3 days. Received flu vac this season.

## 2013-01-31 NOTE — ED Provider Notes (Signed)
CSN: [id number]     Arrival date & time 01/31/13  1410 History   First MD Initiated Contact with Patient 01/31/13 1410     Chief Complaint  Patient presents with  . Facial Pain   (Consider location/radiation/quality/duration/timing/severity/associated sxs/prior Treatment) HPI URI HISTORY  Alison [name] is a 73 y.o. female who complains of onset of cold symptoms for 4 days.  Have been using over-the-counter treatment which helps a little bit.  + chills/sweats +  Fever  +  Nasal congestion +  Discolored Post-nasal drainage + sinus pain/pressure, and she can feel it in her teeth bilaterally No sore throat  +  Minimal cough No wheezing No chest congestion No hemoptysis No shortness of breath No pleuritic pain  No itchy/red eyes No earache  No nausea No vomiting No abdominal pain No diarrhea  No skin rashes +  Fatigue Mild frontal sinus headache  No myalgias Past Medical History  Diagnosis Date  . Breast cancer     X2  . Lichen sclerosus   . Glaucoma   . Hypertension   . Hyperlipidemia   . Shingles 2012  . Arthritis   . Cervical dysplasia    Past Surgical History  Procedure Laterality Date  . Hysteroscopy    . Tubal ligation    . Breast surgery      Lumpectomy right and left  . Gynecologic cryosurgery     Family History  Problem Relation Age of Onset  . Hypertension Mother   . Cancer Mother     colon  . Colon cancer Mother   . Diabetes Sister   . Heart disease Father   . Breast cancer Maternal Aunt 50   History  Substance Use Topics  . Smoking status: Never Smoker   . Smokeless tobacco: Never Used  . Alcohol Use: No     Comment: Rare   OB History   Grav Para Term Preterm Abortions TAB SAB Ect Mult Living   3 3 3       3      Review of Systems  All other systems reviewed and are negative.    Allergies  Review of patient's allergies indicates no known allergies.  Home Medications   Current Outpatient Rx  Name  Route  Sig  Dispense  Refill   . anastrozole (ARIMIDEX) 1 MG tablet      TAKE ONE TABLET BY MOUTH ONCE DAILY   90 tablet   0     Dr. Evlyn Clines is no longer the provider for t ...   . aspirin 81 MG tablet   Oral   Take 81 mg by mouth daily.           . Biotin 1000 MCG tablet   Oral   Take 1,000 mcg by mouth daily.           Marland Kitchen losartan-hydrochlorothiazide (HYZAAR) 100-25 MG per tablet   Oral   Take 100 tablets by mouth daily.          . rosuvastatin (CRESTOR) 10 MG tablet   Oral   Take 10 mg by mouth daily.           . travoprost, benzalkonium, (TRAVATAN) 0.004 % ophthalmic solution      1 drop at bedtime.           Marland Kitchen amoxicillin (AMOXIL) 875 MG tablet   Oral   Take 1 tablet (875 mg total) by mouth 2 (two) times daily. Take for 10 days.   20 tablet  0   . Calcium Carbonate-Vitamin D (CALCIUM + D PO)   Oral   Take by mouth.           . clobetasol cream (TEMOVATE) 0.05 %      Apply nightly as needed for itching   30 g   1   . escitalopram (LEXAPRO) 10 MG tablet   Oral   Take 10 mg by mouth daily.         . Flaxseed, Linseed, 1000 MG CAPS   Oral   Take by mouth.         . fluticasone (FLONASE) 50 MCG/ACT nasal spray      1 or 2 sprays each nostril twice a day   16 g   0   . Omega-3 Fatty Acids (FISH OIL PO)   Oral   Take by mouth.            BP 133/77  Pulse 82  Temp(Src) 99.2 F (37.3 C) (Oral)  Resp 14  Ht 5' 4.5" (1.638 m)  Wt 192 lb (87.091 kg)  BMI 32.46 kg/m2  SpO2 99%  LMP 01/25/2004 Physical Exam  Nursing note and vitals reviewed. Constitutional: She is oriented to person, place, and time. She appears well-developed and well-nourished. No distress.  HENT:  Head: Normocephalic and atraumatic.  Right Ear: Tympanic membrane, external ear and ear canal normal.  Left Ear: Tympanic membrane, external ear and ear canal normal.  Nose: Mucosal edema and rhinorrhea present. Right sinus exhibits maxillary sinus tenderness. Left sinus exhibits  maxillary sinus tenderness.  Mouth/Throat: Oropharynx is clear and moist. No oral lesions. No oropharyngeal exudate.  Eyes: Right eye exhibits no discharge. Left eye exhibits no discharge. No scleral icterus.  Neck: Neck supple.  Cardiovascular: Normal rate, regular rhythm and normal heart sounds.   Pulmonary/Chest: Effort normal and breath sounds normal. She has no wheezes. She has no rales.  Lymphadenopathy:    She has no cervical adenopathy.  Neurological: She is alert and oriented to person, place, and time.  Skin: Skin is warm and dry.    ED Course  Procedures (including critical care time) Labs Review Labs Reviewed - No data to display Imaging Review No results found.  EKG Interpretation    Date/Time:    Ventricular Rate:    PR Interval:    QRS Duration:   QT Interval:    QTC Calculation:   R Axis:     Text Interpretation:              MDM   1. Acute maxillary sinusitis    Treatment options discussed, as well as risks, benefits, alternatives. Patient voiced understanding and agreement with the following plans: Amox. Flonase. Precautions discussed. Red flags discussed. Questions invited and answered. Patient voiced understanding and agreement.     Jacqulyn Cane, MD 01/31/13 [phone]

## 2013-02-14 ENCOUNTER — Other Ambulatory Visit: Payer: Self-pay | Admitting: Internal Medicine

## 2013-02-14 DIAGNOSIS — C50911 Malignant neoplasm of unspecified site of right female breast: Secondary | ICD-10-CM

## 2013-02-14 DIAGNOSIS — C50912 Malignant neoplasm of unspecified site of left female breast: Principal | ICD-10-CM

## 2013-04-02 ENCOUNTER — Telehealth: Payer: Self-pay | Admitting: Internal Medicine

## 2013-04-02 ENCOUNTER — Ambulatory Visit (HOSPITAL_BASED_OUTPATIENT_CLINIC_OR_DEPARTMENT_OTHER): Payer: Medicare HMO | Admitting: Internal Medicine

## 2013-04-02 ENCOUNTER — Ambulatory Visit (HOSPITAL_BASED_OUTPATIENT_CLINIC_OR_DEPARTMENT_OTHER): Payer: Medicare HMO

## 2013-04-02 ENCOUNTER — Other Ambulatory Visit: Payer: Self-pay | Admitting: Hematology and Oncology

## 2013-04-02 ENCOUNTER — Other Ambulatory Visit (HOSPITAL_BASED_OUTPATIENT_CLINIC_OR_DEPARTMENT_OTHER): Payer: Medicare HMO

## 2013-04-02 VITALS — BP 136/67 | HR 74 | Temp 98.2°F | Resp 20 | Ht 64.5 in | Wt 189.0 lb

## 2013-04-02 DIAGNOSIS — C50419 Malignant neoplasm of upper-outer quadrant of unspecified female breast: Secondary | ICD-10-CM

## 2013-04-02 DIAGNOSIS — C50919 Malignant neoplasm of unspecified site of unspecified female breast: Secondary | ICD-10-CM

## 2013-04-02 DIAGNOSIS — Z853 Personal history of malignant neoplasm of breast: Secondary | ICD-10-CM

## 2013-04-02 DIAGNOSIS — Z95828 Presence of other vascular implants and grafts: Secondary | ICD-10-CM

## 2013-04-02 LAB — CBC WITH DIFFERENTIAL/PLATELET
BASO%: 1.1 % (ref 0.0–2.0)
Basophils Absolute: 0.1 10*3/uL (ref 0.0–0.1)
EOS ABS: 0.2 10*3/uL (ref 0.0–0.5)
EOS%: 4.9 % (ref 0.0–7.0)
HCT: 35.9 % (ref 34.8–46.6)
HEMOGLOBIN: 12.1 g/dL (ref 11.6–15.9)
LYMPH#: 0.7 10*3/uL — AB (ref 0.9–3.3)
LYMPH%: 14.6 % (ref 14.0–49.7)
MCH: 32.9 pg (ref 25.1–34.0)
MCHC: 33.6 g/dL (ref 31.5–36.0)
MCV: 97.7 fL (ref 79.5–101.0)
MONO#: 0.5 10*3/uL (ref 0.1–0.9)
MONO%: 11.8 % (ref 0.0–14.0)
NEUT#: 3.1 10*3/uL (ref 1.5–6.5)
NEUT%: 67.6 % (ref 38.4–76.8)
Platelets: 217 10*3/uL (ref 145–400)
RBC: 3.67 10*6/uL — ABNORMAL LOW (ref 3.70–5.45)
RDW: 13.9 % (ref 11.2–14.5)
WBC: 4.5 10*3/uL (ref 3.9–10.3)

## 2013-04-02 LAB — COMPREHENSIVE METABOLIC PANEL (CC13)
ALBUMIN: 3.6 g/dL (ref 3.5–5.0)
ALT: 21 U/L (ref 0–55)
AST: 23 U/L (ref 5–34)
Alkaline Phosphatase: 80 U/L (ref 40–150)
Anion Gap: 10 mEq/L (ref 3–11)
BUN: 16.4 mg/dL (ref 7.0–26.0)
CHLORIDE: 105 meq/L (ref 98–109)
CO2: 27 mEq/L (ref 22–29)
Calcium: 9.4 mg/dL (ref 8.4–10.4)
Creatinine: 0.8 mg/dL (ref 0.6–1.1)
Glucose: 81 mg/dl (ref 70–140)
POTASSIUM: 3.6 meq/L (ref 3.5–5.1)
Sodium: 142 mEq/L (ref 136–145)
TOTAL PROTEIN: 7.2 g/dL (ref 6.4–8.3)
Total Bilirubin: 0.9 mg/dL (ref 0.20–1.20)

## 2013-04-02 MED ORDER — SODIUM CHLORIDE 0.9 % IJ SOLN
10.0000 mL | INTRAMUSCULAR | Status: DC | PRN
  Administered 2013-04-02: 10 mL via INTRAVENOUS
  Filled 2013-04-02: qty 10

## 2013-04-02 MED ORDER — HEPARIN SOD (PORK) LOCK FLUSH 100 UNIT/ML IV SOLN
500.0000 [IU] | Freq: Once | INTRAVENOUS | Status: AC
  Administered 2013-04-02: 500 [IU] via INTRAVENOUS
  Filled 2013-04-02: qty 5

## 2013-04-02 NOTE — Progress Notes (Signed)
[facility] NOTE  Alison Mustache, MD [place] [id number]  DIAGNOSIS: Breast cancer  Chief Complaint  Patient presents with  . Breast cancer, unspecified laterality    CURRENT THERAPY: Arimidex 1 mg daily since November 2010.   INTERVAL HISTORY: Alison [name] 73 y.o. female  history of bilateral breast carcinoma, continuing aromatase inhibitor(arimidex) is here for follow-up. She was last seen by me on 12/03/2012.  She still has PAC in, which is kept flushed q 8 weeks. Still has some occasional numbness in the right side (chronic); problems with knees bilaterally secondary to arthritis; and she had an episode of shingles 2 years ago on left flank with occasional pruritus.  She has been on arimidex for the past four years.   She has had no infectious illness, no new or different pain, appetite and energy good, no SOB, some pain left hand improved with mobic for 10 days and a brace for a month after I saw her last. She had a colonoscopy by Dr. [name] this past March.   MEDICAL HISTORY: Past Medical History  Diagnosis Date  . Breast cancer     X2  . Lichen sclerosus   . Glaucoma   . Hypertension   . Hyperlipidemia   . Shingles 2012  . Arthritis   . Cervical dysplasia     INTERIM HISTORY: has Shingles; Breast cancer; Lichen sclerosus; Tendinitis, de Quervain's; and Arthritis on her problem list.    ALLERGIES:  has No Known Allergies.  MEDICATIONS: has a current medication list which includes the following prescription(s): anastrozole, aspirin, biotin, calcium carbonate-vitamin d, clobetasol cream, escitalopram, flaxseed (linseed), losartan-hydrochlorothiazide, omega-3 fatty acids, rosuvastatin, and travoprost (benzalkonium), and the following Facility-Administered Medications: sodium chloride.  SURGICAL HISTORY:  Past Surgical History  Procedure Laterality Date  . Hysteroscopy    . Tubal ligation    . Breast surgery     Lumpectomy right and left  . Gynecologic cryosurgery     ONCOLOGY HISTORY: Patient most recently had T1bN2 (6 of 23 nodes) grade 1 invasive ductal carcinoma of right breast ER/PR + and HER2 - diagnosed spring 2010. She had adjuvant taxotere/cytoxan, local radiation and continues aromatase inhibtor which was begun in Nov.2010. Past history is significant for T2 left breast cancer with micrometastatic involvement of sentinel node in 1998, treated with adjuvant adria/cytoxan/taxol, radiation and 2 1/2 years of tamoxifen. Last bone density scan was at [facility] 02-24-11, still in normal range but down from 2010.Marland Kitchen Last bilateral mammograms were at [facility] 04-07-11 and last breast MRI June 25, 2010. She still has PAC in place which is flushed q 6-8 weeks; note peripheral venous access is limited as she has had bilateral axillary dissections and radiation.   REVIEW OF SYSTEMS:   Constitutional: Denies fevers, chills or abnormal weight loss Eyes: Denies blurriness of vision Ears, nose, mouth, throat, and face: Denies mucositis or sore throat Respiratory: Denies cough, dyspnea or wheezes Cardiovascular: Denies palpitation, chest discomfort or lower extremity swelling Gastrointestinal:  Denies nausea, heartburn or change in bowel habits Skin: Denies abnormal skin rashes Lymphatics: Denies new lymphadenopathy or easy bruising Neurological:Denies numbness, tingling or new weaknesses Behavioral/Psych: Mood is stable, no new changes  All other systems were reviewed with the patient and are negative.  PHYSICAL EXAMINATION: ECOG PERFORMANCE STATUS: 0 - Asymptomatic  Blood pressure 136/67, pulse 74, temperature 98.2 F (36.8 C), temperature source Oral, resp. rate 20, height 5' 4.5" (1.638 m), weight 189 lb (85.73 kg),  last menstrual period 01/25/2004, SpO2 98.00%.  GENERAL:alert, no distress and comfortable SKIN: skin color, texture, turgor are normal, no rashes or significant lesions; L  Portacath-without erythema or tenderness, flushed today EYES: normal, Conjunctiva are pink and non-injected, sclera clear OROPHARYNX:no exudate, no erythema and lips, buccal mucosa, and tongue normal  NECK: supple, thyroid normal size, non-tender, without nodularity LYMPH:  no palpable lymphadenopathy in the cervical, axillary or supraclavicular LUNGS: clear to auscultation and percussion with normal breathing effort HEART: regular rate & rhythm and no murmurs and no lower extremity edema ABDOMEN:abdomen soft, non-tender and normal bowel sounds Musculoskeletal:no cyanosis of digits and no clubbing  Breasts: bilateral lumpectomy scars well healed, no dominant mass, no skin or nipple findings of concern, axillae and UE not remarkable.  NEURO: alert & oriented x 3 with fluent speech, no focal motor/sensory deficits  Labs:  Lab Results  Component Value Date   WBC 4.5 04/02/2013   HGB 12.1 04/02/2013   HCT 35.9 04/02/2013   MCV 97.7 04/02/2013   PLT 217 04/02/2013   NEUTROABS 3.1 04/02/2013      Chemistry      Component Value Date/Time   NA 139 12/03/2012 1151   NA 145 09/12/2011 1132   K 3.5 12/03/2012 1151   K 3.5 09/12/2011 1132   CL 103 02/27/2012 1119   CL 104 09/12/2011 1132   CO2 26 12/03/2012 1151   CO2 26 09/12/2011 1132   BUN 14.3 12/03/2012 1151   BUN 21 09/12/2011 1132   CREATININE 0.7 12/03/2012 1151   CREATININE 0.85 09/12/2011 1132      Component Value Date/Time   CALCIUM 9.9 12/03/2012 1151   CALCIUM 9.5 09/12/2011 1132   ALKPHOS 81 12/03/2012 1151   ALKPHOS 72 09/12/2011 1132   AST 23 12/03/2012 1151   AST 23 09/12/2011 1132   ALT 20 12/03/2012 1151   ALT 20 09/12/2011 1132   BILITOT 1.21* 12/03/2012 1151   BILITOT 1.3* 09/12/2011 1132     RADIOGRAPHIC STUDIES:04/06/2012 Clinical Data: Personal history of bilateral breast cancer status post bilateral lumpectomy. Right lumpectomy 2010, left lumpectomy 1999.  DIGITAL DIAGNOSTIC BILATERAL MAMMOGRAM WITH CAD  Comparison:  March 28, 2011, March 26, 2010, March 12, 2009  Findings:  ACR Breast Density Category heterogeneously dense  CC and MLO views of bilateral breasts are submitted. Stable postsurgical changes are identified in both breasts. No suspicious abnormality is identified bilaterally.  Mammographic images were processed with CAD.  IMPRESSION:  Benign findings.  RECOMMENDATION:  Bilateral diagnostic mammogram in 1 year.  I have discussed the findings and recommendations with the patient.  Results were also provided in writing at the conclusion of the visit.  BI-RADS CATEGORY 2: Benign finding(s).  ASSESSMENT: Alison [name] 73 y.o. female with a history of Breast cancer   PLAN:  1.Bilateral breast cancer, node postitve, history as above and continuing Arimidex. I will see her back in 3 months, or sooner if needed. She will be due mammograms later this month.   2.PAC in: flush q 8 wks.  3.Degenerative arthritis  4.Bone density normal in Feb 2013 tho down somewhat from 2010. We will repeat scan next visit. Continue calcium carbonate-vitamin D daily.   All questions were answered. The patient knows to call the clinic with any problems, questions or concerns. We can certainly see the patient much sooner if necessary.  I spent 10 minutes counseling the patient face to face. The total time spent in the appointment was 15 minutes.  Danitra Payano, MD 04/02/2013 10:19 AM

## 2013-04-02 NOTE — Patient Instructions (Signed)

## 2013-04-02 NOTE — Telephone Encounter (Signed)
gv pt appt schedule for april thru july. added flush appts.

## 2013-04-05 ENCOUNTER — Other Ambulatory Visit: Payer: Self-pay | Admitting: Internal Medicine

## 2013-04-05 ENCOUNTER — Other Ambulatory Visit: Payer: Self-pay | Admitting: Hematology and Oncology

## 2013-04-05 DIAGNOSIS — Z853 Personal history of malignant neoplasm of breast: Secondary | ICD-10-CM

## 2013-04-08 ENCOUNTER — Ambulatory Visit
Admission: RE | Admit: 2013-04-08 | Discharge: 2013-04-08 | Disposition: A | Discharge status: Home / Self Care | Payer: Medicare HMO | Source: Ambulatory Visit | Attending: Hematology and Oncology | Admitting: Hematology and Oncology

## 2013-04-08 DIAGNOSIS — Z853 Personal history of malignant neoplasm of breast: Secondary | ICD-10-CM

## 2013-04-11 ENCOUNTER — Ambulatory Visit (INDEPENDENT_AMBULATORY_CARE_PROVIDER_SITE_OTHER): Payer: Medicare HMO | Admitting: Sports Medicine

## 2013-04-11 ENCOUNTER — Encounter: Payer: Self-pay | Admitting: Sports Medicine

## 2013-04-11 VITALS — BP 139/72 | HR 76 | Ht 63.0 in | Wt 190.0 lb

## 2013-04-11 DIAGNOSIS — M654 Radial styloid tenosynovitis [de Quervain]: Secondary | ICD-10-CM

## 2013-04-11 NOTE — Progress Notes (Signed)
  Subjective:    CC: Left wrist pain  HPI: This is a very pleasant 73 year old female, 15 months ago I injected her first extensor compartment, she had a fantastic response, and has not had a recurrence of pain until now. He is moderate, persistent, localized over the first extensor compartment without radiation.  Past medical history, Surgical history, Family history not pertinant except as noted below, Social history, Allergies, and medications have been entered into the medical record, reviewed, and no changes needed.   Review of Systems: No fevers, chills, night sweats, weight loss, chest pain, or shortness of breath.   Objective:    General: Well Developed, well nourished, and in no acute distress.  Neuro: Alert and oriented x3, extra-ocular muscles intact, sensation grossly intact.  HEENT: Normocephalic, atraumatic, pupils equal round reactive to light, neck supple, no masses, no lymphadenopathy, thyroid nonpalpable.  Skin: Warm and dry, no rashes. Cardiac: Regular rate and rhythm, no murmurs rubs or gallops, no lower extremity edema.  Respiratory: Clear to auscultation bilaterally. Not using accessory muscles, speaking in full sentences. Left Wrist: Inspection normal with no visible erythema or swelling. ROM smooth and normal with good flexion and extension and ulnar/radial deviation that is symmetrical with opposite wrist. Tender to palpation over the first extensor compartment. No snuffbox tenderness. No tenderness over Canal of [place]. Strength 5/5 in all directions without pain. Positive Finkelstein sign. Negative Watson's test.  Procedure: Real-time Ultrasound Guided Injection of left first extensor compartment Device: GE Logiq E  Verbal informed consent obtained.  Time-out conducted.  Noted no overlying erythema, induration, or other signs of local infection.  Skin prepped in a sterile fashion.  Local anesthesia: Topical Ethyl chloride.  With sterile technique and  under real time ultrasound guidance:  25-gauge needle advanced between the abductor pollicis longus and extensor pollicis brevis, 1 cc Kenalog 40, 2 cc lidocaine injected easily. Completed without difficulty  Pain immediately resolved suggesting accurate placement of the medication.  Advised to call if fevers/chills, erythema, induration, drainage, or persistent bleeding.  Images permanently stored and available for review in the ultrasound unit.  Impression: Technically successful ultrasound guided injection.  Impression and Recommendations:

## 2013-04-11 NOTE — Assessment & Plan Note (Signed)
Left first extensor compartment as above, last injection was approximately 15 months ago.

## 2013-04-17 ENCOUNTER — Encounter: Payer: Self-pay | Admitting: Sports Medicine

## 2013-04-17 ENCOUNTER — Ambulatory Visit (INDEPENDENT_AMBULATORY_CARE_PROVIDER_SITE_OTHER): Payer: Medicare HMO | Admitting: Sports Medicine

## 2013-04-17 VITALS — BP 135/77 | HR 84 | Wt 187.0 lb

## 2013-04-17 DIAGNOSIS — M1710 Unilateral primary osteoarthritis, unspecified knee: Secondary | ICD-10-CM

## 2013-04-17 DIAGNOSIS — IMO0002 Reserved for concepts with insufficient information to code with codable children: Secondary | ICD-10-CM

## 2013-04-17 DIAGNOSIS — M654 Radial styloid tenosynovitis [de Quervain]: Secondary | ICD-10-CM

## 2013-04-17 DIAGNOSIS — M1711 Unilateral primary osteoarthritis, right knee: Secondary | ICD-10-CM

## 2013-04-17 DIAGNOSIS — M170 Bilateral primary osteoarthritis of knee: Secondary | ICD-10-CM | POA: Insufficient documentation

## 2013-04-17 NOTE — Progress Notes (Signed)
  Subjective:    CC: Followup and recheck knee  HPI: Left de Quervain's tendinitis: resolved after injection.  Right knee osteoarthritis : painful the medial joint line, moderate, persistent, had a steroid injection several years ago. She does gelling, very little mechanical symptoms.  Past medical history, Surgical history, Family history not pertinant except as noted below, Social history, Allergies, and medications have been entered into the medical record, reviewed, and no changes needed.   Review of Systems: No fevers, chills, night sweats, weight loss, chest pain, or shortness of breath.   Objective:    General: Well Developed, well nourished, and in no acute distress.  Neuro: Alert and oriented x3, extra-ocular muscles intact, sensation grossly intact.  HEENT: Normocephalic, atraumatic, pupils equal round reactive to light, neck supple, no masses, no lymphadenopathy, thyroid nonpalpable.  Skin: Warm and dry, no rashes. Cardiac: Regular rate and rhythm, no murmurs rubs or gallops, no lower extremity edema.  Respiratory: Clear to auscultation bilaterally. Not using accessory muscles, speaking in full sentences. Wrist: Inspection normal with no visible erythema or swelling. ROM smooth and normal with good flexion and extension and ulnar/radial deviation that is symmetrical with opposite wrist. Palpation is normal over metacarpals, navicular, lunate, and TFCC; tendons without tenderness/ swelling No snuffbox tenderness. No tenderness over Canal of [place]. Strength 5/5 in all directions without pain. Negative Finkelstein, tinel's and phalens. Negative Watson's test. Right Knee: Minimal effusion, mild tenderness to palpation along the medial joint line.  ROM full in flexion and extension and lower leg rotation. Ligaments with solid consistent endpoints including ACL, PCL, LCL, MCL. Negative Mcmurray's, Apley's, and Thessalonian tests. Non painful patellar compression. Patellar  glide without crepitus. Patellar and quadriceps tendons unremarkable. Hamstring and quadriceps strength is normal.   Procedure: Real-time Ultrasound Guided Injection of right knee Device: GE Logiq E  Verbal informed consent obtained.  Time-out conducted.  Noted no overlying erythema, induration, or other signs of local infection.  Skin prepped in a sterile fashion.  Local anesthesia: Topical Ethyl chloride.  With sterile technique and under real time ultrasound guidance:  2 cc Kenalog 40, 4 cc lidocaine injected easily into the suprapatellar recess, syringe switched and 25 mg/2.5 mL of Supartz (sodium hyaluronate) in a prefilled syringe was injected easily into the knee through a 22-gauge needle. Completed without difficulty  Pain immediately resolved suggesting accurate placement of the medication.  Advised to call if fevers/chills, erythema, induration, drainage, or persistent bleeding.  Images permanently stored and available for review in the ultrasound unit.  Impression: Technically successful ultrasound guided injection.  Impression and Recommendations:

## 2013-04-17 NOTE — Assessment & Plan Note (Signed)
Having pain for about a year now, she had a steroid injection 5 years prior. Repeat steroid injection, also starting viscous supplementation. Return in one week for Supartz injection # 2 of 5. I do need some baseline x-rays.

## 2013-04-17 NOTE — Assessment & Plan Note (Signed)
Pain-free after injection last week.

## 2013-04-25 ENCOUNTER — Encounter: Payer: Self-pay | Admitting: Sports Medicine

## 2013-04-25 ENCOUNTER — Ambulatory Visit (INDEPENDENT_AMBULATORY_CARE_PROVIDER_SITE_OTHER): Payer: Medicare HMO | Admitting: Sports Medicine

## 2013-04-25 VITALS — BP 134/74 | HR 69 | Ht 63.0 in | Wt 187.0 lb

## 2013-04-25 DIAGNOSIS — M1711 Unilateral primary osteoarthritis, right knee: Secondary | ICD-10-CM

## 2013-04-25 DIAGNOSIS — M1710 Unilateral primary osteoarthritis, unspecified knee: Secondary | ICD-10-CM

## 2013-04-25 DIAGNOSIS — IMO0002 Reserved for concepts with insufficient information to code with codable children: Secondary | ICD-10-CM

## 2013-04-25 DIAGNOSIS — M654 Radial styloid tenosynovitis [de Quervain]: Secondary | ICD-10-CM

## 2013-04-25 NOTE — Assessment & Plan Note (Signed)
Continues to be resolved after injection. 

## 2013-04-25 NOTE — Assessment & Plan Note (Signed)
Supartz injection #2 of 5 as above. Return in one week for # 3 of 5.

## 2013-04-25 NOTE — Progress Notes (Signed)
   Procedure: Real-time Ultrasound Guided Injection of right knee Device: GE Logiq E  Verbal informed consent obtained.  Time-out conducted.  Noted no overlying erythema, induration, or other signs of local infection.  Skin prepped in a sterile fashion.  Local anesthesia: Topical Ethyl chloride.  With sterile technique and under real time ultrasound guidance:  25 mg/2.5 mL of Supartz (sodium hyaluronate) in a prefilled syringe was injected easily into the knee through a 22-gauge needle.  Completed without difficulty  Pain immediately resolved suggesting accurate placement of the medication.  Advised to call if fevers/chills, erythema, induration, drainage, or persistent bleeding.  Images permanently stored and available for review in the ultrasound unit.  Impression: Technically successful ultrasound guided injection. 

## 2013-05-01 ENCOUNTER — Encounter: Payer: Self-pay | Admitting: Gynecology

## 2013-05-03 ENCOUNTER — Telehealth: Payer: Self-pay | Admitting: Internal Medicine

## 2013-05-03 NOTE — Telephone Encounter (Signed)
per pt rqst moved from 4/21 to 4/20

## 2013-05-08 ENCOUNTER — Encounter: Payer: Self-pay | Admitting: Gynecology

## 2013-05-13 ENCOUNTER — Encounter: Payer: Self-pay | Admitting: Sports Medicine

## 2013-05-13 ENCOUNTER — Ambulatory Visit (INDEPENDENT_AMBULATORY_CARE_PROVIDER_SITE_OTHER): Payer: Medicare HMO | Admitting: Sports Medicine

## 2013-05-13 ENCOUNTER — Ambulatory Visit (HOSPITAL_BASED_OUTPATIENT_CLINIC_OR_DEPARTMENT_OTHER): Payer: Medicare HMO

## 2013-05-13 ENCOUNTER — Other Ambulatory Visit: Payer: Self-pay

## 2013-05-13 VITALS — BP 133/74 | HR 78 | Ht 64.0 in | Wt 187.0 lb

## 2013-05-13 VITALS — BP 143/65 | HR 69 | Temp 98.3°F

## 2013-05-13 DIAGNOSIS — IMO0002 Reserved for concepts with insufficient information to code with codable children: Secondary | ICD-10-CM

## 2013-05-13 DIAGNOSIS — M1711 Unilateral primary osteoarthritis, right knee: Secondary | ICD-10-CM

## 2013-05-13 DIAGNOSIS — Z95828 Presence of other vascular implants and grafts: Secondary | ICD-10-CM

## 2013-05-13 DIAGNOSIS — C50419 Malignant neoplasm of upper-outer quadrant of unspecified female breast: Secondary | ICD-10-CM

## 2013-05-13 DIAGNOSIS — Z452 Encounter for adjustment and management of vascular access device: Secondary | ICD-10-CM

## 2013-05-13 DIAGNOSIS — M1710 Unilateral primary osteoarthritis, unspecified knee: Secondary | ICD-10-CM

## 2013-05-13 MED ORDER — SODIUM CHLORIDE 0.9 % IJ SOLN
10.0000 mL | INTRAMUSCULAR | Status: DC | PRN
  Administered 2013-05-13: 10 mL via INTRAVENOUS
  Filled 2013-05-13: qty 10

## 2013-05-13 MED ORDER — HEPARIN SOD (PORK) LOCK FLUSH 100 UNIT/ML IV SOLN
500.0000 [IU] | Freq: Once | INTRAVENOUS | Status: AC
  Administered 2013-05-13: 500 [IU] via INTRAVENOUS
  Filled 2013-05-13: qty 5

## 2013-05-13 NOTE — Patient Instructions (Signed)

## 2013-05-13 NOTE — Assessment & Plan Note (Signed)
Injection #3 of 5 today. Return in one week for #4.

## 2013-05-13 NOTE — Progress Notes (Signed)
    Procedure: Real-time Ultrasound Guided Injection of right knee Device: GE Logiq E  Verbal informed consent obtained.  Time-out conducted.  Noted no overlying erythema, induration, or other signs of local infection.  Skin prepped in a sterile fashion.  Local anesthesia: Topical Ethyl chloride.  With sterile technique and under real time ultrasound guidance:  25 mg/2.5 mL of Supartz (sodium hyaluronate) in a prefilled syringe was injected easily into the knee through a 22-gauge needle.  Completed without difficulty  Pain immediately resolved suggesting accurate placement of the medication.  Advised to call if fevers/chills, erythema, induration, drainage, or persistent bleeding.  Images permanently stored and available for review in the ultrasound unit.  Impression: Technically successful ultrasound guided injection. 

## 2013-05-20 ENCOUNTER — Other Ambulatory Visit: Payer: Self-pay | Admitting: Internal Medicine

## 2013-05-23 ENCOUNTER — Ambulatory Visit (INDEPENDENT_AMBULATORY_CARE_PROVIDER_SITE_OTHER): Payer: Medicare HMO | Admitting: Sports Medicine

## 2013-05-23 ENCOUNTER — Encounter: Payer: Self-pay | Admitting: Sports Medicine

## 2013-05-23 VITALS — BP 131/77 | HR 69 | Ht 63.0 in | Wt 186.0 lb

## 2013-05-23 DIAGNOSIS — M1710 Unilateral primary osteoarthritis, unspecified knee: Secondary | ICD-10-CM

## 2013-05-23 DIAGNOSIS — IMO0002 Reserved for concepts with insufficient information to code with codable children: Secondary | ICD-10-CM

## 2013-05-23 DIAGNOSIS — M1711 Unilateral primary osteoarthritis, right knee: Secondary | ICD-10-CM

## 2013-05-23 NOTE — Progress Notes (Signed)
     Procedure: Real-time Ultrasound Guided Injection of right knee Device: GE Logiq E  Verbal informed consent obtained.  Time-out conducted.  Noted no overlying erythema, induration, or other signs of local infection.  Skin prepped in a sterile fashion.  Local anesthesia: Topical Ethyl chloride.  With sterile technique and under real time ultrasound guidance:  25 mg/2.5 mL of Supartz (sodium hyaluronate) in a prefilled syringe was injected easily into the knee through a 22-gauge needle.  Completed without difficulty  Pain immediately resolved suggesting accurate placement of the medication.  Advised to call if fevers/chills, erythema, induration, drainage, or persistent bleeding.  Images permanently stored and available for review in the ultrasound unit.  Impression: Technically successful ultrasound guided injection. 

## 2013-05-23 NOTE — Assessment & Plan Note (Addendum)
Pain-free at this point. Injection #4 of 5 today. Return in one week for #5.

## 2013-06-06 ENCOUNTER — Encounter: Payer: Self-pay | Admitting: Gynecology

## 2013-06-06 ENCOUNTER — Ambulatory Visit (INDEPENDENT_AMBULATORY_CARE_PROVIDER_SITE_OTHER): Payer: Medicare HMO | Admitting: Gynecology

## 2013-06-06 VITALS — BP 120/76 | Ht 63.0 in | Wt 187.0 lb

## 2013-06-06 DIAGNOSIS — L900 Lichen sclerosus et atrophicus: Secondary | ICD-10-CM

## 2013-06-06 DIAGNOSIS — C50919 Malignant neoplasm of unspecified site of unspecified female breast: Secondary | ICD-10-CM

## 2013-06-06 DIAGNOSIS — N952 Postmenopausal atrophic vaginitis: Secondary | ICD-10-CM

## 2013-06-06 DIAGNOSIS — L940 Localized scleroderma [morphea]: Secondary | ICD-10-CM

## 2013-06-06 MED ORDER — CLOBETASOL PROPIONATE 0.05 % EX CREA: TOPICAL_CREAM | CUTANEOUS | Status: DC

## 2013-06-06 NOTE — Patient Instructions (Signed)
Applied the steroid cream to the outside of the vagina every night for one month. Then taper to every other night for 2 weeks and every third night for 2 weeks and then stop. Followup if your symptoms worsen or do not seem to get better. Followup in one year for annual exam.

## 2013-06-06 NOTE — Progress Notes (Signed)
Alison [name] 1940-08-11 [id number]        73 y.o.  G3P3003 for followup exam.  Several issues noted below.  Past medical history,surgical history, problem list, medications, allergies, family history and social history were all reviewed and documented as reviewed in the EPIC chart.  ROS:  12 system ROS performed with pertinent positives and negatives included in the history, assessment and plan.  Included Systems: General, HEENT, Neck, Cardiovascular, Pulmonary, Gastrointestinal, Genitourinary, Musculoskeletal, Dermatologic, Endocrine, Hematological, Neurologic, Psychiatric Additional significant findings :  None   Exam: Kim assistant Filed Vitals:   06/06/13 1446  BP: 120/76  Height: 5\' 3"  (1.6 m)  Weight: 187 lb (84.823 kg)   General appearance:  Normal affect, orientation and appearance. Skin: Grossly normal HEENT: Without gross lesions.  No cervical or supraclavicular adenopathy. Thyroid normal.  Lungs:  Clear without wheezing, rales or rhonchi Cardiac: RR, without RMG Abdominal:  Soft, nontender, without masses, guarding, rebound, organomegaly or hernia Breasts:  Examined lying and sitting without masses, retractions, discharge or axillary adenopathy. Pelvic:  Ext/BUS/vagina with generalized atrophic changes. Vitiligo changes from the periclitoral hood to the perianal region. Consistent with her history of lichen sclerosus.  Cervix with atrophic changes  Uterus grossly normal size, gross nontender   Adnexa  Without masses or tenderness    Anus and perineum  Normal   Rectovaginal  Normal sphincter tone without palpated masses or tenderness.    Assessment/Plan:  73 y.o. G3P3003 female.  1. Lichen sclerosus. Patient still having irritation but notices she is not using the cream consistently. Recommend nightly use for one month then taper over the following month. Use more aggressively but intermittently. Avoidance of daily use for long periods of time reviewed. Followup if  symptoms do not seem to get better or worsen. Reviewed possible addition of vaginal estrogen if symptoms continue. In a patient with a history of breast cancer x2 the issues of absorption and stimulation of recurrent disease discussed. At this point we'll not pursue any free do that I will have her get her oncologists approval before considering.  2. Breast cancer. Exam NED. Continue with annual mammography is and followup with her oncologists. Still continues on arimidex. 3. Pap smear 2011. No Pap smear done today. History of cryosurgery a number of years ago with normal Pap smears since then. Patient is comfortable with stop sreening at this point. 4. DEXA 2013 normal through her oncology office. Continue followup with them as far as recommended repeat interval. Increase calcium vitamin D reviewed. 5. Colonoscopy 2014. Repeat at their recommended interval. 6. Health maintenance. No blood work done as this is done through her other physician's offices. Followup one year, sooner as needed.  Note: This document was prepared with digital dictation and possible smart phrase technology. Any transcriptional errors that result from this process are unintentional.   Anastasio Auerbach MD, 3:54 PM 06/06/2013

## 2013-06-13 DIAGNOSIS — F329 Major depressive disorder, single episode, unspecified: Secondary | ICD-10-CM | POA: Insufficient documentation

## 2013-06-13 DIAGNOSIS — E785 Hyperlipidemia, unspecified: Secondary | ICD-10-CM | POA: Insufficient documentation

## 2013-06-13 DIAGNOSIS — I10 Essential (primary) hypertension: Secondary | ICD-10-CM | POA: Insufficient documentation

## 2013-06-13 DIAGNOSIS — F320 Major depressive disorder, single episode, mild: Secondary | ICD-10-CM | POA: Insufficient documentation

## 2013-06-25 ENCOUNTER — Ambulatory Visit (HOSPITAL_BASED_OUTPATIENT_CLINIC_OR_DEPARTMENT_OTHER): Payer: Medicare HMO

## 2013-06-25 VITALS — BP 141/67 | HR 79 | Temp 98.8°F

## 2013-06-25 DIAGNOSIS — Z95828 Presence of other vascular implants and grafts: Secondary | ICD-10-CM

## 2013-06-25 DIAGNOSIS — C50419 Malignant neoplasm of upper-outer quadrant of unspecified female breast: Secondary | ICD-10-CM

## 2013-06-25 DIAGNOSIS — Z452 Encounter for adjustment and management of vascular access device: Secondary | ICD-10-CM

## 2013-06-25 MED ORDER — SODIUM CHLORIDE 0.9 % IJ SOLN
10.0000 mL | INTRAMUSCULAR | Status: DC | PRN
  Administered 2013-06-25: 10 mL via INTRAVENOUS
  Filled 2013-06-25: qty 10

## 2013-06-25 MED ORDER — HEPARIN SOD (PORK) LOCK FLUSH 100 UNIT/ML IV SOLN
500.0000 [IU] | Freq: Once | INTRAVENOUS | Status: AC
  Administered 2013-06-25: 500 [IU] via INTRAVENOUS
  Filled 2013-06-25: qty 5

## 2013-07-30 ENCOUNTER — Telehealth: Payer: Self-pay | Admitting: Internal Medicine

## 2013-07-30 NOTE — Telephone Encounter (Signed)
Pt called and r/s lab and MD to 7/23 from 7/10

## 2013-08-02 ENCOUNTER — Ambulatory Visit: Payer: Self-pay

## 2013-08-02 ENCOUNTER — Other Ambulatory Visit: Payer: Self-pay

## 2013-08-13 ENCOUNTER — Other Ambulatory Visit: Payer: Self-pay | Admitting: Internal Medicine

## 2013-08-15 ENCOUNTER — Telehealth: Payer: Self-pay | Admitting: Internal Medicine

## 2013-08-15 ENCOUNTER — Encounter: Payer: Self-pay | Admitting: Internal Medicine

## 2013-08-15 ENCOUNTER — Ambulatory Visit (HOSPITAL_BASED_OUTPATIENT_CLINIC_OR_DEPARTMENT_OTHER): Payer: Medicare HMO | Admitting: Internal Medicine

## 2013-08-15 ENCOUNTER — Other Ambulatory Visit (HOSPITAL_BASED_OUTPATIENT_CLINIC_OR_DEPARTMENT_OTHER): Payer: Medicare HMO

## 2013-08-15 ENCOUNTER — Ambulatory Visit: Payer: Medicare HMO

## 2013-08-15 VITALS — BP 140/70 | HR 65 | Temp 98.1°F | Resp 18 | Ht 63.0 in | Wt 190.0 lb

## 2013-08-15 DIAGNOSIS — Z853 Personal history of malignant neoplasm of breast: Secondary | ICD-10-CM

## 2013-08-15 DIAGNOSIS — Z95828 Presence of other vascular implants and grafts: Secondary | ICD-10-CM

## 2013-08-15 DIAGNOSIS — R209 Unspecified disturbances of skin sensation: Secondary | ICD-10-CM

## 2013-08-15 DIAGNOSIS — C50919 Malignant neoplasm of unspecified site of unspecified female breast: Secondary | ICD-10-CM

## 2013-08-15 DIAGNOSIS — C50419 Malignant neoplasm of upper-outer quadrant of unspecified female breast: Secondary | ICD-10-CM

## 2013-08-15 DIAGNOSIS — Z170 Estrogen receptor positive status [ER+]: Secondary | ICD-10-CM

## 2013-08-15 LAB — BASIC METABOLIC PANEL (CC13)
ANION GAP: 8 meq/L (ref 3–11)
BUN: 16.3 mg/dL (ref 7.0–26.0)
CALCIUM: 9.4 mg/dL (ref 8.4–10.4)
CO2: 28 mEq/L (ref 22–29)
Chloride: 105 mEq/L (ref 98–109)
Creatinine: 0.8 mg/dL (ref 0.6–1.1)
Glucose: 95 mg/dl (ref 70–140)
POTASSIUM: 3.6 meq/L (ref 3.5–5.1)
SODIUM: 141 meq/L (ref 136–145)

## 2013-08-15 LAB — CBC WITH DIFFERENTIAL/PLATELET
BASO%: 0.7 % (ref 0.0–2.0)
Basophils Absolute: 0 10*3/uL (ref 0.0–0.1)
EOS%: 1.3 % (ref 0.0–7.0)
Eosinophils Absolute: 0.1 10*3/uL (ref 0.0–0.5)
HCT: 37.2 % (ref 34.8–46.6)
HGB: 12.2 g/dL (ref 11.6–15.9)
LYMPH%: 19.3 % (ref 14.0–49.7)
MCH: 32 pg (ref 25.1–34.0)
MCHC: 32.8 g/dL (ref 31.5–36.0)
MCV: 97.5 fL (ref 79.5–101.0)
MONO#: 0.5 10*3/uL (ref 0.1–0.9)
MONO%: 11.2 % (ref 0.0–14.0)
NEUT%: 67.5 % (ref 38.4–76.8)
NEUTROS ABS: 3.2 10*3/uL (ref 1.5–6.5)
Platelets: 233 10*3/uL (ref 145–400)
RBC: 3.81 10*6/uL (ref 3.70–5.45)
RDW: 13.5 % (ref 11.2–14.5)
WBC: 4.7 10*3/uL (ref 3.9–10.3)
lymph#: 0.9 10*3/uL (ref 0.9–3.3)

## 2013-08-15 LAB — LACTATE DEHYDROGENASE (CC13): LDH: 248 U/L — AB (ref 125–245)

## 2013-08-15 MED ORDER — HEPARIN SOD (PORK) LOCK FLUSH 100 UNIT/ML IV SOLN
500.0000 [IU] | Freq: Once | INTRAVENOUS | Status: AC
  Administered 2013-08-15: 500 [IU] via INTRAVENOUS
  Filled 2013-08-15: qty 5

## 2013-08-15 MED ORDER — SODIUM CHLORIDE 0.9 % IJ SOLN
10.0000 mL | INTRAMUSCULAR | Status: DC | PRN
  Administered 2013-08-15: 10 mL via INTRAVENOUS
  Filled 2013-08-15: qty 10

## 2013-08-15 NOTE — Progress Notes (Signed)
[name], [place] [address]  DIAGNOSIS: Breast cancer, unspecified laterality - Plan: CBC with Differential, Basic metabolic panel (Bmet) - [place]  Chief Complaint  Patient presents with  . Breast Cancer    CURRENT THERAPY: Arimidex 1 mg daily since November 2010.   INTERVAL HISTORY: Alison [name] 73 y.o. female  history of bilateral breast carcinoma, continuing aromatase inhibitor(arimidex) is here for follow-up. She was last seen by me on 04/02/2013.  She still has PAC in, which is kept flushed q 8 weeks. Still has some occasional numbness in the right side (chronic); problems with knees bilaterally secondary to arthritis; and she had an episode of shingles 2 years ago on left flank with occasional pruritus.  She has been on arimidex for the past four years.   She has had no infectious illness, no new or different pain, appetite and energy good, no SOB, some pain left hand improved with mobic for 10 days and a brace for a month after I saw her last. She had a colonoscopy by Dr. [name] this past March.   MEDICAL HISTORY: Past Medical History  Diagnosis Date  . Breast cancer     X2  . Lichen sclerosus   . Glaucoma   . Hypertension   . Hyperlipidemia   . Shingles 2012  . Arthritis   . Cervical dysplasia     INTERIM HISTORY: has Shingles; Breast cancer; Lichen sclerosus; Tendinitis, de Quervain's; and Osteoarthritis of right knee on her problem list.    ALLERGIES:  has No Known Allergies.  MEDICATIONS: has a current medication list which includes the following prescription(s): anastrozole, aspirin, biotin, calcium carbonate-vitamin d, clobetasol cream, escitalopram, flaxseed (linseed), losartan-hydrochlorothiazide, omega-3 fatty acids, rosuvastatin, and travoprost (benzalkonium), and the following Facility-Administered Medications: sodium chloride.  SURGICAL HISTORY:  Past Surgical History  Procedure  Laterality Date  . Hysteroscopy    . Tubal ligation    . Breast surgery      Lumpectomy right and left  . Gynecologic cryosurgery     ONCOLOGY HISTORY: Patient most recently had T1bN2 (6 of 23 nodes) grade 1 invasive ductal carcinoma of right breast ER/PR + and HER2 - diagnosed spring 2010. She had adjuvant taxotere/cytoxan, local radiation and continues aromatase inhibtor which was begun in Nov.2010. Past history is significant for T2 left breast cancer with micrometastatic involvement of sentinel node in 1998, treated with adjuvant adria/cytoxan/taxol, radiation and 2 1/2 years of tamoxifen. Last bone density scan was at [facility] 02-24-11, still in normal range but down from 2010.Marland Kitchen Last bilateral mammograms were at [facility] 04-07-11 and last breast MRI June 25, 2010. She still has PAC in place which is flushed q 6-8 weeks; note peripheral venous access is limited as she has had bilateral axillary dissections and radiation.   REVIEW OF SYSTEMS:   Constitutional: Denies fevers, chills or abnormal weight loss Eyes: Denies blurriness of vision Ears, nose, mouth, throat, and face: Denies mucositis or sore throat Respiratory: Denies cough, dyspnea or wheezes Cardiovascular: Denies palpitation, chest discomfort or lower extremity swelling Gastrointestinal:  Denies nausea, heartburn or change in bowel habits Skin: Denies abnormal skin rashes Lymphatics: Denies new lymphadenopathy or easy bruising Neurological:Denies numbness, tingling or new weaknesses Behavioral/Psych: Mood is stable, no new changes  All other systems were reviewed with the patient and are negative.  PHYSICAL EXAMINATION: ECOG PERFORMANCE STATUS: 0 - Asymptomatic  Blood pressure 140/70, pulse 65, temperature 98.1 F (36.7 C),  temperature source Oral, resp. rate 18, height _0  (1.6 m), weight 190 lb (86.183 kg), last menstrual period 01/25/2004, SpO2 98.00%.  GENERAL:alert, no distress and comfortable SKIN: skin  color, texture, turgor are normal, no rashes or significant lesions; L Portacath-without erythema or tenderness, flushed today EYES: normal, Conjunctiva are pink and non-injected, sclera clear OROPHARYNX:no exudate, no erythema and lips, buccal mucosa, and tongue normal  NECK: supple, thyroid normal size, non-tender, without nodularity LYMPH:  no palpable lymphadenopathy in the cervical, axillary or supraclavicular LUNGS: clear to auscultation and percussion with normal breathing effort HEART: regular rate & rhythm and no murmurs and no lower extremity edema ABDOMEN:abdomen soft, non-tender and normal bowel sounds Musculoskeletal:no cyanosis of digits and no clubbing  BREASTS: bilateral lumpectomy scars well healed, no dominant mass, no skin or nipple findings of concern, axillae and UE not remarkable.  NEURO: alert & oriented x 3 with fluent speech, no focal motor/sensory deficits  Labs:  Lab Results  Component Value Date   WBC 4.7 08/15/2013   HGB 12.2 08/15/2013   HCT 37.2 08/15/2013   MCV 97.5 08/15/2013   PLT 233 08/15/2013   NEUTROABS 3.2 08/15/2013      Chemistry      Component Value Date/Time   NA 141 08/15/2013 0849   NA 145 09/12/2011 1132   K 3.6 08/15/2013 0849   K 3.5 09/12/2011 1132   CL 103 02/27/2012 1119   CL 104 09/12/2011 1132   CO2 28 08/15/2013 0849   CO2 26 09/12/2011 1132   BUN 16.3 08/15/2013 0849   BUN 21 09/12/2011 1132   CREATININE 0.8 08/15/2013 0849   CREATININE 0.85 09/12/2011 1132      Component Value Date/Time   CALCIUM 9.4 08/15/2013 0849   CALCIUM 9.5 09/12/2011 1132   ALKPHOS 80 04/02/2013 0940   ALKPHOS 72 09/12/2011 1132   AST 23 04/02/2013 0940   AST 23 09/12/2011 1132   ALT 21 04/02/2013 0940   ALT 20 09/12/2011 1132   BILITOT 0.90 04/02/2013 0940   BILITOT 1.3* 09/12/2011 1132     RADIOGRAPHIC STUDIES:04/08/2013 DIGITAL DIAGNOSTIC BILATERAL MAMMOGRAM WITH CAD COMPARISON: With priors. ACR Breast Density Category c: The breast tissue is heterogeneously  dense, which may obscure small masses. FINDINGS: No suspicious mass or malignant type microcalcifications seen in either breast. Stable lumpectomy changes are seen bilaterally.  Mammographic images were processed with CAD. IMPRESSION: No evidence of malignancy in either breast.  RECOMMENDATION: Bilateral diagnostic mammogram in 1 year is recommended. BI-RADS CATEGORY 2: Benign finding(s).  ASSESSMENT: Alison [name] 73 y.o. female with a history of Breast cancer, unspecified laterality - Plan: CBC with Differential, Basic metabolic panel (Bmet) - [facility]   PLAN:  1.Bilateral breast cancer, node postitve, history as above and continuing Arimidex. I will see her back in 3 months, or sooner if needed. Her last mammogram as noted above was without evidence of malignancy in either breast.   2.PAC in: flush q 8 wks.  3.Degenerative arthritis  4.Bone density normal in Feb 2013 tho down somewhat from 2010. We will repeat scan next visit. Continue calcium carbonate-vitamin D daily.   All questions were answered. The patient knows to call the clinic with any problems, questions or concerns. We can certainly see the patient much sooner if necessary.  I spent 10 minutes counseling the patient face to face. The total time spent in the appointment was 15 minutes.    Antario Yasuda, MD 08/15/2013 11:58 AM

## 2013-08-15 NOTE — Telephone Encounter (Signed)
Pt confirmed labs/ov per 07/23 POF, gave pt AVS.....KJ °

## 2013-10-16 ENCOUNTER — Ambulatory Visit (HOSPITAL_BASED_OUTPATIENT_CLINIC_OR_DEPARTMENT_OTHER): Payer: Medicare HMO

## 2013-10-16 VITALS — BP 138/75 | HR 68 | Temp 97.9°F

## 2013-10-16 DIAGNOSIS — Z452 Encounter for adjustment and management of vascular access device: Secondary | ICD-10-CM

## 2013-10-16 DIAGNOSIS — C50419 Malignant neoplasm of upper-outer quadrant of unspecified female breast: Secondary | ICD-10-CM

## 2013-10-16 DIAGNOSIS — Z95828 Presence of other vascular implants and grafts: Secondary | ICD-10-CM

## 2013-10-16 MED ORDER — HEPARIN SOD (PORK) LOCK FLUSH 100 UNIT/ML IV SOLN
500.0000 [IU] | Freq: Once | INTRAVENOUS | Status: AC
  Administered 2013-10-16: 500 [IU] via INTRAVENOUS
  Filled 2013-10-16: qty 5

## 2013-10-16 MED ORDER — SODIUM CHLORIDE 0.9 % IJ SOLN
10.0000 mL | INTRAMUSCULAR | Status: DC | PRN
  Administered 2013-10-16: 10 mL via INTRAVENOUS
  Filled 2013-10-16: qty 10

## 2013-10-16 NOTE — Patient Instructions (Signed)

## 2013-11-14 ENCOUNTER — Other Ambulatory Visit: Payer: Self-pay | Admitting: Internal Medicine

## 2013-11-22 ENCOUNTER — Other Ambulatory Visit: Payer: Self-pay

## 2013-11-22 DIAGNOSIS — C50919 Malignant neoplasm of unspecified site of unspecified female breast: Secondary | ICD-10-CM

## 2013-11-22 MED ORDER — ANASTROZOLE 1 MG PO TABS: 1.0000 mg | ORAL_TABLET | Freq: Every day | ORAL | Status: DC

## 2013-11-22 NOTE — Telephone Encounter (Signed)
S/w pt and she will fill Rx this month and talk with MD at appt 12/16/13 about continuing arimidex.

## 2013-11-25 ENCOUNTER — Encounter: Payer: Self-pay | Admitting: Internal Medicine

## 2013-12-11 ENCOUNTER — Telehealth: Payer: Self-pay | Admitting: Hematology

## 2013-12-11 NOTE — Telephone Encounter (Signed)
Pt called and r/s appt for 12/27/13 and confirmed appt d/t.

## 2013-12-16 ENCOUNTER — Ambulatory Visit: Payer: Medicare HMO

## 2013-12-16 ENCOUNTER — Other Ambulatory Visit: Payer: Medicare HMO

## 2013-12-26 ENCOUNTER — Other Ambulatory Visit: Payer: Self-pay | Admitting: *Deleted

## 2013-12-26 DIAGNOSIS — C50919 Malignant neoplasm of unspecified site of unspecified female breast: Secondary | ICD-10-CM

## 2013-12-27 ENCOUNTER — Other Ambulatory Visit (HOSPITAL_BASED_OUTPATIENT_CLINIC_OR_DEPARTMENT_OTHER): Payer: Medicare HMO

## 2013-12-27 ENCOUNTER — Ambulatory Visit (HOSPITAL_BASED_OUTPATIENT_CLINIC_OR_DEPARTMENT_OTHER): Payer: Medicare HMO

## 2013-12-27 ENCOUNTER — Ambulatory Visit (HOSPITAL_BASED_OUTPATIENT_CLINIC_OR_DEPARTMENT_OTHER): Payer: Medicare HMO | Admitting: Hematology

## 2013-12-27 ENCOUNTER — Telehealth: Payer: Self-pay | Admitting: Hematology

## 2013-12-27 ENCOUNTER — Telehealth: Payer: Self-pay | Admitting: *Deleted

## 2013-12-27 VITALS — BP 148/69 | HR 70 | Temp 98.0°F | Resp 18 | Ht 63.0 in | Wt 190.2 lb

## 2013-12-27 DIAGNOSIS — C50919 Malignant neoplasm of unspecified site of unspecified female breast: Secondary | ICD-10-CM

## 2013-12-27 DIAGNOSIS — Z95828 Presence of other vascular implants and grafts: Secondary | ICD-10-CM

## 2013-12-27 LAB — BASIC METABOLIC PANEL (CC13)
ANION GAP: 10 meq/L (ref 3–11)
BUN: 15.4 mg/dL (ref 7.0–26.0)
CALCIUM: 10.1 mg/dL (ref 8.4–10.4)
CO2: 28 meq/L (ref 22–29)
CREATININE: 0.8 mg/dL (ref 0.6–1.1)
Chloride: 104 mEq/L (ref 98–109)
Glucose: 80 mg/dl (ref 70–140)
Potassium: 3.7 mEq/L (ref 3.5–5.1)
Sodium: 141 mEq/L (ref 136–145)

## 2013-12-27 LAB — CBC WITH DIFFERENTIAL/PLATELET
BASO%: 0.4 % (ref 0.0–2.0)
Basophils Absolute: 0 10*3/uL (ref 0.0–0.1)
EOS%: 0.9 % (ref 0.0–7.0)
Eosinophils Absolute: 0.1 10*3/uL (ref 0.0–0.5)
HCT: 36.7 % (ref 34.8–46.6)
HEMOGLOBIN: 12.5 g/dL (ref 11.6–15.9)
LYMPH%: 19.5 % (ref 14.0–49.7)
MCH: 32.5 pg (ref 25.1–34.0)
MCHC: 34.1 g/dL (ref 31.5–36.0)
MCV: 95.3 fL (ref 79.5–101.0)
MONO#: 0.5 10*3/uL (ref 0.1–0.9)
MONO%: 9.1 % (ref 0.0–14.0)
NEUT%: 70.1 % (ref 38.4–76.8)
NEUTROS ABS: 3.7 10*3/uL (ref 1.5–6.5)
NRBC: 0 % (ref 0–0)
Platelets: 260 10*3/uL (ref 145–400)
RBC: 3.85 10*6/uL (ref 3.70–5.45)
RDW: 13.3 % (ref 11.2–14.5)
WBC: 5.3 10*3/uL (ref 3.9–10.3)
lymph#: 1 10*3/uL (ref 0.9–3.3)

## 2013-12-27 MED ORDER — ANASTROZOLE 1 MG PO TABS: 1.0000 mg | ORAL_TABLET | Freq: Every day | ORAL | Status: DC

## 2013-12-27 MED ORDER — SODIUM CHLORIDE 0.9 % IJ SOLN
10.0000 mL | INTRAMUSCULAR | Status: DC | PRN
  Administered 2013-12-27: 10 mL via INTRAVENOUS
  Filled 2013-12-27: qty 10

## 2013-12-27 MED ORDER — HEPARIN SOD (PORK) LOCK FLUSH 100 UNIT/ML IV SOLN
500.0000 [IU] | Freq: Once | INTRAVENOUS | Status: AC
  Administered 2013-12-27: 500 [IU] via INTRAVENOUS
  Filled 2013-12-27: qty 5

## 2013-12-27 NOTE — Progress Notes (Signed)
[employer], MD [address] [place] [address]  DIAGNOSIS: Malignant neoplasm of breast (female), Bilateral    ONCOLOGY HISTORY  1. 1998 left T2 with micrometastatic involvement of sentinel node, treated with lumpectomy and axillary node evaluation, chemo/RT/tamoxifen (only for a few years).   2. Spring 2010 right T1bN2 (6 of 23 nodes involved) invasive ductal ER/PR + and HER 2 - treated with  lumpectomy + axillary nodes, adjuvant adria/cytoxan/taxol, radiation and aromatase inhibitor (on now).  CURRENT THERAPY: Arimidex 1 mg daily since November 2010.   INTERVAL HISTORY: Alison [name] 73 y.o. female  history of bilateral breast carcinoma, continuing aromatase inhibitor(arimidex) is here for follow-up. She was last seen by Dr. Juliann Mule 6 months ago, who has left the practice. She is doing well well overall, no pain, dyspnea or any other complains. She feels little stressful lately due to the coming holiday.    MEDICAL HISTORY: Past Medical History  Diagnosis Date  . Breast cancer     X2  . Lichen sclerosus   . Glaucoma   . Hypertension   . Hyperlipidemia   . Shingles 2012  . Arthritis   . Cervical dysplasia       ALLERGIES:  has No Known Allergies.  MEDICATIONS: has a current medication list which includes the following prescription(s): anastrozole, aspirin, biotin, calcium carbonate-vitamin d, clobetasol cream, escitalopram, flaxseed (linseed), losartan-hydrochlorothiazide, omega-3 fatty acids, rosuvastatin, and travoprost (benzalkonium), and the following Facility-Administered Medications: sodium chloride.  SURGICAL HISTORY:  Past Surgical History  Procedure Laterality Date  . Hysteroscopy    . Tubal ligation    . Breast surgery      Lumpectomy right and left  . Gynecologic cryosurgery      REVIEW OF SYSTEMS:   Constitutional: Denies fevers, chills or abnormal weight loss            Eyes: Denies  blurriness of vision Ears, nose, mouth, throat, and face: Denies mucositis or sore throat Respiratory: Denies cough, dyspnea or wheezes Cardiovascular: Denies palpitation, chest discomfort or lower extremity swelling Gastrointestinal:  Denies nausea, heartburn or change in bowel habits Skin: Denies abnormal skin rashes Lymphatics: Denies new lymphadenopathy or easy bruising Neurological:Denies numbness, tingling or new weaknesses Behavioral/Psych: Mood is stable, no new changes  All other systems were reviewed with the patient and are negative.  PHYSICAL EXAMINATION: ECOG PERFORMANCE STATUS: 0 - Asymptomatic  Blood pressure 148/69, pulse 70, temperature 98 F (36.7 C), temperature source Oral, resp. rate 18, height 5\' 3"  (1.6 m), weight 190 lb 3.2 oz (86.274 kg), last menstrual period 01/25/2004, SpO2 100 %.  GENERAL:alert, no distress and comfortable SKIN: skin color, texture, turgor are normal, no rashes or significant lesions; L Portacath-without erythema or tenderness, flushed today EYES: normal, Conjunctiva are pink and non-injected, sclera clear OROPHARYNX:no exudate, no erythema and lips, buccal mucosa, and tongue normal  NECK: supple, thyroid normal size, non-tender, without nodularity LYMPH:  no palpable lymphadenopathy in the cervical, axillary or supraclavicular LUNGS: clear to auscultation and percussion with normal breathing effort HEART: regular rate & rhythm and no murmurs and no lower extremity edema ABDOMEN:abdomen soft, non-tender and normal bowel sounds Musculoskeletal:no cyanosis of digits and no clubbing  BREASTS: bilateral lumpectomy scars well healed, no dominant mass, no skin or nipple findings of concern, axillae and UE not remarkable.  NEURO: alert & oriented x 3 with fluent speech, no focal motor/sensory deficits  Labs:  CBC Latest Ref Rng 12/27/2013 08/15/2013 04/02/2013  WBC 3.9 - 10.3 10e3/uL 5.3 4.7 4.5  Hemoglobin 11.6 - 15.9 g/dL 12.5 12.2 12.1   Hematocrit 34.8 - 46.6 % 36.7 37.2 35.9  Platelets 145 - 400 10e3/uL 260 233 217    CMP Latest Ref Rng 12/27/2013 08/15/2013 04/02/2013  Glucose 70 - 140 mg/dl 80 95 81  BUN 7.0 - 26.0 mg/dL 15.4 16.3 16.4  Creatinine 0.6 - 1.1 mg/dL 0.8 0.8 0.8  Sodium 136 - 145 mEq/L 141 141 142  Potassium 3.5 - 5.1 mEq/L 3.7 3.6 3.6  Chloride 98 - 107 mEq/L - - -  CO2 22 - 29 mEq/L 28 28 27   Calcium 8.4 - 10.4 mg/dL 10.1 9.4 9.4  Total Protein 6.4 - 8.3 g/dL - - 7.2  Total Bilirubin 0.20 - 1.20 mg/dL - - 0.90  Alkaline Phos 40 - 150 U/L - - 80  AST 5 - 34 U/L - - 23  ALT 0 - 55 U/L - - 21      RADIOGRAPHIC STUDIES:04/08/2013 DIGITAL DIAGNOSTIC BILATERAL MAMMOGRAM WITH CAD COMPARISON: With priors. ACR Breast Density Category c: The breast tissue is heterogeneously dense, which may obscure small masses. FINDINGS: No suspicious mass or malignant type microcalcifications seen in either breast. Stable lumpectomy changes are seen bilaterally.  Mammographic images were processed with CAD. IMPRESSION: No evidence of malignancy in either breast.  RECOMMENDATION: Bilateral diagnostic mammogram in 1 year is recommended. BI-RADS CATEGORY 2: Benign finding(s).  ASSESSMENT: Alison [name] 73 y.o. female with a history of Malignant neoplasm of breast (female), currently on adjuvant hormonal therapy.   PLAN:  -she has been on adjuvant anastrozole for 5 years now. I discussed stopping versus continuing anastrozole. We have clinical data showing 10 years of tamoxifen is better than 5 years he adjuvant setting, however we do not have adequate data about adjuvant AI beyond 5 years. The standard duration for adjuvant AI is still 5 years. -Given her history of multiple breast cancer, and the strong ER/PR positivity, and her excellent tolerance to anastrozole, I think it is reasonable to continue beyond 5 years. She also prefers to continue. -Refill prescription was given to her. -Next mammogram is due in March 2016. She  is also due for bone density scan. Her last one was in January 2013. -I suggest her to remove his Mediport, she agrees.  I'll contact her breast surgeon Dr. Marlou Starks for port removal  Follow-up: One year with labs CBC and CMP  All questions were answered. The patient knows to call the clinic with any problems, questions or concerns. We can certainly see the patient much sooner if necessary.  I spent 10 minutes counseling the patient face to face. The total time spent in the appointment was 15 minutes.    Truitt Merle, MD 12/27/2013 10:02 AM

## 2013-12-27 NOTE — Telephone Encounter (Signed)
gv adn printed appt sched and avs for pt for Dec 2016

## 2013-12-27 NOTE — Patient Instructions (Signed)

## 2013-12-27 NOTE — Telephone Encounter (Signed)
Called & spoke with Alison [name]/[facility] & requested Dr. Marlou Starks to remove port.  Appt given for 01/13/14 @ 1:30 to see Dr Marlou Starks & to be there 30" early.  Message left on pt's vm at home.

## 2013-12-28 ENCOUNTER — Encounter: Payer: Self-pay | Admitting: Hematology

## 2014-01-13 ENCOUNTER — Other Ambulatory Visit (INDEPENDENT_AMBULATORY_CARE_PROVIDER_SITE_OTHER): Payer: Self-pay | Admitting: General Surgery

## 2014-01-29 ENCOUNTER — Encounter (HOSPITAL_COMMUNITY): Payer: Self-pay

## 2014-01-29 ENCOUNTER — Encounter (HOSPITAL_COMMUNITY)
Admission: RE | Admit: 2014-01-29 | Discharge: 2014-01-29 | Disposition: A | Discharge status: Home / Self Care | Payer: Medicare HMO | Source: Ambulatory Visit | Attending: General Surgery | Admitting: General Surgery

## 2014-01-29 DIAGNOSIS — M179 Osteoarthritis of knee, unspecified: Secondary | ICD-10-CM | POA: Diagnosis not present

## 2014-01-29 DIAGNOSIS — Z853 Personal history of malignant neoplasm of breast: Secondary | ICD-10-CM | POA: Diagnosis present

## 2014-01-29 DIAGNOSIS — K219 Gastro-esophageal reflux disease without esophagitis: Secondary | ICD-10-CM | POA: Diagnosis not present

## 2014-01-29 DIAGNOSIS — I10 Essential (primary) hypertension: Secondary | ICD-10-CM | POA: Diagnosis not present

## 2014-01-29 DIAGNOSIS — F419 Anxiety disorder, unspecified: Secondary | ICD-10-CM | POA: Diagnosis not present

## 2014-01-29 HISTORY — DX: Anxiety disorder, unspecified: F41.9

## 2014-01-29 HISTORY — DX: Gastro-esophageal reflux disease without esophagitis: K21.9

## 2014-01-29 LAB — CBC
HEMATOCRIT: 38.1 % (ref 36.0–46.0)
Hemoglobin: 12.5 g/dL (ref 12.0–15.0)
MCH: 31 pg (ref 26.0–34.0)
MCHC: 32.8 g/dL (ref 30.0–36.0)
MCV: 94.5 fL (ref 78.0–100.0)
Platelets: 244 10*3/uL (ref 150–400)
RBC: 4.03 MIL/uL (ref 3.87–5.11)
RDW: 13.1 % (ref 11.5–15.5)
WBC: 4.8 10*3/uL (ref 4.0–10.5)

## 2014-01-29 LAB — BASIC METABOLIC PANEL
Anion gap: 10 (ref 5–15)
BUN: 14 mg/dL (ref 6–23)
CO2: 30 mmol/L (ref 19–32)
CREATININE: 0.77 mg/dL (ref 0.50–1.10)
Calcium: 9.8 mg/dL (ref 8.4–10.5)
Chloride: 101 mEq/L (ref 96–112)
GFR calc Af Amer: >90 mL/min (ref >90)
GFR calc non Af Amer: 82 mL/min — ABNORMAL LOW (ref >90)
GLUCOSE: 100 mg/dL — AB (ref 70–99)
Potassium: 3.4 mmol/L — ABNORMAL LOW (ref 3.5–5.1)
Sodium: 141 mmol/L (ref 135–145)

## 2014-01-29 NOTE — Progress Notes (Signed)
Patient has stated that 'long time ago--" greater then 5-7 yrs, she had some palpitations.  Went to MD, he said it was d/t her menopause.  No problems now and has not been back since.

## 2014-01-29 NOTE — Pre-Procedure Instructions (Signed)
BREAWNA MONTENEGRO  01/29/2014   Your procedure is scheduled on:  Friday, Jan. 8th   Report to [facility] Admitting at 10:00 AM.   Call this number if you have problems the morning of surgery: [phone]   Remember:   Do not eat food or drink liquids after midnight Thursday.   Take these medicines the morning of surgery with A SIP OF WATER: Lexapro, Omeprazole   Do not wear jewelry, make-up or nail polish.  Do not wear lotions, powders, or perfumes. You may NOT wear deodorant the morning of surgery.  Do not shave 48 hours prior to surgery.   Do not bring valuables to the hospital.  [facility] is not responsible for any belongings or valuables.               Contacts, dentures or bridgework may not be worn into surgery.  Leave suitcase in the car. After surgery it may be brought to your room.               Patients discharged the day of surgery will not be allowed to drive home.   Name and phone number of your driver:    Special Instructions: "Preparing for Surgery" instruction sheet.   Please read over the following fact sheets that you were given: Pain Booklet and Surgical Site Infection Prevention

## 2014-01-30 MED ORDER — CHLORHEXIDINE GLUCONATE 4 % EX LIQD
1.0000 "application " | Freq: Once | CUTANEOUS | Status: DC
  Filled 2014-01-30: qty 15

## 2014-01-31 ENCOUNTER — Encounter (HOSPITAL_COMMUNITY)
Admission: RE | Disposition: A | Discharge status: Home / Self Care | Payer: Self-pay | Source: Ambulatory Visit | Attending: General Surgery

## 2014-01-31 ENCOUNTER — Ambulatory Visit (HOSPITAL_COMMUNITY)
Admission: RE | Admit: 2014-01-31 | Discharge: 2014-01-31 | Disposition: A | Discharge status: Home / Self Care | Payer: Medicare HMO | Source: Ambulatory Visit | Attending: General Surgery | Admitting: General Surgery

## 2014-01-31 ENCOUNTER — Ambulatory Visit (HOSPITAL_COMMUNITY): Payer: Medicare HMO | Admitting: Anesthesiology

## 2014-01-31 ENCOUNTER — Encounter (HOSPITAL_COMMUNITY): Payer: Self-pay | Admitting: *Deleted

## 2014-01-31 DIAGNOSIS — I10 Essential (primary) hypertension: Secondary | ICD-10-CM | POA: Insufficient documentation

## 2014-01-31 DIAGNOSIS — Z853 Personal history of malignant neoplasm of breast: Secondary | ICD-10-CM | POA: Insufficient documentation

## 2014-01-31 DIAGNOSIS — K219 Gastro-esophageal reflux disease without esophagitis: Secondary | ICD-10-CM | POA: Diagnosis not present

## 2014-01-31 DIAGNOSIS — F419 Anxiety disorder, unspecified: Secondary | ICD-10-CM | POA: Insufficient documentation

## 2014-01-31 DIAGNOSIS — M179 Osteoarthritis of knee, unspecified: Secondary | ICD-10-CM | POA: Insufficient documentation

## 2014-01-31 HISTORY — PX: PORT-A-CATH REMOVAL: SHX5289

## 2014-01-31 SURGERY — REMOVAL PORT-A-CATH
Anesthesia: General | Site: Chest | Laterality: Left

## 2014-01-31 MED ORDER — LIDOCAINE HCL (CARDIAC) 20 MG/ML IV SOLN
INTRAVENOUS | Status: AC
  Filled 2014-01-31: qty 5

## 2014-01-31 MED ORDER — LACTATED RINGERS IV SOLN
INTRAVENOUS | Status: DC
Start: 2014-01-31 — End: 2014-01-31
  Administered 2014-01-31: 11:00:00 via INTRAVENOUS

## 2014-01-31 MED ORDER — FENTANYL CITRATE 0.05 MG/ML IJ SOLN
INTRAMUSCULAR | Status: AC
  Filled 2014-01-31: qty 5

## 2014-01-31 MED ORDER — PROPOFOL 10 MG/ML IV BOLUS
INTRAVENOUS | Status: AC
  Filled 2014-01-31: qty 20

## 2014-01-31 MED ORDER — PROPOFOL 10 MG/ML IV BOLUS
INTRAVENOUS | Status: DC | PRN
  Administered 2014-01-31: 160 mg via INTRAVENOUS

## 2014-01-31 MED ORDER — BUPIVACAINE-EPINEPHRINE (PF) 0.25% -1:200000 IJ SOLN
INTRAMUSCULAR | Status: AC
  Filled 2014-01-31: qty 30

## 2014-01-31 MED ORDER — ACETAMINOPHEN 325 MG PO TABS: 325.0000 mg | ORAL_TABLET | ORAL | Status: DC | PRN

## 2014-01-31 MED ORDER — ACETAMINOPHEN 160 MG/5ML PO SOLN: 325.0000 mg | ORAL | Status: DC | PRN

## 2014-01-31 MED ORDER — OXYCODONE HCL 5 MG/5ML PO SOLN: 5.0000 mg | Freq: Once | ORAL | Status: DC | PRN

## 2014-01-31 MED ORDER — LIDOCAINE HCL (CARDIAC) 20 MG/ML IV SOLN
INTRAVENOUS | Status: DC | PRN
  Administered 2014-01-31: 70 mg via INTRAVENOUS

## 2014-01-31 MED ORDER — LIDOCAINE-EPINEPHRINE (PF) 1 %-1:200000 IJ SOLN: INTRAMUSCULAR | Status: DC | PRN

## 2014-01-31 MED ORDER — MIDAZOLAM HCL 5 MG/5ML IJ SOLN
INTRAMUSCULAR | Status: DC | PRN
Start: 2014-01-31 — End: 2014-01-31
  Administered 2014-01-31: 2 mg via INTRAVENOUS

## 2014-01-31 MED ORDER — FENTANYL CITRATE 0.05 MG/ML IJ SOLN: 25.0000 ug | INTRAMUSCULAR | Status: DC | PRN

## 2014-01-31 MED ORDER — CEFAZOLIN SODIUM-DEXTROSE 2-3 GM-% IV SOLR
INTRAVENOUS | Status: AC
  Filled 2014-01-31: qty 50

## 2014-01-31 MED ORDER — 0.9 % SODIUM CHLORIDE (POUR BTL) OPTIME
TOPICAL | Status: DC | PRN
  Administered 2014-01-31: 1000 mL

## 2014-01-31 MED ORDER — BUPIVACAINE-EPINEPHRINE 0.25% -1:200000 IJ SOLN
INTRAMUSCULAR | Status: DC | PRN
  Administered 2014-01-31: 10 mL

## 2014-01-31 MED ORDER — LACTATED RINGERS IV SOLN
INTRAVENOUS | Status: DC | PRN
  Administered 2014-01-31: 13:00:00 via INTRAVENOUS

## 2014-01-31 MED ORDER — OXYCODONE HCL 5 MG PO TABS: 5.0000 mg | ORAL_TABLET | Freq: Once | ORAL | Status: DC | PRN

## 2014-01-31 MED ORDER — MIDAZOLAM HCL 2 MG/2ML IJ SOLN
INTRAMUSCULAR | Status: AC
  Filled 2014-01-31: qty 2

## 2014-01-31 MED ORDER — DEXAMETHASONE SODIUM PHOSPHATE 4 MG/ML IJ SOLN
INTRAMUSCULAR | Status: DC | PRN
  Administered 2014-01-31: 4 mg via INTRAVENOUS

## 2014-01-31 MED ORDER — FENTANYL CITRATE 0.05 MG/ML IJ SOLN
INTRAMUSCULAR | Status: DC | PRN
  Administered 2014-01-31: 50 ug via INTRAVENOUS

## 2014-01-31 MED ORDER — OXYCODONE-ACETAMINOPHEN 5-325 MG PO TABS: 1.0000 | ORAL_TABLET | ORAL | Status: DC | PRN

## 2014-01-31 MED ORDER — PHENYLEPHRINE 40 MCG/ML (10ML) SYRINGE FOR IV PUSH (FOR BLOOD PRESSURE SUPPORT)
PREFILLED_SYRINGE | INTRAVENOUS | Status: AC
  Filled 2014-01-31: qty 20

## 2014-01-31 MED ORDER — CEFAZOLIN SODIUM-DEXTROSE 2-3 GM-% IV SOLR
INTRAVENOUS | Status: AC
  Administered 2014-01-31: 2 g via INTRAVENOUS
  Filled 2014-01-31: qty 50

## 2014-01-31 SURGICAL SUPPLY — 28 items
CHLORAPREP W/TINT 10.5 ML (MISCELLANEOUS) ×3 IMPLANT
COVER SURGICAL LIGHT HANDLE (MISCELLANEOUS) ×3 IMPLANT
DRAPE PED LAPAROTOMY (DRAPES) ×3 IMPLANT
DRAPE UTILITY XL STRL (DRAPES) ×3 IMPLANT
ELECT CAUTERY BLADE 6.4 (BLADE) ×3 IMPLANT
ELECT REM PT RETURN 9FT ADLT (ELECTROSURGICAL) ×3
ELECTRODE REM PT RTRN 9FT ADLT (ELECTROSURGICAL) ×1 IMPLANT
GAUZE SPONGE 4X4 16PLY XRAY LF (GAUZE/BANDAGES/DRESSINGS) ×3 IMPLANT
GLOVE BIO SURGEON STRL SZ7.5 (GLOVE) ×3 IMPLANT
GLOVE BIOGEL PI IND STRL 7.0 (GLOVE) ×1 IMPLANT
GLOVE BIOGEL PI INDICATOR 7.0 (GLOVE) ×2
GLOVE SURG SS PI 7.0 STRL IVOR (GLOVE) ×3 IMPLANT
GOWN STRL REUS W/ TWL LRG LVL3 (GOWN DISPOSABLE) ×2 IMPLANT
GOWN STRL REUS W/TWL LRG LVL3 (GOWN DISPOSABLE) ×4
KIT BASIN OR (CUSTOM PROCEDURE TRAY) ×3 IMPLANT
KIT ROOM TURNOVER OR (KITS) ×3 IMPLANT
LIQUID BAND (GAUZE/BANDAGES/DRESSINGS) ×3 IMPLANT
NEEDLE HYPO 25GX1X1/2 BEV (NEEDLE) ×3 IMPLANT
NS IRRIG 1000ML POUR BTL (IV SOLUTION) ×3 IMPLANT
PACK SURGICAL SETUP 50X90 (CUSTOM PROCEDURE TRAY) ×3 IMPLANT
PAD ARMBOARD 7.5X6 YLW CONV (MISCELLANEOUS) ×6 IMPLANT
PENCIL BUTTON HOLSTER BLD 10FT (ELECTRODE) ×3 IMPLANT
SUT MNCRL AB 4-0 PS2 18 (SUTURE) ×3 IMPLANT
SUT VIC AB 3-0 SH 27 (SUTURE) ×2
SUT VIC AB 3-0 SH 27X BRD (SUTURE) ×1 IMPLANT
SYR CONTROL 10ML LL (SYRINGE) ×3 IMPLANT
TOWEL OR 17X24 6PK STRL BLUE (TOWEL DISPOSABLE) ×3 IMPLANT
TOWEL OR 17X26 10 PK STRL BLUE (TOWEL DISPOSABLE) ×3 IMPLANT

## 2014-01-31 NOTE — Op Note (Signed)
01/31/2014  1:14 PM  PATIENT:  Alison [name]  74 y.o. female  PRE-OPERATIVE DIAGNOSIS:  Hx of breast cancer  POST-OPERATIVE DIAGNOSIS:  Hx of breast cancer  PROCEDURE:  Procedure(s): REMOVAL PORT-A-CATH (Left)  SURGEON:  Surgeon(s) and Role:    * Jovita Kussmaul, MD - Primary  PHYSICIAN ASSISTANT:   ASSISTANTS: none   ANESTHESIA:   general  EBL:     BLOOD ADMINISTERED:none  DRAINS: none   LOCAL MEDICATIONS USED:  MARCAINE     SPECIMEN:  No Specimen  DISPOSITION OF SPECIMEN:  N/A  COUNTS:  YES  TOURNIQUET:  * No tourniquets in log *  DICTATION: .Dragon Dictation  After informed consent was obtained the patient was brought to the operating room and placed in the supine position on the operating room table. After adequate induction of general anesthesia the patient's left chest and neck area were prepped with ChloraPrep, allowed to dry, and draped in usual sterile manner. The area around the port was infiltrated with quarter percent Marcaine. A small incision was made through her old incision with the 15 blade knife. This incision was carried through the skin and subcutaneous tissue sharply with the 15 blade knife until the port was identified. The capsule of the port was opened sharply with the 15 blade knife. The 2 anchoring stitches were identified and removed. The port was then gently pushed out of its capsule. With gentle traction the port was removed and she tolerated this well. Pressure was held on the neck and chest area for several minutes until the area was completely hemostatic. The deep layer the wound was then closed with interrupted 3-0 Vicryl stitches. The skin was closed with a running 4-0 Monocryl subcuticular stitch. Dermabond dressings were applied. The patient tolerated the procedure well. At the end of the case all needle sponge and instrument counts were correct. The patient was then awakened and taken to recovery in stable condition.  PLAN OF CARE: Discharge  to home after PACU  PATIENT DISPOSITION:  PACU - hemodynamically stable.   Delay start of Pharmacological VTE agent (>24hrs) due to surgical blood loss or risk of bleeding: not applicable

## 2014-01-31 NOTE — Interval H&P Note (Signed)
History and Physical Interval Note:  01/31/2014 8:34 AM  Alison [name]  has presented today for surgery, with the diagnosis of Breat Cancer  The various methods of treatment have been discussed with the patient and family. After consideration of risks, benefits and other options for treatment, the patient has consented to  Procedure(s): REMOVAL PORT-A-CATH (N/A) as a surgical intervention .  The patient's history has been reviewed, patient examined, no change in status, stable for surgery.  I have reviewed the patient's chart and labs.  Questions were answered to the patient's satisfaction.     TOTH III,Dellas Guard S

## 2014-01-31 NOTE — Anesthesia Procedure Notes (Signed)
Procedure Name: LMA Insertion Date/Time: 01/31/2014 12:50 PM Performed by: Trixie Deis A Pre-anesthesia Checklist: Patient identified, Emergency Drugs available, Suction available, Patient being monitored and Timeout performed Patient Re-evaluated:Patient Re-evaluated prior to inductionOxygen Delivery Method: Circle system utilized Preoxygenation: Pre-oxygenation with 100% oxygen Intubation Type: IV induction Ventilation: Mask ventilation without difficulty LMA: LMA inserted LMA Size: 4.0 Number of attempts: 1 Placement Confirmation: positive ETCO2 and breath sounds checked- equal and bilateral Tube secured with: Tape

## 2014-01-31 NOTE — H&P (Signed)
Alison Alison [name] 01/13/2014 11:43 AM Location: [facility] Patient #: [id number] DOB: 12-05-1940 Divorced / Language: Alison Alison [name] / Race: Black or African American Female  History of Present Illness Alison Alison [name]. Marlou Starks MD; 01/13/2014 11:59 AM) Patient words: port removal.  The patient is a 74 year old female who presents for a follow-up for Breast cancer. The patient is a 74 year old black female who is 5 years status post right lumpectomy and axillary lymph node dissection for a T1b N2 right breast cancer. She underwent chemotherapy and radiation therapy and is still taking anastrozole. She is tolerating this well. Her oncologist has said she can have her port removed and she would like to have this done.   Other Problems (Alison Eversole, LPN; 14/48/1856 31:49 AM) Arthritis Breast Cancer High blood pressure Hypercholesterolemia  Past Surgical History (Alison Eversole, LPN; 70/26/3785 88:50 AM) Breast Biopsy Bilateral. Breast Mass; Local Excision Bilateral. Sentinel Lymph Node Biopsy  Diagnostic Studies History (Alison Eversole, LPN; 27/74/1287 86:76 AM) Colonoscopy 1-5 years ago Mammogram within last year Pap Smear 1-5 years ago  Allergies (Alison Eversole, LPN; 72/09/4707 62:83 AM) No Known Drug Allergies12/21/2015  Medication History (Alison Eversole, LPN; 66/29/4765 46:50 AM) Crestor (10MG  Tablet, Oral) Active. Travatan Z (0.004% Solution, Ophthalmic) Active. Losartan Potassium-HCTZ (100-25MG  Tablet, Oral) Active. Escitalopram Oxalate (10MG  Tablet, Oral) Active. Arimidex (1MG  Tablet, Oral) Active. Aspirin Childrens (81MG  Tablet Chewable, Oral) Active. Biotin (1000MCG Tablet, Oral) Active. Calcium Carbonate-Vitamin D (500-400MG -UNIT Tablet, Oral) Active. Fish Oil + D3 (1000-1000MG -UNIT Capsule, Oral) Active.  Pregnancy / Birth History Alison Borer, LPN; 35/46/5681 27:51 AM) Age at menarche 28 years. Age of menopause 41-50 Gravida 3 Irregular  periods Maternal age 22-20 Para 3  Review of Systems (Alison Eversole LPN; 70/01/7492 49:67 AM) General Present- Weight Gain. Not Present- Appetite Loss, Chills, Fatigue, Fever, Night Sweats and Weight Loss. Skin Present- Dryness. Not Present- Change in Wart/Mole, Hives, Jaundice, New Lesions, Non-Healing Wounds, Rash and Ulcer. HEENT Present- Wears glasses/contact lenses. Not Present- Earache, Hearing Loss, Hoarseness, Nose Bleed, Oral Ulcers, Ringing in the Ears, Seasonal Allergies, Sinus Pain, Sore Throat, Visual Disturbances and Yellow Eyes. Respiratory Present- Snoring. Not Present- Bloody sputum, Chronic Cough, Difficulty Breathing and Wheezing. Breast Not Present- Breast Mass, Breast Pain, Nipple Discharge and Skin Changes. Cardiovascular Present- Palpitations. Not Present- Chest Pain, Difficulty Breathing Lying Down, Leg Cramps, Rapid Heart Rate, Shortness of Breath and Swelling of Extremities. Gastrointestinal Not Present- Abdominal Pain, Bloating, Bloody Stool, Change in Bowel Habits, Chronic diarrhea, Constipation, Difficulty Swallowing, Excessive gas, Gets full quickly at meals, Hemorrhoids, Indigestion, Nausea, Rectal Pain and Vomiting. Female Genitourinary Not Present- Frequency, Nocturia, Painful Urination, Pelvic Pain and Urgency. Musculoskeletal Present- Joint Pain and Joint Stiffness. Not Present- Back Pain, Muscle Pain, Muscle Weakness and Swelling of Extremities. Psychiatric Present- Anxiety. Not Present- Bipolar, Change in Sleep Pattern, Depression, Fearful and Frequent crying. Endocrine Not Present- Cold Intolerance, Excessive Hunger, Hair Changes, Heat Intolerance, Hot flashes and New Diabetes. Hematology Not Present- Easy Bruising, Excessive bleeding, Gland problems, HIV and Persistent Infections.   Vitals (Alison Eversole LPN; 59/16/3846 65:99 AM) 01/13/2014 11:44 AM Weight: 191.8 lb Height: 63in Body Surface Area: 1.97 m Body Mass Index: 33.98 kg/m Temp.:  98.19F(Oral)  Pulse: 71 (Regular)  Resp.: 16 (Unlabored)  BP: 138/78 (Sitting, Left Arm, Standard)    Physical Exam Alison Alison [name] S. Marlou Starks MD; 01/13/2014 12:00 PM) General Mental Status-Alert. General Appearance-Consistent with stated age. Hydration-Well hydrated. Voice-Normal.  Head and Neck Head-normocephalic, atraumatic with no lesions or palpable masses. Trachea-midline. Thyroid Gland Characteristics - normal  size and consistency.  Eye Eyeball - Bilateral-Extraocular movements intact. Sclera/Conjunctiva - Bilateral-No scleral icterus.  Chest and Lung Exam Chest and lung exam reveals -quiet, even and easy respiratory effort with no use of accessory muscles and on auscultation, normal breath sounds, no adventitious sounds and normal vocal resonance. Inspection Chest Wall - Normal. Back - normal. Note: The port is located on the left chest wall and is tunneled over the clavicle to the left internal jugular vein   Cardiovascular Cardiovascular examination reveals -normal heart sounds, regular rate and rhythm with no murmurs and normal pedal pulses bilaterally.  Abdomen Inspection Inspection of the abdomen reveals - No Hernias. Skin - Scar - no surgical scars. Palpation/Percussion Palpation and Percussion of the abdomen reveal - Soft, Non Tender, No Rebound tenderness, No Rigidity (guarding) and No hepatosplenomegaly. Auscultation Auscultation of the abdomen reveals - Bowel sounds normal.  Neurologic Neurologic evaluation reveals -alert and oriented x 3 with no impairment of recent or remote memory. Mental Status-Normal.  Musculoskeletal Normal Exam - Left-Upper Extremity Strength Normal and Lower Extremity Strength Normal. Normal Exam - Right-Upper Extremity Strength Normal and Lower Extremity Strength Normal.  Lymphatic Head & Neck  General Head & Neck Lymphatics: Bilateral - Description - Normal. Axillary  General Axillary Region:  Bilateral - Description - Normal. Tenderness - Non Tender. Femoral & Inguinal  Generalized Femoral & Inguinal Lymphatics: Bilateral - Description - Normal. Tenderness - Non Tender.    Assessment & Plan Alison Alison [name] S. Marlou Starks MD; 01/13/2014 11:58 AM) PRIMARY CANCER OF RIGHT FEMALE BREAST (174.9  C50.911) Impression: The patient is 5 years status post right lumpectomy and axillary lymph node dissection for breast cancer. She has a Port-A-Cath in place on her left chest wall which I placed about 5 years ago. She would like to have this removed. I have discussed with her in detail the risks and benefits of the operation to remove the port as well as some of the technical aspects including the possibility of the tubing being adherent to the vein and she understands and wishes to proceed     Signed by Luella Cook, MD (01/13/2014 12:00 PM)

## 2014-01-31 NOTE — Anesthesia Preprocedure Evaluation (Signed)
Anesthesia Evaluation  Patient identified by MRN, date of birth, ID band Patient awake    Reviewed: Allergy & Precautions, NPO status , Patient's Chart, lab work & pertinent test results  History of Anesthesia Complications Negative for: history of anesthetic complications  Airway Mallampati: II  TM Distance: >3 FB Neck ROM: Full    Dental  (+) Teeth Intact,    Pulmonary neg pulmonary ROS,  breath sounds clear to auscultation        Cardiovascular hypertension, Pt. on medications Rhythm:Regular     Neuro/Psych PSYCHIATRIC DISORDERS Anxiety negative neurological ROS     GI/Hepatic Neg liver ROS, GERD-  Controlled,  Endo/Other  Morbid obesity  Renal/GU negative Renal ROS     Musculoskeletal  (+) Arthritis -,   Abdominal   Peds  Hematology negative hematology ROS (+)   Anesthesia Other Findings   Reproductive/Obstetrics                             Anesthesia Physical Anesthesia Plan  ASA: II  Anesthesia Plan: General   Post-op Pain Management:    Induction: Intravenous  Airway Management Planned: LMA  Additional Equipment: None  Intra-op Plan:   Post-operative Plan: Extubation in OR  Informed Consent: I have reviewed the patients History and Physical, chart, labs and discussed the procedure including the risks, benefits and alternatives for the proposed anesthesia with the patient or authorized representative who has indicated his/her understanding and acceptance.   Dental advisory given  Plan Discussed with: CRNA and Surgeon  Anesthesia Plan Comments:         Anesthesia Quick Evaluation

## 2014-01-31 NOTE — Anesthesia Postprocedure Evaluation (Signed)
  Anesthesia Post-op Note  Patient: Alison [name]  Procedure(s) Performed: Procedure(s): REMOVAL PORT-A-CATH (Left)  Patient Location: PACU  Anesthesia Type:General  Level of Consciousness: awake and alert   Airway and Oxygen Therapy: Patient Spontanous Breathing  Post-op Pain: none  Post-op Assessment: Post-op Vital signs reviewed, Patient's Cardiovascular Status Stable, Respiratory Function Stable, Patent Airway, No signs of Nausea or vomiting and Pain level controlled  Post-op Vital Signs: Reviewed and stable  Last Vitals:  Filed Vitals:   01/31/14 1358  BP: 150/67  Pulse: 67  Temp:   Resp: 15    Complications: No apparent anesthesia complications

## 2014-01-31 NOTE — Discharge Instructions (Signed)

## 2014-01-31 NOTE — Transfer of Care (Signed)
Immediate Anesthesia Transfer of Care Note  Patient: Alison [name]  Procedure(s) Performed: Procedure(s): REMOVAL PORT-A-CATH (Left)  Patient Location: PACU  Anesthesia Type:General  Level of Consciousness: awake, alert  and oriented  Airway & Oxygen Therapy: Patient Spontanous Breathing and Patient connected to nasal cannula oxygen  Post-op Assessment: Report given to PACU RN, Post -op Vital signs reviewed and stable and Patient moving all extremities X 4  Post vital signs: Reviewed and stable  Complications: No apparent anesthesia complications

## 2014-02-03 ENCOUNTER — Encounter (HOSPITAL_COMMUNITY): Payer: Self-pay | Admitting: General Surgery

## 2014-02-26 ENCOUNTER — Telehealth: Payer: Self-pay | Admitting: Hematology

## 2014-02-26 NOTE — Telephone Encounter (Signed)
Patient confirmed appoitment for Dec.Mailed calendar.

## 2014-03-26 ENCOUNTER — Other Ambulatory Visit: Payer: Self-pay | Admitting: Internal Medicine

## 2014-03-26 DIAGNOSIS — Z853 Personal history of malignant neoplasm of breast: Secondary | ICD-10-CM

## 2014-03-26 DIAGNOSIS — Z9889 Other specified postprocedural states: Secondary | ICD-10-CM

## 2014-04-09 ENCOUNTER — Other Ambulatory Visit: Payer: Self-pay | Admitting: Hematology

## 2014-04-09 DIAGNOSIS — Z9889 Other specified postprocedural states: Secondary | ICD-10-CM

## 2014-04-09 DIAGNOSIS — Z853 Personal history of malignant neoplasm of breast: Secondary | ICD-10-CM

## 2014-04-10 ENCOUNTER — Ambulatory Visit
Admission: RE | Admit: 2014-04-10 | Discharge: 2014-04-10 | Disposition: A | Discharge status: Home / Self Care | Payer: Medicare HMO | Source: Ambulatory Visit | Attending: Internal Medicine | Admitting: Internal Medicine

## 2014-04-10 DIAGNOSIS — Z853 Personal history of malignant neoplasm of breast: Secondary | ICD-10-CM

## 2014-04-10 DIAGNOSIS — Z9889 Other specified postprocedural states: Secondary | ICD-10-CM

## 2014-12-02 ENCOUNTER — Telehealth: Payer: Self-pay | Admitting: Hematology

## 2014-12-02 NOTE — Telephone Encounter (Signed)
returned pt call adn s.w. pt and confirmed DEc appt....pt ok and aware

## 2014-12-31 ENCOUNTER — Ambulatory Visit: Payer: Medicare HMO | Admitting: Hematology

## 2014-12-31 ENCOUNTER — Other Ambulatory Visit: Payer: Self-pay | Admitting: *Deleted

## 2014-12-31 ENCOUNTER — Other Ambulatory Visit: Payer: Medicare HMO

## 2014-12-31 DIAGNOSIS — C50919 Malignant neoplasm of unspecified site of unspecified female breast: Secondary | ICD-10-CM

## 2015-01-01 ENCOUNTER — Other Ambulatory Visit (HOSPITAL_BASED_OUTPATIENT_CLINIC_OR_DEPARTMENT_OTHER): Payer: Medicare HMO

## 2015-01-01 ENCOUNTER — Telehealth: Payer: Self-pay | Admitting: Hematology

## 2015-01-01 ENCOUNTER — Ambulatory Visit (HOSPITAL_BASED_OUTPATIENT_CLINIC_OR_DEPARTMENT_OTHER): Payer: Medicare HMO

## 2015-01-01 ENCOUNTER — Encounter: Payer: Self-pay | Admitting: Hematology

## 2015-01-01 ENCOUNTER — Ambulatory Visit (HOSPITAL_BASED_OUTPATIENT_CLINIC_OR_DEPARTMENT_OTHER): Payer: Medicare HMO | Admitting: Hematology

## 2015-01-01 VITALS — BP 135/60 | HR 75 | Temp 98.3°F | Resp 18 | Ht 64.5 in | Wt 192.6 lb

## 2015-01-01 DIAGNOSIS — Z853 Personal history of malignant neoplasm of breast: Secondary | ICD-10-CM

## 2015-01-01 DIAGNOSIS — Z23 Encounter for immunization: Secondary | ICD-10-CM | POA: Diagnosis not present

## 2015-01-01 DIAGNOSIS — C50411 Malignant neoplasm of upper-outer quadrant of right female breast: Secondary | ICD-10-CM

## 2015-01-01 DIAGNOSIS — C50919 Malignant neoplasm of unspecified site of unspecified female breast: Secondary | ICD-10-CM

## 2015-01-01 LAB — COMPREHENSIVE METABOLIC PANEL
ALK PHOS: 73 U/L (ref 40–150)
ALT: 17 U/L (ref 0–55)
AST: 22 U/L (ref 5–34)
Albumin: 3.9 g/dL (ref 3.5–5.0)
Anion Gap: 11 mEq/L (ref 3–11)
BUN: 18.6 mg/dL (ref 7.0–26.0)
CALCIUM: 9.8 mg/dL (ref 8.4–10.4)
CO2: 26 mEq/L (ref 22–29)
Chloride: 104 mEq/L (ref 98–109)
Creatinine: 0.9 mg/dL (ref 0.6–1.1)
EGFR: 76 mL/min/{1.73_m2} — ABNORMAL LOW (ref >90)
Glucose: 122 mg/dl (ref 70–140)
POTASSIUM: 3.5 meq/L (ref 3.5–5.1)
Sodium: 141 mEq/L (ref 136–145)
Total Bilirubin: 1.57 mg/dL — ABNORMAL HIGH (ref 0.20–1.20)
Total Protein: 7.5 g/dL (ref 6.4–8.3)

## 2015-01-01 LAB — CBC WITH DIFFERENTIAL/PLATELET
BASO%: 0.3 % (ref 0.0–2.0)
Basophils Absolute: 0 10*3/uL (ref 0.0–0.1)
EOS ABS: 0.1 10*3/uL (ref 0.0–0.5)
EOS%: 2.3 % (ref 0.0–7.0)
HCT: 37.1 % (ref 34.8–46.6)
HGB: 12.3 g/dL (ref 11.6–15.9)
LYMPH%: 24 % (ref 14.0–49.7)
MCH: 32 pg (ref 25.1–34.0)
MCHC: 33.2 g/dL (ref 31.5–36.0)
MCV: 96.6 fL (ref 79.5–101.0)
MONO#: 0.5 10*3/uL (ref 0.1–0.9)
MONO%: 11.5 % (ref 0.0–14.0)
NEUT#: 2.5 10*3/uL (ref 1.5–6.5)
NEUT%: 61.9 % (ref 38.4–76.8)
Platelets: 211 10*3/uL (ref 145–400)
RBC: 3.84 10*6/uL (ref 3.70–5.45)
RDW: 13.6 % (ref 11.2–14.5)
WBC: 4 10*3/uL (ref 3.9–10.3)
lymph#: 1 10*3/uL (ref 0.9–3.3)

## 2015-01-01 MED ORDER — ANASTROZOLE 1 MG PO TABS: 1.0000 mg | ORAL_TABLET | Freq: Every day | ORAL | Status: DC

## 2015-01-01 MED ORDER — INFLUENZA VAC SPLIT QUAD 0.5 ML IM SUSY
0.5000 mL | PREFILLED_SYRINGE | Freq: Once | INTRAMUSCULAR | Status: AC
  Administered 2015-01-01: 0.5 mL via INTRAMUSCULAR
  Filled 2015-01-01: qty 0.5

## 2015-01-01 NOTE — Progress Notes (Signed)
[place], MD [place] [address]  DIAGNOSIS: Malignant neoplasm of breast (female), Bilateral    ONCOLOGY HISTORY  1. 1998 left T2 with micrometastatic involvement of sentinel node, treated with lumpectomy and axillary node evaluation, chemo/RT/tamoxifen (only for a few years).   2. Spring 2010 right T1bN2 (6 of 23 nodes involved) invasive ductal ER/PR + and HER 2 - treated with  lumpectomy + axillary nodes, adjuvant adria/cytoxan/taxol, radiation and aromatase inhibitor (on now).  CURRENT THERAPY: Arimidex 1 mg daily since November 2010.   INTERVAL HISTORY: Alison [name] 74 y.o. female  history of bilateral breast carcinoma, currently on adjuvant Arimidex is here for follow-up. She was last seen by me one year ago. She complains about right knee pain, she may need knee replacement. She also has mild finger stiffness in the morning, which is stable. She otherwise is doing well overall, denies any other pain, dyspnea, gastrointestinal or other symptoms. She takes care of her 22-year-old great granddaughter 5 days a week, which keeps her busy.   MEDICAL HISTORY: Past Medical History  Diagnosis Date  . Breast cancer ([place])     X2  . Lichen sclerosus   . Glaucoma   . Hypertension   . Hyperlipidemia   . Shingles 2012  . Arthritis   . Cervical dysplasia   . Anxiety   . GERD (gastroesophageal reflux disease)     takes otc meds      ALLERGIES:  has No Known Allergies.  MEDICATIONS: has a current medication list which includes the following prescription(s): anastrozole, aspirin, calcium citrate-vitamin d, clobetasol cream, escitalopram, losartan-hydrochlorothiazide, meloxicam, omega-3 fatty acids, oxycodone-acetaminophen, rosuvastatin, travoprost (benzalkonium), klor-con m20, and omeprazole, and the following Facility-Administered Medications: sodium chloride.  SURGICAL HISTORY:  Past Surgical History   Procedure Laterality Date  . Hysteroscopy    . Tubal ligation    . Breast surgery      Lumpectomy right and left  . Gynecologic cryosurgery    . Port-a-cath removal Left 01/31/2014    Procedure: REMOVAL PORT-A-CATH;  Surgeon: Autumn Messing III, MD;  Location: [facility];  Service: General;  Laterality: Left;    REVIEW OF SYSTEMS:   Constitutional: Denies fevers, chills or abnormal weight loss            Eyes: Denies blurriness of vision Ears, nose, mouth, throat, and face: Denies mucositis or sore throat Respiratory: Denies cough, dyspnea or wheezes Cardiovascular: Denies palpitation, chest discomfort or lower extremity swelling Gastrointestinal:  Denies nausea, heartburn or change in bowel habits Skin: Denies abnormal skin rashes Lymphatics: Denies new lymphadenopathy or easy bruising Neurological:Denies numbness, tingling or new weaknesses Behavioral/Psych: Mood is stable, no new changes  All other systems were reviewed with the patient and are negative.  PHYSICAL EXAMINATION: ECOG PERFORMANCE STATUS: 1  Blood pressure 135/60, pulse 75, temperature 98.3 F (36.8 C), temperature source Oral, resp. rate 18, height 5' 4.5" (1.638 m), weight 192 lb 9.6 oz (87.363 kg), last menstrual period 01/25/2004, SpO2 100 %.  GENERAL:alert, no distress and comfortable SKIN: skin color, texture, turgor are normal, no rashes or significant lesions; L Portacath-without erythema or tenderness, flushed today EYES: normal, Conjunctiva are pink and non-injected, sclera clear OROPHARYNX:no exudate, no erythema and lips, buccal mucosa, and tongue normal  NECK: supple, thyroid normal size, non-tender, without nodularity LYMPH:  no palpable lymphadenopathy in the cervical, axillary or supraclavicular LUNGS: clear to auscultation and percussion with normal breathing effort HEART: regular rate &  rhythm and no murmurs and no lower extremity edema ABDOMEN:abdomen soft, non-tender and normal bowel  sounds Musculoskeletal:no cyanosis of digits and no clubbing  BREASTS: bilateral lumpectomy scars well healed, no dominant mass, no skin or nipple findings of concern, axillae and UE not remarkable.  NEURO: alert & oriented x 3 with fluent speech, no focal motor/sensory deficits  Labs:  CBC Latest Ref Rng 01/01/2015 01/29/2014 12/27/2013  WBC 3.9 - 10.3 10e3/uL 4.0 4.8 5.3  Hemoglobin 11.6 - 15.9 g/dL 12.3 12.5 12.5  Hematocrit 34.8 - 46.6 % 37.1 38.1 36.7  Platelets 145 - 400 10e3/uL 211 244 260    CMP Latest Ref Rng 01/01/2015 01/29/2014 12/27/2013  Glucose 70 - 140 mg/dl 122 100(H) 80  BUN 7.0 - 26.0 mg/dL 18.6 14 15.4  Creatinine 0.6 - 1.1 mg/dL 0.9 0.77 0.8  Sodium 136 - 145 mEq/L 141 141 141  Potassium 3.5 - 5.1 mEq/L 3.5 3.4(L) 3.7  Chloride 96 - 112 mEq/L - 101 -  CO2 22 - 29 mEq/L '26 30 28  ' Calcium 8.4 - 10.4 mg/dL 9.8 9.8 10.1  Total Protein 6.4 - 8.3 g/dL 7.5 - -  Total Bilirubin 0.20 - 1.20 mg/dL 1.57(H) - -  Alkaline Phos 40 - 150 U/L 73 - -  AST 5 - 34 U/L 22 - -  ALT 0 - 55 U/L 17 - -      RADIOGRAPHIC STUDIES:04/08/2013 DIGITAL DIAGNOSTIC BILATERAL MAMMOGRAM WITH CAD COMPARISON: With priors. ACR Breast Density Category c: The breast tissue is heterogeneously dense, which may obscure small masses. FINDINGS: No suspicious mass or malignant type microcalcifications seen in either breast. Stable lumpectomy changes are seen bilaterally.  Mammographic images were processed with CAD. IMPRESSION: No evidence of malignancy in either breast.  RECOMMENDATION: Bilateral diagnostic mammogram in 1 year is recommended. BI-RADS CATEGORY 2: Benign finding(s).  ASSESSMENT: Alison [name] 74 y.o. female with a history of Malignant neoplasm of breast (female), currently on adjuvant hormonal therapy.   1. Right upper outer quadrant breast invasive ductal carcinoma, stage IIIA, ER and PR positive, HER-2 negative -She has been on adjuvant anastrozole for 6 years now. The reason the clinical  trial data demonstrated significant higher rate of disease-free survival and a low incidence of contralateral breast cancer with extended 10 year aromatase inhibitor (NEJM 2016, 375:209). Giving her advanced stage, I recommend her to continue anastrozole for total of 10 years. She is tolerating anastrozole well overall, has mild side effects which is tolerable, agrees to continue. -Refill prescription was given to her. -Next mammogram is due in March 2016. She is also due for bone density scan. Her last one was in January 2013. I ordered the scans today.  -I encouraged her to continue healthy diet and exercise regularly -I reviewed her anastrozole today.  2. HTN, arthritis -She'll continue follow-up with her primary care physician.  Follow-up: One year with labs CBC and CMP  All questions were answered. The patient knows to call the clinic with any problems, questions or concerns. We can certainly see the patient much sooner if necessary.  I spent 10 minutes counseling the patient face to face. The total time spent in the appointment was 15 minutes.    Truitt Merle, MD 01/01/2015 10:38 AM

## 2015-01-01 NOTE — Telephone Encounter (Signed)
per pof to sch pt appt-sch mamma & DEXA pt aware-gave avs

## 2015-03-30 ENCOUNTER — Ambulatory Visit (INDEPENDENT_AMBULATORY_CARE_PROVIDER_SITE_OTHER): Payer: Medicare HMO | Admitting: Gynecology

## 2015-03-30 ENCOUNTER — Encounter: Payer: Self-pay | Admitting: Gynecology

## 2015-03-30 VITALS — BP 124/82

## 2015-03-30 DIAGNOSIS — N939 Abnormal uterine and vaginal bleeding, unspecified: Secondary | ICD-10-CM

## 2015-03-30 DIAGNOSIS — L900 Lichen sclerosus et atrophicus: Secondary | ICD-10-CM | POA: Diagnosis not present

## 2015-03-30 DIAGNOSIS — N898 Other specified noninflammatory disorders of vagina: Secondary | ICD-10-CM

## 2015-03-30 LAB — WET PREP FOR TRICH, YEAST, CLUE
Clue Cells Wet Prep HPF POC: NONE SEEN
TRICH WET PREP: NONE SEEN
Yeast Wet Prep HPF POC: NONE SEEN

## 2015-03-30 NOTE — Addendum Note (Signed)
Addended by: Nelva Nay on: 03/30/2015 09:30 AM   Modules accepted: Orders

## 2015-03-30 NOTE — Addendum Note (Signed)
Addended by: Joaquin Music on: 03/30/2015 09:57 AM   Modules accepted: Orders

## 2015-03-30 NOTE — Progress Notes (Signed)
    Alison [name] November 20, 1940 ED:[id number]        75 y.o.  [id number] Presents with an episode of vaginal bleeding last week. She felt that it came from her outside due to irritation because she was also having some irritation and itching. Started her Temovate 0.05% cream that she uses for her lichen sclerosis and notes that the irritation and bleeding has resolved. Not having any pain such as cramping or pelvic discomfort. No urinary symptoms such as frequency dysuria urgency. No bowel issues as far as discomfort constipation or diarrhea.  Past medical history,surgical history, problem list, medications, allergies, family history and social history were all reviewed and documented in the EPIC chart.  Directed ROS with pertinent positives and negatives documented in the history of present illness/assessment and plan.  Exam: Caryn Bee assistant Filed Vitals:   03/30/15 0829  BP: 124/82   General appearance:  Normal Abdomen soft nontender without masses guarding rebound Pelvic external BUS vagina with atrophic changes. Symmetrical overt areas of irritation or bleeding. Vagina with atrophic changes. No significant discharge. Cervix atrophic. Uterus grossly normal midline mobile nontender. Adnexa without masses or tenderness  Assessment/Plan:  75 y.o. [id number] with an episode of vaginal bleeding consistent with irritation from her lichen sclerosis. Has used Temovate with good results. We'll continue nightly for several weeks and then wean herself off. I did recommend baseline sonohysterogram to rule out endometrial source that she will schedule. Wet prep was negative. Patient will follow up for ultrasound and the importance of follow up stressed.    Anastasio Auerbach MD, 9:16 AM 03/30/2015

## 2015-03-30 NOTE — Patient Instructions (Signed)
Apply the vaginal cream externally every night for the next several weeks and then wean off of it. Follow up at the vaginal irritation continues. Follow up for the ultrasound as scheduled.

## 2015-03-31 ENCOUNTER — Other Ambulatory Visit: Payer: Self-pay | Admitting: Gynecology

## 2015-03-31 DIAGNOSIS — N950 Postmenopausal bleeding: Secondary | ICD-10-CM

## 2015-04-15 ENCOUNTER — Ambulatory Visit
Admission: RE | Admit: 2015-04-15 | Discharge: 2015-04-15 | Disposition: A | Discharge status: Home / Self Care | Payer: Medicare HMO | Source: Ambulatory Visit | Attending: Hematology | Admitting: Hematology

## 2015-04-15 DIAGNOSIS — C50919 Malignant neoplasm of unspecified site of unspecified female breast: Secondary | ICD-10-CM

## 2015-04-15 DIAGNOSIS — Z853 Personal history of malignant neoplasm of breast: Secondary | ICD-10-CM

## 2015-04-15 DIAGNOSIS — C50411 Malignant neoplasm of upper-outer quadrant of right female breast: Secondary | ICD-10-CM

## 2015-04-21 ENCOUNTER — Telehealth: Payer: Self-pay | Admitting: Gynecology

## 2015-04-21 NOTE — Telephone Encounter (Signed)
04/20/15-I informed pt that her [employer] plan will cover the sonohysterogram with her $45 copay and 20% coins on the ultrasound part only which is $43.76. Total pt responsibility is $88.76. She had me reschedule procedure to May stating she has a lot coming up right now. Per [place] at [email or web address]

## 2015-04-29 ENCOUNTER — Other Ambulatory Visit: Payer: Medicare HMO

## 2015-04-29 ENCOUNTER — Ambulatory Visit: Payer: Medicare HMO | Admitting: Gynecology

## 2015-06-04 ENCOUNTER — Ambulatory Visit (INDEPENDENT_AMBULATORY_CARE_PROVIDER_SITE_OTHER): Payer: Medicare HMO | Admitting: Sports Medicine

## 2015-06-04 ENCOUNTER — Encounter: Payer: Self-pay | Admitting: Sports Medicine

## 2015-06-04 VITALS — BP 121/73 | HR 75 | Resp 18 | Wt 192.2 lb

## 2015-06-04 DIAGNOSIS — M7062 Trochanteric bursitis, left hip: Secondary | ICD-10-CM

## 2015-06-04 DIAGNOSIS — M170 Bilateral primary osteoarthritis of knee: Secondary | ICD-10-CM

## 2015-06-04 NOTE — Progress Notes (Signed)
  Subjective:    CC: Bilateral knee and left hip pain  HPI: Bilateral knee pain: Known osteoarthritis, did not finish the series of Orthovisc last time, having recurrence of pain. Moderate, persistent, localized at the joint lines without radiation or mechanical symptoms.  Left hip pain: Localized over the lateral hip, moderate, persistent without radiation.  Past medical history, Surgical history, Family history not pertinant except as noted below, Social history, Allergies, and medications have been entered into the medical record, reviewed, and no changes needed.   Review of Systems: No fevers, chills, night sweats, weight loss, chest pain, or shortness of breath.   Objective:    General: Well Developed, well nourished, and in no acute distress.  Neuro: Alert and oriented x3, extra-ocular muscles intact, sensation grossly intact.  HEENT: Normocephalic, atraumatic, pupils equal round reactive to light, neck supple, no masses, no lymphadenopathy, thyroid nonpalpable.  Skin: Warm and dry, no rashes. Cardiac: Regular rate and rhythm, no murmurs rubs or gallops, no lower extremity edema.  Respiratory: Clear to auscultation bilaterally. Not using accessory muscles, speaking in full sentences. Left hip: Tender over the trochanteric bursa Knees: Tender to palpation at the medial joint line with minimal swelling.  Procedure: Real-time Ultrasound Guided Injection of right knee Device: GE Logiq E  Verbal informed consent obtained.  Time-out conducted.  Noted no overlying erythema, induration, or other signs of local infection.  Skin prepped in a sterile fashion.  Local anesthesia: Topical Ethyl chloride.  With sterile technique and under real time ultrasound guidance:  1 mL kenalog 40, 2 mL lidocaine, 2 mL Marcaine injected easily Completed without difficulty  Pain immediately resolved suggesting accurate placement of the medication.  Advised to call if fevers/chills, erythema, induration,  drainage, or persistent bleeding.  Images permanently stored and available for review in the ultrasound unit.  Impression: Technically successful ultrasound guided injection.  Procedure: Real-time Ultrasound Guided Injection of left knee Device: GE Logiq E  Verbal informed consent obtained.  Time-out conducted.  Noted no overlying erythema, induration, or other signs of local infection.  Skin prepped in a sterile fashion.  Local anesthesia: Topical Ethyl chloride.  With sterile technique and under real time ultrasound guidance:  1 mL kenalog 40, 2 mL lidocaine, 2 mL Marcaine injected easily Completed without difficulty  Pain immediately resolved suggesting accurate placement of the medication.  Advised to call if fevers/chills, erythema, induration, drainage, or persistent bleeding.  Images permanently stored and available for review in the ultrasound unit.  Impression: Technically successful ultrasound guided injection.  Procedure: Real-time Ultrasound Guided Injection of left trochanteric bursa Device: GE Logiq E  Verbal informed consent obtained.  Time-out conducted.  Noted no overlying erythema, induration, or other signs of local infection.  Skin prepped in a sterile fashion.  Local anesthesia: Topical Ethyl chloride.  With sterile technique and under real time ultrasound guidance:  1 mL kenalog 40, 2 mL lidocaine, 2 mL Marcaine injected easily Completed without difficulty  Pain immediately resolved suggesting accurate placement of the medication.  Advised to call if fevers/chills, erythema, induration, drainage, or persistent bleeding.  Images permanently stored and available for review in the ultrasound unit.  Impression: Technically successful ultrasound guided injection.  Impression and Recommendations:

## 2015-06-04 NOTE — Assessment & Plan Note (Signed)
Bilateral knee injection as above. Return in one month.

## 2015-06-04 NOTE — Assessment & Plan Note (Signed)
Injection. Physical therapy. Return in one month.

## 2015-06-08 ENCOUNTER — Ambulatory Visit: Payer: Medicare HMO | Admitting: Gynecology

## 2015-06-08 ENCOUNTER — Other Ambulatory Visit: Payer: Medicare HMO

## 2015-06-16 ENCOUNTER — Ambulatory Visit (INDEPENDENT_AMBULATORY_CARE_PROVIDER_SITE_OTHER): Payer: Medicare HMO | Admitting: Sports Medicine

## 2015-06-16 ENCOUNTER — Encounter: Payer: Self-pay | Admitting: Sports Medicine

## 2015-06-16 ENCOUNTER — Ambulatory Visit (INDEPENDENT_AMBULATORY_CARE_PROVIDER_SITE_OTHER): Payer: Medicare HMO

## 2015-06-16 VITALS — BP 138/77 | HR 84 | Resp 18 | Wt 194.7 lb

## 2015-06-16 DIAGNOSIS — M170 Bilateral primary osteoarthritis of knee: Secondary | ICD-10-CM | POA: Diagnosis not present

## 2015-06-16 DIAGNOSIS — M7062 Trochanteric bursitis, left hip: Secondary | ICD-10-CM | POA: Diagnosis not present

## 2015-06-16 DIAGNOSIS — M4317 Spondylolisthesis, lumbosacral region: Secondary | ICD-10-CM | POA: Diagnosis not present

## 2015-06-16 DIAGNOSIS — M5416 Radiculopathy, lumbar region: Secondary | ICD-10-CM | POA: Insufficient documentation

## 2015-06-16 MED ORDER — HYDROCODONE-ACETAMINOPHEN 5-325 MG PO TABS: 1.0000 | ORAL_TABLET | Freq: Three times a day (TID) | ORAL | Status: DC | PRN

## 2015-06-16 MED ORDER — PREDNISONE 50 MG PO TABS: ORAL_TABLET | ORAL | Status: DC

## 2015-06-16 MED ORDER — GABAPENTIN 300 MG PO CAPS: ORAL_CAPSULE | ORAL | Status: DC

## 2015-06-16 NOTE — Assessment & Plan Note (Signed)
Doing well after bilateral injections the last visit, not yet ready to consider viscosupplementation

## 2015-06-16 NOTE — Assessment & Plan Note (Signed)
Trochanteric bursa pain has improved significant, she is however having some left-sided radiculopathy. Prednisone, gabapentin, x-rays of the lumbar spine, physical therapy needs to be centered more in the lumbar spine. Hydrocodone for severe pain.  Return to see me in one month.

## 2015-06-16 NOTE — Progress Notes (Signed)
  Subjective:    CC: Persistent hip and leg pain  HPI: Primary osteoarthritis of both knees: Resolved after injections at the last visit.  Trochanteric bursitis: Left-sided, much better after the injection, she is continuing to have some pain that she describes running down the lateral thigh with burning type sensation down to the lateral aspect of her foot. Moderate, persistent, no bowel or bladder dysfunction, saddle numbness, no constitutional symptoms.  Past medical history, Surgical history, Family history not pertinant except as noted below, Social history, Allergies, and medications have been entered into the medical record, reviewed, and no changes needed.   Review of Systems: No fevers, chills, night sweats, weight loss, chest pain, or shortness of breath.   Objective:    General: Well Developed, well nourished, and in no acute distress.  Neuro: Alert and oriented x3, extra-ocular muscles intact, sensation grossly intact.  HEENT: Normocephalic, atraumatic, pupils equal round reactive to light, neck supple, no masses, no lymphadenopathy, thyroid nonpalpable.  Skin: Warm and dry, no rashes. Cardiac: Regular rate and rhythm, no murmurs rubs or gallops, no lower extremity edema.  Respiratory: Clear to auscultation bilaterally. Not using accessory muscles, speaking in full sentences. Left Hip: ROM IR: 60 Deg, ER: 60 Deg, Flexion: 120 Deg, Extension: 100 Deg, Abduction: 45 Deg, Adduction: 45 Deg Strength IR: 5/5, ER: 5/5, Flexion: 5/5, Extension: 5/5, Abduction: 5/5, Adduction: 5/5 Pelvic alignment unremarkable to inspection and palpation. Standing hip rotation and gait without trendelenburg / unsteadiness. Greater trochanter without tenderness to palpation. No tenderness over piriformis. No SI joint tenderness and normal minimal SI movement. Hip abductors are very weak Negative straight leg raise  CT of the abdomen and pelvis from 2010 was reviewed, multilevel disc protrusions  with his baseline L4-L5 and L5-S1 with vacuum phenomenon at L5-S1.  Impression and Recommendations:

## 2015-07-02 ENCOUNTER — Ambulatory Visit: Payer: Medicare HMO | Admitting: Sports Medicine

## 2015-07-09 ENCOUNTER — Ambulatory Visit (INDEPENDENT_AMBULATORY_CARE_PROVIDER_SITE_OTHER): Payer: Medicare HMO | Admitting: Physical Therapy

## 2015-07-09 ENCOUNTER — Encounter: Payer: Self-pay | Admitting: Sports Medicine

## 2015-07-09 ENCOUNTER — Encounter: Payer: Self-pay | Admitting: Physical Therapy

## 2015-07-09 ENCOUNTER — Ambulatory Visit (INDEPENDENT_AMBULATORY_CARE_PROVIDER_SITE_OTHER): Payer: Medicare HMO | Admitting: Sports Medicine

## 2015-07-09 VITALS — BP 145/80 | HR 91 | Ht 64.5 in | Wt 192.0 lb

## 2015-07-09 DIAGNOSIS — M6281 Muscle weakness (generalized): Secondary | ICD-10-CM

## 2015-07-09 DIAGNOSIS — M545 Low back pain, unspecified: Secondary | ICD-10-CM

## 2015-07-09 DIAGNOSIS — M5416 Radiculopathy, lumbar region: Secondary | ICD-10-CM | POA: Diagnosis not present

## 2015-07-09 DIAGNOSIS — R293 Abnormal posture: Secondary | ICD-10-CM

## 2015-07-09 DIAGNOSIS — M25552 Pain in left hip: Secondary | ICD-10-CM

## 2015-07-09 NOTE — Assessment & Plan Note (Signed)
Symptoms are 80% better with gabapentin alone, has her first physical therapy session today. Return in one month as needed

## 2015-07-09 NOTE — Progress Notes (Signed)
  Subjective:    CC: follow-up  HPI: This is a very pleasant 75 year old female, she had some back pain radiating to both hips at the last visit, initially we treated her as trochanteric bursitis, trochanteric bursa injections resolved her localized pain, subsequently her pain was in her back with radiation down the left leg, we started gabapentin, she is currently taking it twice a day and notes that her pain is 80% better. She has her first physical therapy session today. X-rays were reviewed and did show multilevel spondylosis with grade 1 spondylolisthesis of L5 on S1.  Past medical history, Surgical history, Family history not pertinant except as noted below, Social history, Allergies, and medications have been entered into the medical record, reviewed, and no changes needed.   Review of Systems: No fevers, chills, night sweats, weight loss, chest pain, or shortness of breath.   Objective:    General: Well Developed, well nourished, and in no acute distress.  Neuro: Alert and oriented x3, extra-ocular muscles intact, sensation grossly intact.  HEENT: Normocephalic, atraumatic, pupils equal round reactive to light, neck supple, no masses, no lymphadenopathy, thyroid nonpalpable.  Skin: Warm and dry, no rashes. Cardiac: Regular rate and rhythm, no murmurs rubs or gallops, no lower extremity edema.  Respiratory: Clear to auscultation bilaterally. Not using accessory muscles, speaking in full sentences.  Impression and Recommendations:    I spent 25 minutes with this patient, greater than 50% was face-to-face time counseling regarding the above diagnoses

## 2015-07-09 NOTE — Patient Instructions (Signed)
Use heat once a day, 15-20 minutes on low back and buttocks.   [place] [phone]  Quads / HF, Prone    Lie face down, knees together. Grasp one ankle with same-side hand. Use towel if needed to reach. Gently pull foot toward buttock. Hold _45__ seconds. Repeat __1_ times per session. Do __2_ sessions per day. Repeat on the other side.    Quads / HF, Supine    Lie near edge of bed, one leg bent, foot flat on bed. Other leg hanging over edge, relaxed, thigh resting entirely on bed. Bend hanging knee backward keeping thigh in contact with bed. Keep belly tight to protect your back. Hold _45__ seconds. Repeat __1_ times per session. Do __2_ sessions per day. Repeat on the other side.    Supine Knee-to-Chest, Unilateral    Lie on back, hands clasped behind one knee. Pull knee in toward chest until a comfortable stretch is felt in lower back and buttocks. Hold _45__ seconds.  Repeat __1_ times per session. Do _2__ sessions per day. Repeat on the other side  Copyright  VHI. All rights reserved.

## 2015-07-09 NOTE — Therapy (Addendum)
[facility] [place], [place], [address] Phone: [phone]   Fax:  [phone]  Physical Therapy Evaluation  Patient Details  Name: KATLYNN NASER MRN: [id number] Date of Birth: Jul 31, 1940 Referring Provider: Dr Dianah Field  Encounter Date: 07/09/2015      PT End of Session - 07/09/15 1529    Visit Number 1   Number of Visits 6   Date for PT Re-Evaluation 08/20/15   PT Start Time 1440   PT Stop Time 1543   PT Time Calculation (min) 63 min      Past Medical History  Diagnosis Date  . Breast cancer ([facility])     X2  . Lichen sclerosus   . Glaucoma   . Hypertension   . Hyperlipidemia   . Shingles 2012  . Arthritis   . Cervical dysplasia   . Anxiety   . GERD (gastroesophageal reflux disease)     takes otc meds    Past Surgical History  Procedure Laterality Date  . Hysteroscopy    . Tubal ligation    . Breast surgery      Lumpectomy right and left  . Gynecologic cryosurgery    . Port-a-cath removal Left 01/31/2014    Procedure: REMOVAL PORT-A-CATH;  Surgeon: Autumn Messing III, MD;  Location: [facility];  Service: General;  Laterality: Left;    There were no vitals filed for this visit.       Subjective Assessment - 07/09/15 1443    Subjective Pt reports about a month ago she developed Lt hip pain after being at her granddaughters ball game, her back started bothering her a couple of weeks ago on the Lt side, can't think of anything she did.  She has had an injection in the Lt hip  about a month ago, had relief for one week. She feels like the back and hip are related. Now having some Rt knee pain   Pertinent History Fall in2/2016 landing on the  Lt side and did well after that.    How long can you sit comfortably? not limited - has pain with transitioning to stand   How long can you walk comfortably? has to take her time   Diagnostic tests x-rays, hip and back, disc in her back is out.    Patient Stated  Goals get Lt LE stronger   Currently in Pain? Yes   Pain Score 2   goes up to 8/10 at its worse   Pain Location Back   Pain Orientation Left   Pain Descriptors / Indicators Tightness   Pain Type Acute pain   Pain Onset More than a month ago   Pain Frequency Intermittent   Aggravating Factors  not sure   Pain Relieving Factors rest, heat, ice   Multiple Pain Sites Yes   Pain Score 2   Pain Location Hip   Pain Orientation Left;Lateral   Pain Descriptors / Indicators Sore   Pain Type Acute pain   Pain Onset More than a month ago   Pain Frequency Intermittent   Aggravating Factors  night time wakes her up   Pain Relieving Factors moving it around, hang it out of bed, gabapentin            [facility] PT Assessment - 07/09/15 0001    Assessment   Medical Diagnosis Lt hip bursitis, Lt lumbar radiculopahty   Referring Provider Dr Dianah Field   Onset Date/Surgical Date 06/08/15   Hand Dominance Right  Next MD Visit PRN   Prior Therapy none   Precautions   Precautions None   Balance Screen   Has the patient fallen in the past 6 months No   Has the patient had a decrease in activity level because of a fear of falling?  No   Is the patient reluctant to leave their home because of a fear of falling?  No   Home Environment   Living Environment Private residence   [place] level  takes her time, takes one at a time Rt first.    Prior Function   Level of [place] Retired   Leisure shopping, go to movies.    Observation/Other Assessments   Focus on Therapeutic Outcomes (FOTO)  46% limited   Functional Tests   Functional tests Squat;Single leg stance   Squat   Comments shifts to the Lt, pulling on Rt LE   Single Leg Stance   Comments Lt WNL, Rt 8 sec   Posture/Postural Control   Posture/Postural Control Postural limitations   Postural Limitations Forward head;Rounded Shoulders;Flexed trunk   Posture Comments Rt  LE valgus, good pelvic alignment   ROM / Strength   AROM / PROM / Strength AROM;Strength   AROM   AROM Assessment Site Lumbar   Lumbar Flexion WNL   Lumbar Extension decreased 75% with bilat LBP   Lumbar - Right Side Bend WNL   Lumbar - Left Side Bend WNL   Lumbar - Right Rotation WNL   Lumbar - Left Rotation decreased 50% with lateral Lt LE pain    Strength   Strength Assessment Site Hip;Knee;Ankle;Lumbar   Right/Left Hip Right;Left   Right Hip Flexion 4+/5   Right Hip Extension 4+/5   Right Hip ABduction 3+/5   Left Hip Flexion --  5-/5   Left Hip Extension 4-/5   Left Hip ABduction 4-/5   Right/Left Knee --  bilat WNL   Right/Left Ankle --  bilat WNL   Lumbar Flexion --  TA poor   Lumbar Extension --  multifidi poor   Flexibility   Soft Tissue Assessment /Muscle Length yes   Hamstrings bilat good   Quadriceps prone knee flexion Rt 85 degrees, Lt 100  bilat hip flexors tight.    ITB bilat WNL   Piriformis tight bilat   Palpation   Spinal mobility lumbar WNL CPA, bilat UPA   Palpation comment very tight in bilat lumbar parspinals and QLs, posterior Rt greater troch.    Special Tests    Special Tests --  (-) slump and SLR bilat   Ambulation/Gait   Ambulation/Gait Yes   Ambulation/Gait Assistance 7: Independent   Ambulation Distance (Feet) --  in gym   Assistive device None   Gait Pattern Antalgic                   [facility] Adult PT Treatment/Exercise - 07/09/15 0001    Exercises   Exercises Lumbar   Lumbar Exercises: Stretches   Single Knee to Chest Stretch --  45 sec each side   Quad Stretch --  prone 45 sec each side   Quad Stretch Limitations hip flexor with leg off EOB 45 sec each side   Modalities   Modalities Electrical Stimulation;Moist Heat   Moist Heat Therapy   Number Minutes Moist Heat 15 Minutes   Moist Heat Location Lumbar Spine  and Lt hip   Electrical Stimulation   Electrical Stimulation  Location lumbar and Lt hip   Electrical  Stimulation Action IFC    Electrical Stimulation Parameters to tolerance   Electrical Stimulation Goals Pain;Tone                PT Education - 07/09/15 1519    Education provided Yes   Education Details HEP    Person(s) Educated Patient   Methods Verbal cues;Demonstration;Explanation   Comprehension Verbalized understanding;Returned demonstration             PT Long Term Goals - 07/09/15 1421    PT LONG TERM GOAL #1   Title improve FOTO =/< 39% limited, CJ level  ( 08/20/15)    Time 6   Period Weeks   Status New   PT LONG TERM GOAL #2   Title report overall reduction in pain =/> 75% with daily activity ( 08/20/15)    Time 6   Period Weeks   Status New   PT LONG TERM GOAL #3   Title improve prone quad flexibility bilateral =/> 115 degrees ( 08/20/15)    Time 6   Period Weeks   Status New   PT LONG TERM GOAL #4   Title demo full painfree lumbar ROM ( 08/20/15)    Time 6   Period Weeks   Status New   PT LONG TERM GOAL #5   Title demo bilat hip strength =/> 5-/5 ( 08/20/15)    Time 6   Period Weeks   Status New               Plan - 07/09/15 1751    Clinical Impression Statement 75 yo female presents with couple month h/o Lt hip and low back pain,  She is very tight in her soft tissue in her anterior body and has tenderness in bilateral lumbar paraspinals. She is weak in her hip and core as well.   Patient is only able to attend sessions once a week.    Rehab Potential Good   PT Frequency 2x / week   PT Duration 6 weeks   PT Treatment/Interventions Ultrasound;Traction;Neuromuscular re-education;Patient/family education;Dry needling;Cryotherapy;Electrical Stimulation;Iontophoresis 43m/ml Dexamethasone;Moist Heat;Therapeutic exercise;Manual techniques   PT Next Visit Plan progress core and hip strengthening, manual work PRN to loosen quads and hip flexors.    Consulted and Agree with Plan of Care Patient      Patient will benefit from skilled  therapeutic intervention in order to improve the following deficits and impairments:  Postural dysfunction, Decreased strength, Impaired flexibility, Pain, Decreased range of motion, Increased muscle spasms  Visit Diagnosis: Left-sided low back pain without sciatica - Plan: PT plan of care cert/re-cert  Pain in left hip - Plan: PT plan of care cert/re-cert  Muscle weakness (generalized) - Plan: PT plan of care cert/re-cert  Abnormal posture - Plan: PT plan of care cert/re-cert     Problem List Patient Active Problem List   Diagnosis Date Noted  . Left lumbar radiculopathy 06/16/2015  . Trochanteric bursitis of left hip 06/04/2015  . History of left breast cancer 01/01/2015  . Primary osteoarthritis of both knees 04/17/2013  . Tendinitis, de Quervain's 01/20/2012  . Lichen sclerosus   . Breast cancer of upper-outer quadrant of right female breast ([facility] 09/12/2011  . Shingles     SJeral PinchPT  07/09/2015, [address] [address]Phone: [phone]  Fax:  [phone] Name: BDAYONA SHAHEENMRN: [id number] of  Birth: [birth year]   PHYSICAL THERAPY DISCHARGE SUMMARY  Visits from Start of Care: 1  Current functional level related to goals / functional outcomes: unknown   Remaining deficits: unknown   Education / Equipment: HEP Plan: Patient agrees to discharge.  Patient goals were not met. Patient is being discharged due to the patient's request. Patient reports she cannot afford to attend PT  ?????    Jeral Pinch, PT 07/20/2015 9:25 AM

## 2015-07-22 ENCOUNTER — Ambulatory Visit: Payer: Medicare HMO | Admitting: Gynecology

## 2015-07-22 ENCOUNTER — Other Ambulatory Visit: Payer: Medicare HMO

## 2015-09-22 ENCOUNTER — Ambulatory Visit (INDEPENDENT_AMBULATORY_CARE_PROVIDER_SITE_OTHER): Payer: Medicare HMO

## 2015-09-22 ENCOUNTER — Ambulatory Visit (INDEPENDENT_AMBULATORY_CARE_PROVIDER_SITE_OTHER): Payer: Medicare HMO | Admitting: Orthopaedic Surgery

## 2015-09-22 ENCOUNTER — Encounter: Payer: Self-pay | Admitting: Orthopaedic Surgery

## 2015-09-22 VITALS — BP 136/63 | HR 69 | Temp 96.8°F | Ht 64.0 in | Wt 197.0 lb

## 2015-09-22 DIAGNOSIS — M25561 Pain in right knee: Secondary | ICD-10-CM | POA: Diagnosis not present

## 2015-09-22 MED ORDER — NAPROXEN 500 MG PO TABS: 500.0000 mg | ORAL_TABLET | Freq: Two times a day (BID) | ORAL | 5 refills | Status: DC

## 2015-09-22 MED ORDER — HYDROCODONE-ACETAMINOPHEN 5-325 MG PO TABS: 1.0000 | ORAL_TABLET | ORAL | 0 refills | Status: DC | PRN

## 2015-09-22 NOTE — Progress Notes (Signed)
Patient HC:[id number] Alison [name], female DOB:07/04/1940, 75 y.o. [id number]  Chief Complaint  Patient presents with  . Knee Pain    bilateral knee pain    HPI  Alison [name] is a 75 y.o. female who has chronic right knee pain that is getting worse.  She has feeling like it will give way but it does not. She has more pain and swelling and popping.  She has no redness.  She has noticed more of a knock knee deformity of the right knee.  She has no new trauma.  Rest, heat, ice, rubs do not help.  She tried some Advil with some slight help.  She wants to talk about a total knee.  I will have Dr. Aline Brochure see her about this. HPI  Body mass index is 33.81 kg/m.  ROS  Review of Systems  HENT: Negative for congestion.   Respiratory: Negative for cough and shortness of breath.   Cardiovascular: Negative for chest pain and leg swelling.  Endocrine: Positive for cold intolerance.  Musculoskeletal: Positive for joint swelling.  Allergic/Immunologic: Positive for environmental allergies.  Psychiatric/Behavioral: The patient is nervous/anxious.     Past Medical History:  Diagnosis Date  . Anxiety   . Arthritis   . Breast cancer ([facility])    X2  . Cervical dysplasia   . GERD (gastroesophageal reflux disease)    takes otc meds  . Glaucoma   . Hyperlipidemia   . Hypertension   . Lichen sclerosus   . Shingles 2012    Past Surgical History:  Procedure Laterality Date  . BREAST SURGERY     Lumpectomy right and left  . GYNECOLOGIC CRYOSURGERY    . HYSTEROSCOPY    . PORT-A-CATH REMOVAL Left 01/31/2014   Procedure: REMOVAL PORT-A-CATH;  Surgeon: Autumn Messing III, MD;  Location: [facility];  Service: General;  Laterality: Left;  . TUBAL LIGATION      Family History  Problem Relation Age of Onset  . Hypertension Mother   . Cancer Mother     colon  . Colon cancer Mother   . Diabetes Sister   . Heart disease Father   . Breast cancer Maternal Aunt 50    Social History Social History  Substance  Use Topics  . Smoking status: Never Smoker  . Smokeless tobacco: Never Used  . Alcohol use Yes     Comment: Rare    No Known Allergies  Current Outpatient Prescriptions  Medication Sig Dispense Refill  . anastrozole (ARIMIDEX) 1 MG tablet Take 1 tablet (1 mg total) by mouth daily. 90 tablet 3  . aspirin 81 MG tablet Take 81 mg by mouth daily.      . Calcium Carbonate-Vitamin D (CALCIUM + D PO) Take 2 capsules by mouth daily.     . clobetasol cream (TEMOVATE) 0.05 % Apply nightly as needed for itching 30 g 2  . escitalopram (LEXAPRO) 10 MG tablet Take 10 mg by mouth daily.    Marland Kitchen gabapentin (NEURONTIN) 300 MG capsule One tab PO qHS for a week, then BID for a week, then TID. May double weekly to a max of 3,600mg /day 60 capsule 3  . KLOR-CON M20 20 MEQ tablet Take 20 mEq by mouth 2 (two) times daily.    Marland Kitchen losartan-hydrochlorothiazide (HYZAAR) 100-25 MG per tablet Take 1 tablet by mouth daily.     . meloxicam (MOBIC) 15 MG tablet TAKE ONE TABLET BY MOUTH ONCE DAILY    . Omega-3 Fatty Acids (FISH OIL PO) Take  1 capsule by mouth daily.     . rosuvastatin (CRESTOR) 10 MG tablet Take 10 mg by mouth daily.      . travoprost, benzalkonium, (TRAVATAN) 0.004 % ophthalmic solution Place 1 drop into both eyes at bedtime.     Marland Kitchen HYDROcodone-acetaminophen (NORCO/VICODIN) 5-325 MG tablet Take 1 tablet by mouth every 4 (four) hours as needed for moderate pain (Must last 14 days.Do not take and drive a car or use machinery.). 56 tablet 0  . naproxen (NAPROSYN) 500 MG tablet Take 1 tablet (500 mg total) by mouth 2 (two) times daily with a meal. 60 tablet 5  . omeprazole (PRILOSEC) 20 MG capsule Take 20 mg by mouth.     No current facility-administered medications for this visit.    Facility-Administered Medications Ordered in Other Visits  Medication Dose Route Frequency Provider Last Rate Last Dose  . sodium chloride 0.9 % injection 10 mL  10 mL Intravenous PRN Lennis Marion Downer, MD   10 mL at 02/27/12  1132     Physical Exam  Blood pressure 136/63, pulse 69, temperature (!) 96.8 F (36 C), height 5\' 4"  (1.626 m), weight 197 lb (89.4 kg), last menstrual period 01/25/2004.  Constitutional: overall normal hygiene, normal nutrition, well developed, normal grooming, normal body habitus. Assistive device:none  Musculoskeletal: gait and station Limp right, muscle tone and strength are normal, no tremors or atrophy is present.  .  Neurological: coordination overall normal.  Deep tendon reflex/nerve stretch intact.  Sensation normal.  Cranial nerves II-XII intact.   Skin:   normal overall no scars, lesions, ulcers or rashes. No psoriasis.  Psychiatric: Alert and oriented x 3.  Recent memory intact, remote memory unclear.  Normal mood and affect. Well groomed.  Good eye contact.  Cardiovascular: overall no swelling, no varicosities, no edema bilaterally, normal temperatures of the legs and arms, no clubbing, cyanosis and good capillary refill.  Lymphatic: palpation is normal.  The right lower extremity is examined:  Inspection:  Thigh:  Non-tender and no defects  Knee has swelling 1+ effusion.                        Joint tenderness is present                        Patient is tender over the lateral joint line  Lower Leg:  Has normal appearance and no tenderness or defects  Ankle:  Non-tender and no defects  Foot:  Non-tender and no defects Range of Motion:  Knee:  Range of motion is: 0-105                        Crepitus is  present  Ankle:  Range of motion is normal. Strength and Tone:  The right lower extremity has normal strength and tone. Stability:  Knee:  The knee is stable.  Ankle:  The ankle is stable.  X-rays were done of the right knee, reported separately.  PROCEDURE NOTE:  The patient requests injections of the right knee , verbal consent was obtained.  The right knee was prepped appropriately after time out was performed.   Sterile technique was observed and  injection of 1 cc of Depo-Medrol 40 mg with several cc's of plain xylocaine. Anesthesia was provided by ethyl chloride and a 20-gauge needle was used to inject the knee area. The injection was tolerated well.  A band aid dressing  was applied.  The patient was advised to apply ice later today and tomorrow to the injection sight as needed.     The patient has been educated about the nature of the problem(s) and counseled on treatment options.  The patient appeared to understand what I have discussed and is in agreement with it.  Encounter Diagnosis  Name Primary?  . Right knee pain Yes    PLAN Call if any problems.  Precautions discussed.  Continue current medications.   Return to clinic to see Dr. Aline Brochure for talk about a total knee  Electronically Signed Sanjuana Kava, MD 8/29/20174:20 PM

## 2015-09-23 ENCOUNTER — Telehealth: Payer: Self-pay | Admitting: Orthopedic Surgery

## 2015-09-24 ENCOUNTER — Other Ambulatory Visit: Payer: Self-pay

## 2015-09-24 DIAGNOSIS — M7062 Trochanteric bursitis, left hip: Secondary | ICD-10-CM

## 2015-09-24 MED ORDER — GABAPENTIN 300 MG PO CAPS: ORAL_CAPSULE | ORAL | 3 refills | Status: DC

## 2015-09-30 ENCOUNTER — Ambulatory Visit: Payer: Medicare HMO | Admitting: Orthopedic Surgery

## 2015-10-12 ENCOUNTER — Encounter: Payer: Self-pay | Admitting: Orthopedic Surgery

## 2015-12-31 NOTE — Progress Notes (Signed)
[place], MD [place] [address]  DIAGNOSIS: Malignant neoplasm of breast (female), Bilateral    ONCOLOGY HISTORY  1. 1998 left T2 with micrometastatic involvement of sentinel node, treated with lumpectomy and axillary node evaluation, chemo/RT/tamoxifen (only for a few years).   2. Spring 2010 right T1bN2 (6 of 23 nodes involved) invasive ductal ER/PR + and HER 2 - treated with  lumpectomy + axillary nodes, adjuvant adria/cytoxan/taxol, radiation and aromatase inhibitor (on now).  CURRENT THERAPY: Arimidex 1 mg daily since November 2010.   INTERVAL HISTORY: Alison [name] 75 y.o. female  history of bilateral breast carcinoma, currently on adjuvant Arimidex is here for follow-up. She has been doing well. She denies issues with taking Arimidex. She notes recent weight gain with the holidays. She also reports fatigue. She complains of right knee problems which limit her physical activity. She was found to have a bulging disc in her back which also limits her activity. She was given a shot and attended physical therapy. She was prescribed gabapentin which made her feel 'wobbly' so she stopped taking it. She takes meloxicam. She admits she doesn't drink enough water. She plans to drink more water. Continued slight numbness/tenderness of the breast after surgery.   MEDICAL HISTORY: Past Medical History:  Diagnosis Date  . Anxiety   . Arthritis   . Breast cancer (HCC)    X2  . Cervical dysplasia   . GERD (gastroesophageal reflux disease)    takes otc meds  . Glaucoma   . Hyperlipidemia   . Hypertension   . Lichen sclerosus   . Shingles 2012      ALLERGIES:  has No Known Allergies.  MEDICATIONS: has a current medication list which includes the following prescription(s): anastrozole, aspirin, calcium citrate-vitamin d, clobetasol cream, escitalopram, gabapentin, hydrocodone-acetaminophen, klor-con m20,  losartan-hydrochlorothiazide, meloxicam, naproxen, omega-3 fatty acids, rosuvastatin, travoprost (benzalkonium), and omeprazole, and the following Facility-Administered Medications: sodium chloride.  SURGICAL HISTORY:  Past Surgical History:  Procedure Laterality Date  . BREAST SURGERY     Lumpectomy right and left  . GYNECOLOGIC CRYOSURGERY    . HYSTEROSCOPY    . PORT-A-CATH REMOVAL Left 01/31/2014   Procedure: REMOVAL PORT-A-CATH;  Surgeon: Autumn Messing III, MD;  Location: [facility];  Service: General;  Laterality: Left;  . TUBAL LIGATION      REVIEW OF SYSTEMS:   Constitutional: Denies fevers, chills or abnormal weight loss            Eyes: Denies blurriness of vision Ears, nose, mouth, throat, and face: Denies mucositis or sore throat Respiratory: Denies cough, dyspnea or wheezes Cardiovascular: Denies palpitation, chest discomfort or lower extremity swelling Gastrointestinal:  Denies nausea, heartburn or change in bowel habits Skin: Denies abnormal skin rashes Musculoskeletal: (+) joint pain Lymphatics: Denies new lymphadenopathy or easy bruising Neurological:Denies numbness, tingling or new weaknesses Behavioral/Psych: Mood is stable, no new changes  All other systems were reviewed with the patient and are negative.  PHYSICAL EXAMINATION: ECOG PERFORMANCE STATUS: 1  Blood pressure (!) 142/93, pulse 78, temperature 98.3 F (36.8 C), temperature source Oral, resp. rate 18, height '5\' 4"'  (1.626 m), weight 189 lb 11.2 oz (86 kg), last menstrual period 01/25/2004, SpO2 98 %.  GENERAL:alert, no distress and comfortable (+) fatigue SKIN: skin color, texture, turgor are normal, no rashes or significant lesions; L Portacath-without erythema or tenderness, flushed today EYES: normal, Conjunctiva are pink and non-injected, sclera clear OROPHARYNX:no exudate, no erythema and  lips, buccal mucosa, and tongue normal  NECK: supple, thyroid normal size, non-tender, without nodularity LYMPH:  no  palpable lymphadenopathy in the cervical, axillary or supraclavicular LUNGS: clear to auscultation and percussion with normal breathing effort HEART: regular rate & rhythm and no murmurs and no lower extremity edema ABDOMEN:abdomen soft, non-tender and normal bowel sounds Musculoskeletal:no cyanosis of digits and no clubbing  BREASTS: bilateral lumpectomy scars well healed, no dominant mass, no skin or nipple findings of concern, axillae and UE not remarkable.  NEURO: alert & oriented x 3 with fluent speech, no focal motor/sensory deficits  Labs:  CBC Latest Ref Rng & Units 01/01/2016 01/01/2015 01/29/2014  WBC 3.9 - 10.3 10e3/uL 4.2 4.0 4.8  Hemoglobin 11.6 - 15.9 g/dL 13.0 12.3 12.5  Hematocrit 34.8 - 46.6 % 39.6 37.1 38.1  Platelets 145 - 400 10e3/uL 246 211 244    CMP Latest Ref Rng & Units 01/01/2016 01/01/2015 01/29/2014  Glucose 70 - 140 mg/dl 96 122 100(H)  BUN 7.0 - 26.0 mg/dL 16.1 18.6 14  Creatinine 0.6 - 1.1 mg/dL 0.8 0.9 0.77  Sodium 136 - 145 mEq/L 141 141 141  Potassium 3.5 - 5.1 mEq/L 4.0 3.5 3.4(L)  Chloride 96 - 112 mEq/L - - 101  CO2 22 - 29 mEq/L '28 26 30  ' Calcium 8.4 - 10.4 mg/dL 9.7 9.8 9.8  Total Protein 6.4 - 8.3 g/dL 7.4 7.5 -  Total Bilirubin 0.20 - 1.20 mg/dL 1.75(H) 1.57(H) -  Alkaline Phos 40 - 150 U/L 73 73 -  AST 5 - 34 U/L 24 22 -  ALT 0 - 55 U/L 22 17 -      RADIOGRAPHIC STUDIES:  Diagnostic mammogram 04/15/2015 RECOMMENDATION: Bilateral screening mammogram in 1 year.  I have discussed the findings and recommendations with the patient. Results were also provided in writing at the conclusion of the visit. If applicable, a reminder letter will be sent to the patient regarding the next appointment.  Bone density 04/15/2015 ASSESSMENT: The BMD measured at Femur Neck Right is 0.909 g/cm2 with a T-score of -0.9. This patient is considered normal according to [facility]) criteria. L-2, L-4 were excluded due to degenerative  changes. There has been a statistically significant decrease in BMD of Left hip since prior exam dated 02/24/2011.  ASSESSMENT: Alison [name] 75 y.o. female with a history of Malignant neoplasm of breast (female), currently on adjuvant hormonal therapy.   1. Right upper outer quadrant breast invasive ductal carcinoma, stage IIIA, ER and PR positive, HER-2 negative -She has been on adjuvant anastrozole for 7 years now (started in 2010). The reason the clinical trial data demonstrated significant higher rate of disease-free survival and a low incidence of contralateral breast cancer with extended 10 year aromatase inhibitor (NEJM 2016, 375:209). Giving her advanced stage, I recommend her to continue anastrozole for total of 10 years. She is tolerating anastrozole well overall, has mild side effects which is tolerable, agrees to continue. -Next mammogram is due in March 2018. I ordered the scans today.  -I encouraged her to continue healthy diet and exercise regularly -I reviewed her anastrozole today. Refill prescription was given to her.  -Labs reviewed, her CBC was normal, her total bilirubin is slowly elevated, liver enzymes are normal, I recommend her to continue follow-up with her primary care physician. -She is clinically doing well, physical exam was unremarkable, no clinical concern of recurrence.  2. HTN, arthritis -She'll continue follow-up with her primary care physician.  Follow-up:  -  One year with labs CBC and CMP.  -Anastrozole refilled for one year. -Repeat mammogram in March 2018. I ordered the scans today.  All questions were answered. The patient knows to call the clinic with any problems, questions or concerns. We can certainly see the patient much sooner if necessary.  I spent 10 minutes counseling the patient face to face. The total time spent in the appointment was 15 minutes.  This document serves as a record of services personally performed by Truitt Merle, MD. It was  created on her behalf by Arlyce Harman, a trained medical scribe. The creation of this record is based on the scribe's personal observations and the provider's statements to them. This document has been checked and approved by the attending provider.    Truitt Merle, MD 01/01/16 5:52 PM

## 2016-01-01 ENCOUNTER — Other Ambulatory Visit (HOSPITAL_BASED_OUTPATIENT_CLINIC_OR_DEPARTMENT_OTHER): Payer: Medicare HMO

## 2016-01-01 ENCOUNTER — Telehealth: Payer: Self-pay | Admitting: Hematology

## 2016-01-01 ENCOUNTER — Ambulatory Visit (HOSPITAL_BASED_OUTPATIENT_CLINIC_OR_DEPARTMENT_OTHER): Payer: Medicare HMO | Admitting: Hematology

## 2016-01-01 ENCOUNTER — Encounter: Payer: Self-pay | Admitting: Hematology

## 2016-01-01 VITALS — BP 142/93 | HR 78 | Temp 98.3°F | Resp 18 | Ht 64.0 in | Wt 189.7 lb

## 2016-01-01 DIAGNOSIS — Z853 Personal history of malignant neoplasm of breast: Secondary | ICD-10-CM

## 2016-01-01 DIAGNOSIS — C50919 Malignant neoplasm of unspecified site of unspecified female breast: Secondary | ICD-10-CM

## 2016-01-01 DIAGNOSIS — Z170 Estrogen receptor positive status [ER+]: Secondary | ICD-10-CM | POA: Diagnosis not present

## 2016-01-01 DIAGNOSIS — C50411 Malignant neoplasm of upper-outer quadrant of right female breast: Secondary | ICD-10-CM

## 2016-01-01 DIAGNOSIS — I10 Essential (primary) hypertension: Secondary | ICD-10-CM

## 2016-01-01 LAB — COMPREHENSIVE METABOLIC PANEL
ALBUMIN: 3.7 g/dL (ref 3.5–5.0)
ALK PHOS: 73 U/L (ref 40–150)
ALT: 22 U/L (ref 0–55)
ANION GAP: 9 meq/L (ref 3–11)
AST: 24 U/L (ref 5–34)
BUN: 16.1 mg/dL (ref 7.0–26.0)
CO2: 28 mEq/L (ref 22–29)
Calcium: 9.7 mg/dL (ref 8.4–10.4)
Chloride: 104 mEq/L (ref 98–109)
Creatinine: 0.8 mg/dL (ref 0.6–1.1)
EGFR: 79 mL/min/{1.73_m2} — AB (ref >90)
GLUCOSE: 96 mg/dL (ref 70–140)
POTASSIUM: 4 meq/L (ref 3.5–5.1)
SODIUM: 141 meq/L (ref 136–145)
Total Bilirubin: 1.75 mg/dL — ABNORMAL HIGH (ref 0.20–1.20)
Total Protein: 7.4 g/dL (ref 6.4–8.3)

## 2016-01-01 LAB — CBC WITH DIFFERENTIAL/PLATELET
BASO%: 0.8 % (ref 0.0–2.0)
BASOS ABS: 0 10*3/uL (ref 0.0–0.1)
EOS ABS: 0 10*3/uL (ref 0.0–0.5)
EOS%: 0.9 % (ref 0.0–7.0)
HCT: 39.6 % (ref 34.8–46.6)
HEMOGLOBIN: 13 g/dL (ref 11.6–15.9)
LYMPH%: 18.7 % (ref 14.0–49.7)
MCH: 32 pg (ref 25.1–34.0)
MCHC: 32.9 g/dL (ref 31.5–36.0)
MCV: 97.3 fL (ref 79.5–101.0)
MONO#: 0.5 10*3/uL (ref 0.1–0.9)
MONO%: 12.6 % (ref 0.0–14.0)
NEUT#: 2.8 10*3/uL (ref 1.5–6.5)
NEUT%: 67 % (ref 38.4–76.8)
PLATELETS: 246 10*3/uL (ref 145–400)
RBC: 4.07 10*6/uL (ref 3.70–5.45)
RDW: 13.6 % (ref 11.2–14.5)
WBC: 4.2 10*3/uL (ref 3.9–10.3)
lymph#: 0.8 10*3/uL — ABNORMAL LOW (ref 0.9–3.3)

## 2016-01-01 MED ORDER — ANASTROZOLE 1 MG PO TABS: 1.0000 mg | ORAL_TABLET | Freq: Every day | ORAL | 3 refills | Status: DC

## 2016-01-01 NOTE — Telephone Encounter (Signed)
Gave patient avs report and appointments for December 2018. Not able to schedule annual mammo due to patient not due until after 04/15/16 and order expires 03/31/16.   Left message for desk nurse re having expiration date changed. Patient will call [facility] in a few days to schedule mammo - patient/[facility] aware.

## 2016-01-12 ENCOUNTER — Telehealth: Payer: Self-pay | Admitting: Hematology

## 2016-01-12 NOTE — Telephone Encounter (Signed)
Mailed records to [facility]/ risk adjustment °

## 2016-04-15 ENCOUNTER — Ambulatory Visit
Admission: RE | Admit: 2016-04-15 | Discharge: 2016-04-15 | Disposition: A | Discharge status: Home / Self Care | Payer: Medicare HMO | Source: Ambulatory Visit | Attending: Hematology | Admitting: Hematology

## 2016-04-15 DIAGNOSIS — Z853 Personal history of malignant neoplasm of breast: Secondary | ICD-10-CM

## 2016-04-15 DIAGNOSIS — Z170 Estrogen receptor positive status [ER+]: Principal | ICD-10-CM

## 2016-04-15 DIAGNOSIS — C50411 Malignant neoplasm of upper-outer quadrant of right female breast: Secondary | ICD-10-CM

## 2016-04-15 HISTORY — DX: Personal history of antineoplastic chemotherapy: Z92.21

## 2016-04-15 HISTORY — DX: Personal history of irradiation: Z92.3

## 2016-06-27 DIAGNOSIS — R799 Abnormal finding of blood chemistry, unspecified: Secondary | ICD-10-CM | POA: Diagnosis not present

## 2016-06-27 DIAGNOSIS — I10 Essential (primary) hypertension: Secondary | ICD-10-CM | POA: Diagnosis not present

## 2016-06-27 DIAGNOSIS — F3342 Major depressive disorder, recurrent, in full remission: Secondary | ICD-10-CM | POA: Diagnosis not present

## 2016-06-27 DIAGNOSIS — M1990 Unspecified osteoarthritis, unspecified site: Secondary | ICD-10-CM | POA: Diagnosis not present

## 2016-06-27 DIAGNOSIS — E785 Hyperlipidemia, unspecified: Secondary | ICD-10-CM | POA: Diagnosis not present

## 2016-06-27 DIAGNOSIS — R69 Illness, unspecified: Secondary | ICD-10-CM | POA: Diagnosis not present

## 2016-10-25 DIAGNOSIS — Z23 Encounter for immunization: Secondary | ICD-10-CM | POA: Diagnosis not present

## 2016-12-29 NOTE — Progress Notes (Addendum)
Alison [name] OFFICE PROGRESS NOTE  [name], MD [place] [address]  DIAGNOSIS: Malignant neoplasm of breast (female), Bilateral    ONCOLOGY HISTORY  1. 1998 left T2 with micrometastatic involvement of sentinel node, treated with lumpectomy and axillary node evaluation, chemo/RT/tamoxifen (only for a few years).   2. Spring 2010 right T1bN2 (6 of 23 nodes involved) invasive ductal ER/PR + and HER 2 - treated with  lumpectomy + axillary nodes, adjuvant adria/cytoxan/taxol, radiation and aromatase inhibitor (on now).  CURRENT THERAPY: Arimidex 1 mg daily since November 2010, complete in 01/2017.   INTERVAL HISTORY:  Alison [name] 76 y.o. female  history of bilateral breast carcinoma, currently on adjuvant Arimidex is here for follow-up. She was last seen by me 1 year ago. She presents to the clinic today noting she is doing well. She notes her arthritis in her right knee is worse. She is considering a knee replacement. She will occasionally walk with a cane as needed. She reports to sometimes having rib soreness of her right side. She had a mammogram in 03/2016 and she plans to see her Gyn next year.    MEDICAL HISTORY: Past Medical History:  Diagnosis Date  . Anxiety   . Arthritis   . Breast cancer (HCC)    X2  . Cervical dysplasia   . GERD (gastroesophageal reflux disease)    takes otc meds  . Glaucoma   . Hyperlipidemia   . Hypertension   . Lichen sclerosus   . Personal history of chemotherapy 2010  . Personal history of radiation therapy 1999  . Shingles 2012      ALLERGIES:  has No Known Allergies.  MEDICATIONS: has a current medication list which includes the following prescription(s): anastrozole, aspirin, calcium citrate-vitamin d, clobetasol cream, escitalopram, gabapentin, hydrocodone-acetaminophen, klor-con m20, losartan-hydrochlorothiazide, meloxicam, naproxen, omega-3 fatty acids, rosuvastatin, travoprost (benzalkonium), and  omeprazole, and the following Facility-Administered Medications: sodium chloride.  SURGICAL HISTORY:  Past Surgical History:  Procedure Laterality Date  . BREAST BIOPSY Right 2010  . BREAST LUMPECTOMY Right 2010  . BREAST SURGERY     Lumpectomy right and left  . GYNECOLOGIC CRYOSURGERY    . HYSTEROSCOPY    . PORT-A-CATH REMOVAL Left 01/31/2014   Procedure: REMOVAL PORT-A-CATH;  Surgeon: Autumn Messing III, MD;  Location: [facility];  Service: General;  Laterality: Left;  . TUBAL LIGATION      REVIEW OF SYSTEMS:   Constitutional: Denies fevers, chills or abnormal weight loss            Eyes: Denies blurriness of vision Ears, nose, mouth, throat, and face: Denies mucositis or sore throat Respiratory: Denies cough, dyspnea or wheezes Cardiovascular: Denies palpitation, chest discomfort or lower extremity swelling Gastrointestinal:  Denies nausea, heartburn or change in bowel habits Skin: Denies abnormal skin rashes Musculoskeletal: (+) arthritis in right knee worsened, (+) right flank soreness occasionally  Lymphatics: Denies new lymphadenopathy or easy bruising Neurological:Denies numbness, tingling or new weaknesses Behavioral/Psych: Mood is stable, no new changes  All other systems were reviewed with the patient and are negative.  PHYSICAL EXAMINATION: ECOG PERFORMANCE STATUS: 1  Blood pressure (!) 162/71, pulse 82, temperature 98.3 F (36.8 C), temperature source Oral, resp. rate 18, height _0  (1.626 m), weight 202 lb 14.4 oz (92 kg), last menstrual period 01/25/2004, SpO2 98 %.   GENERAL:alert, no distress and comfortable SKIN: skin color, texture, turgor are normal, no rashes or significant lesions; L Portacath-without erythema or tenderness, flushed today  EYES: normal, Conjunctiva are pink and non-injected, sclera clear OROPHARYNX:no exudate, no erythema and lips, buccal mucosa, and tongue normal  NECK: supple, thyroid normal size, non-tender, without nodularity LYMPH:  no  palpable lymphadenopathy in the cervical, axillary or supraclavicular LUNGS: clear to auscultation and percussion with normal breathing effort HEART: regular rate & rhythm and no murmurs and no lower extremity edema ABDOMEN:abdomen soft, non-tender and normal bowel sounds Musculoskeletal:no cyanosis of digits and no clubbing  BREASTS: bilateral lumpectomy scars well healed, no dominant mass, no skin or nipple findings of concern, axillae and UE not remarkable.  NEURO: alert & oriented x 3 with fluent speech, no focal motor/sensory deficits  Labs:  CBC Latest Ref Rng & Units 12/30/2016 01/01/2016 01/01/2015  WBC 3.9 - 10.3 10e3/uL 3.9 4.2 4.0  Hemoglobin 11.6 - 15.9 g/dL 11.8 13.0 12.3  Hematocrit 34.8 - 46.6 % 36.0 39.6 37.1  Platelets 145 - 400 10e3/uL 211 246 211    CMP Latest Ref Rng & Units 12/30/2016 01/01/2016 01/01/2015  Glucose 70 - 140 mg/dl 90 96 122  BUN 7.0 - 26.0 mg/dL 14.9 16.1 18.6  Creatinine 0.6 - 1.1 mg/dL 0.9 0.8 0.9  Sodium 136 - 145 mEq/L 142 141 141  Potassium 3.5 - 5.1 mEq/L 3.9 4.0 3.5  Chloride 96 - 112 mEq/L - - -  CO2 22 - 29 mEq/L _0 Calcium 8.4 - 10.4 mg/dL 9.2 9.7 9.8  Total Protein 6.4 - 8.3 g/dL 7.0 7.4 7.5  Total Bilirubin 0.20 - 1.20 mg/dL 1.34(H) 1.75(H) 1.57(H)  Alkaline Phos 40 - 150 U/L 83 73 73  AST 5 - 34 U/L _1 ALT 0 - 55 U/L _2 RADIOGRAPHIC STUDIES:  Diagnostic Mammogram 04/15/16 IMPRESSION: No mammographic evidence of malignancy. A result letter of this screening mammogram will be mailed directly to the patient. RECOMMENDATION: Screening mammogram in one year.   Diagnostic mammogram 04/15/2015 RECOMMENDATION: Bilateral screening mammogram in 1 year.  I have discussed the findings and recommendations with the patient. Results were also provided in writing at the conclusion of the visit. If applicable, a reminder letter will be sent to the patient regarding the next appointment.  Bone density  04/15/2015 ASSESSMENT: The BMD measured at Femur Neck Right is 0.909 g/cm2 with a T-score of -0.9. This patient is considered normal according to [facility]) criteria. L-2, L-4 were excluded due to degenerative changes. There has been a statistically significant decrease in BMD of Left hip since prior exam dated 02/24/2011.  ASSESSMENT: Alison [name] 76 y.o. female with a history of Malignant neoplasm of breast (female), currently on adjuvant hormonal therapy.   1. Right upper outer quadrant breast invasive ductal carcinoma, stage IIIA, ER and PR positive, HER-2 negative -She has been on adjuvant anastrozole since 11/2008 and will continue for 7 years. The reason the clinical trial data demonstrated significant higher rate of disease-free survival and a low incidence of contralateral breast cancer with extended 10 year aromatase inhibitor (NEJM 2016, 375:209). Giving her advanced stage, I recommend her to continue anastrozole for total of 10 years. She is tolerating anastrozole well overall, has mild side effects which is tolerable, agrees to continue. -I encouraged her to continue healthy diet and exercise regularly -She has been on anastrozole for about 8 years. Given her moderate risk disease the benefit is much smaller past 8 years. She can stop anastrozole after current bottle is complete.  -Labs reviewed, her CBC  was normal, her total bilirubin is slightly elevated, liver enzymes are normal, I recommend her to continue follow-up with her primary care physician. -She is clinically doing well. Her physical exam and her 03/2016 mammogram were unremarkable. There is no clinical concern for recurrence. -Next mammogram and bone density scan in 03/2017 -I suggest she sees her GYN 5-6 months apart from seeing me. -F/u in 1 year    2. HTN, arthritis -She'll continue follow-up with her primary care physician.   Follow-up:  -One year with labs CBC and CMP.  -Complete  anastrozole after current bottle, then she will stop  -mammogram and DEXA in March 2019 at [facility]   All questions were answered. The patient knows to call the clinic with any problems, questions or concerns. We can certainly see the patient much sooner if necessary.  I spent 15 minutes counseling the patient face to face. The total time spent in the appointment was 20 minutes.  This document serves as a record of services personally performed by Truitt Merle, MD. It was created on her behalf by Joslyn Devon, a trained medical scribe. The creation of this record is based on the scribe's personal observations and the provider's statements to them.    I have reviewed the above documentation for accuracy and completeness, and I agree with the above.   Truitt Merle, MD 12/30/2016

## 2016-12-30 ENCOUNTER — Ambulatory Visit (HOSPITAL_BASED_OUTPATIENT_CLINIC_OR_DEPARTMENT_OTHER): Payer: Medicare HMO | Admitting: Hematology

## 2016-12-30 ENCOUNTER — Telehealth: Payer: Self-pay | Admitting: Hematology

## 2016-12-30 ENCOUNTER — Other Ambulatory Visit: Payer: Medicare HMO

## 2016-12-30 ENCOUNTER — Encounter: Payer: Self-pay | Admitting: Hematology

## 2016-12-30 VITALS — BP 162/71 | HR 82 | Temp 98.3°F | Resp 18 | Ht 64.0 in | Wt 202.9 lb

## 2016-12-30 DIAGNOSIS — Z853 Personal history of malignant neoplasm of breast: Secondary | ICD-10-CM | POA: Diagnosis not present

## 2016-12-30 DIAGNOSIS — Z170 Estrogen receptor positive status [ER+]: Secondary | ICD-10-CM

## 2016-12-30 DIAGNOSIS — C50411 Malignant neoplasm of upper-outer quadrant of right female breast: Secondary | ICD-10-CM

## 2016-12-30 DIAGNOSIS — C50919 Malignant neoplasm of unspecified site of unspecified female breast: Secondary | ICD-10-CM

## 2016-12-30 LAB — CBC WITH DIFFERENTIAL/PLATELET
BASO%: 1 % (ref 0.0–2.0)
BASOS ABS: 0 10*3/uL (ref 0.0–0.1)
EOS ABS: 0.2 10*3/uL (ref 0.0–0.5)
EOS%: 5.8 % (ref 0.0–7.0)
HEMATOCRIT: 36 % (ref 34.8–46.6)
HEMOGLOBIN: 11.8 g/dL (ref 11.6–15.9)
LYMPH#: 0.9 10*3/uL (ref 0.9–3.3)
LYMPH%: 23.1 % (ref 14.0–49.7)
MCH: 32.1 pg (ref 25.1–34.0)
MCHC: 32.8 g/dL (ref 31.5–36.0)
MCV: 97.8 fL (ref 79.5–101.0)
MONO#: 0.5 10*3/uL (ref 0.1–0.9)
MONO%: 11.7 % (ref 0.0–14.0)
NEUT%: 58.4 % (ref 38.4–76.8)
NEUTROS ABS: 2.3 10*3/uL (ref 1.5–6.5)
PLATELETS: 211 10*3/uL (ref 145–400)
RBC: 3.68 10*6/uL — ABNORMAL LOW (ref 3.70–5.45)
RDW: 13.7 % (ref 11.2–14.5)
WBC: 3.9 10*3/uL (ref 3.9–10.3)

## 2016-12-30 LAB — COMPREHENSIVE METABOLIC PANEL
ALT: 26 U/L (ref 0–55)
ANION GAP: 8 meq/L (ref 3–11)
AST: 29 U/L (ref 5–34)
Albumin: 3.6 g/dL (ref 3.5–5.0)
Alkaline Phosphatase: 83 U/L (ref 40–150)
BUN: 14.9 mg/dL (ref 7.0–26.0)
CALCIUM: 9.2 mg/dL (ref 8.4–10.4)
CHLORIDE: 107 meq/L (ref 98–109)
CO2: 27 mEq/L (ref 22–29)
CREATININE: 0.9 mg/dL (ref 0.6–1.1)
EGFR: >60 mL/min/{1.73_m2} (ref >60)
Glucose: 90 mg/dl (ref 70–140)
POTASSIUM: 3.9 meq/L (ref 3.5–5.1)
Sodium: 142 mEq/L (ref 136–145)
Total Bilirubin: 1.34 mg/dL — ABNORMAL HIGH (ref 0.20–1.20)
Total Protein: 7 g/dL (ref 6.4–8.3)

## 2016-12-30 NOTE — Telephone Encounter (Signed)
Scheduled appt per 12/7 los - Gave patient AVS and calender per los - lab and f/u in one year,.

## 2017-01-27 DIAGNOSIS — E785 Hyperlipidemia, unspecified: Secondary | ICD-10-CM | POA: Diagnosis not present

## 2017-01-27 DIAGNOSIS — I10 Essential (primary) hypertension: Secondary | ICD-10-CM | POA: Diagnosis not present

## 2017-01-27 DIAGNOSIS — J209 Acute bronchitis, unspecified: Secondary | ICD-10-CM | POA: Diagnosis not present

## 2017-01-27 DIAGNOSIS — R69 Illness, unspecified: Secondary | ICD-10-CM | POA: Diagnosis not present

## 2017-03-07 ENCOUNTER — Other Ambulatory Visit: Payer: Self-pay | Admitting: Hematology

## 2017-03-07 DIAGNOSIS — Z139 Encounter for screening, unspecified: Secondary | ICD-10-CM

## 2017-04-09 ENCOUNTER — Encounter: Payer: Self-pay | Admitting: Gastroenterology

## 2017-04-17 ENCOUNTER — Ambulatory Visit
Admission: RE | Admit: 2017-04-17 | Discharge: 2017-04-17 | Disposition: A | Discharge status: Home / Self Care | Payer: Medicare HMO | Source: Ambulatory Visit | Attending: Hematology | Admitting: Hematology

## 2017-04-17 DIAGNOSIS — Z1231 Encounter for screening mammogram for malignant neoplasm of breast: Secondary | ICD-10-CM | POA: Diagnosis not present

## 2017-04-17 DIAGNOSIS — Z139 Encounter for screening, unspecified: Secondary | ICD-10-CM

## 2017-04-22 DIAGNOSIS — Z7982 Long term (current) use of aspirin: Secondary | ICD-10-CM | POA: Diagnosis not present

## 2017-04-22 DIAGNOSIS — K5900 Constipation, unspecified: Secondary | ICD-10-CM | POA: Diagnosis not present

## 2017-04-22 DIAGNOSIS — E785 Hyperlipidemia, unspecified: Secondary | ICD-10-CM | POA: Diagnosis not present

## 2017-04-22 DIAGNOSIS — Z8249 Family history of ischemic heart disease and other diseases of the circulatory system: Secondary | ICD-10-CM | POA: Diagnosis not present

## 2017-04-22 DIAGNOSIS — G8929 Other chronic pain: Secondary | ICD-10-CM | POA: Diagnosis not present

## 2017-04-22 DIAGNOSIS — Z791 Long term (current) use of non-steroidal anti-inflammatories (NSAID): Secondary | ICD-10-CM | POA: Diagnosis not present

## 2017-04-22 DIAGNOSIS — M179 Osteoarthritis of knee, unspecified: Secondary | ICD-10-CM | POA: Diagnosis not present

## 2017-04-22 DIAGNOSIS — Z809 Family history of malignant neoplasm, unspecified: Secondary | ICD-10-CM | POA: Diagnosis not present

## 2017-04-22 DIAGNOSIS — I10 Essential (primary) hypertension: Secondary | ICD-10-CM | POA: Diagnosis not present

## 2017-04-22 DIAGNOSIS — R69 Illness, unspecified: Secondary | ICD-10-CM | POA: Diagnosis not present

## 2017-04-28 DIAGNOSIS — H4089 Other specified glaucoma: Secondary | ICD-10-CM | POA: Diagnosis not present

## 2017-04-28 DIAGNOSIS — H2513 Age-related nuclear cataract, bilateral: Secondary | ICD-10-CM | POA: Diagnosis not present

## 2017-04-28 DIAGNOSIS — H4051X1 Glaucoma secondary to other eye disorders, right eye, mild stage: Secondary | ICD-10-CM | POA: Diagnosis not present

## 2017-05-01 ENCOUNTER — Encounter: Payer: Self-pay | Admitting: Gastroenterology

## 2017-05-08 DIAGNOSIS — H4051X1 Glaucoma secondary to other eye disorders, right eye, mild stage: Secondary | ICD-10-CM | POA: Diagnosis not present

## 2017-05-08 DIAGNOSIS — H25811 Combined forms of age-related cataract, right eye: Secondary | ICD-10-CM | POA: Diagnosis not present

## 2017-05-08 DIAGNOSIS — H409 Unspecified glaucoma: Secondary | ICD-10-CM | POA: Diagnosis not present

## 2017-05-08 DIAGNOSIS — H2511 Age-related nuclear cataract, right eye: Secondary | ICD-10-CM | POA: Diagnosis not present

## 2017-05-11 ENCOUNTER — Encounter: Payer: Medicare HMO | Admitting: Gynecology

## 2017-05-31 DIAGNOSIS — C50411 Malignant neoplasm of upper-outer quadrant of right female breast: Secondary | ICD-10-CM | POA: Diagnosis not present

## 2017-05-31 DIAGNOSIS — R69 Illness, unspecified: Secondary | ICD-10-CM | POA: Diagnosis not present

## 2017-05-31 DIAGNOSIS — I10 Essential (primary) hypertension: Secondary | ICD-10-CM | POA: Diagnosis not present

## 2017-05-31 DIAGNOSIS — Z23 Encounter for immunization: Secondary | ICD-10-CM | POA: Diagnosis not present

## 2017-05-31 DIAGNOSIS — E785 Hyperlipidemia, unspecified: Secondary | ICD-10-CM | POA: Diagnosis not present

## 2017-05-31 DIAGNOSIS — Z0000 Encounter for general adult medical examination without abnormal findings: Secondary | ICD-10-CM | POA: Diagnosis not present

## 2017-05-31 DIAGNOSIS — M1990 Unspecified osteoarthritis, unspecified site: Secondary | ICD-10-CM | POA: Diagnosis not present

## 2017-05-31 DIAGNOSIS — F3342 Major depressive disorder, recurrent, in full remission: Secondary | ICD-10-CM | POA: Diagnosis not present

## 2017-05-31 DIAGNOSIS — Z170 Estrogen receptor positive status [ER+]: Secondary | ICD-10-CM | POA: Diagnosis not present

## 2017-05-31 DIAGNOSIS — Z1211 Encounter for screening for malignant neoplasm of colon: Secondary | ICD-10-CM | POA: Diagnosis not present

## 2017-06-22 ENCOUNTER — Ambulatory Visit: Payer: Medicare HMO | Admitting: Gastroenterology

## 2017-06-29 ENCOUNTER — Encounter: Payer: Medicare HMO | Admitting: Gynecology

## 2017-07-12 ENCOUNTER — Encounter: Payer: Self-pay | Admitting: Gynecology

## 2017-07-12 ENCOUNTER — Ambulatory Visit: Payer: Medicare HMO | Admitting: Gynecology

## 2017-07-12 VITALS — BP 134/82 | Ht 63.0 in | Wt 206.0 lb

## 2017-07-12 DIAGNOSIS — N952 Postmenopausal atrophic vaginitis: Secondary | ICD-10-CM

## 2017-07-12 DIAGNOSIS — L900 Lichen sclerosus et atrophicus: Secondary | ICD-10-CM

## 2017-07-12 DIAGNOSIS — Z9189 Other specified personal risk factors, not elsewhere classified: Secondary | ICD-10-CM

## 2017-07-12 DIAGNOSIS — Z01411 Encounter for gynecological examination (general) (routine) with abnormal findings: Secondary | ICD-10-CM

## 2017-07-12 DIAGNOSIS — Z853 Personal history of malignant neoplasm of breast: Secondary | ICD-10-CM

## 2017-07-12 NOTE — Progress Notes (Signed)
    Alison [name] Dec 29, 1940 [id number]        77 y.o.  G3P3003 for breast and pelvic exam.  Doing well without gynecologic complaints.  Past medical history,surgical history, problem list, medications, allergies, family history and social history were all reviewed and documented as reviewed in the EPIC chart.  ROS:  Performed with pertinent positives and negatives included in the history, assessment and plan.   Additional significant findings : None   Exam: Caryn Bee assistant Vitals:   07/12/17 1021  BP: 134/82  Weight: 206 lb (93.4 kg)  Height: 5\' 3"  (1.6 m)   Body mass index is 36.49 kg/m.  General appearance:  Normal affect, orientation and appearance. Skin: Grossly normal HEENT: Without gross lesions.  No cervical or supraclavicular adenopathy. Thyroid normal.  Lungs:  Clear without wheezing, rales or rhonchi Cardiac: RR, without RMG Abdominal:  Soft, nontender, without masses, guarding, rebound, organomegaly or hernia Breasts:  Examined lying and sitting without masses, retractions, discharge or axillary adenopathy.  Well-healed bilateral lumpectomy scars. Pelvic:  Ext, BUS, Vagina: With atrophic changes.  Symmetrical vitiligo type changes from periclitoral hood through perianal region bilaterally.  Consistent with her history of lichen sclerosis  Cervix: With atrophic changes  Uterus: Anteverted, normal size, shape and contour, midline and mobile nontender   Adnexa: Without masses or tenderness    Anus and perineum: Normal   Rectovaginal: Normal sphincter tone without palpated masses or tenderness.    Assessment/Plan:  76 y.o. G22P3003 female for breast and pelvic exam.   1. Postmenopausal/atrophic genital changes.  No significant hot flushes, night sweats or any vaginal bleeding.  She did have some slight bleeding 2 years ago that was felt to be secondary to her scratching with the lichen sclerosus.  I recommended an ultrasound that had time just as a rule out  endometrial issues.  She never followed up for this.  She is never done any bleeding since then.  The pros and cons of surveillance ultrasound now discussed with the patient at this point she declines feeling reassured it was due to scratching and nothing else and understanding the issue of missed pathology.  She knows to call if she does any bleeding. 2. Lichen sclerosis, biopsy-proven.  Using  clobetasol 0.05% cream intermittently as needed with good results.  Has supply but will call when needs more. 3. History of breast cancer with bilateral lumpectomies.  Actively followed by oncology.  Stopped anastrozole this past year.  Exam today NED.  Mammography 03/2017.  Continue with her follow-up with oncology and her annual mammographies. 4. Pap smear 2011.  No Pap smear done today.  No history of abnormal Pap smears.  We both agree to stop screening per current screening guidelines based on age. 5. DEXA 2017 normal.  Followed through her oncology office. 6. Colonoscopy 2014.  Repeat at their recommended interval. 7. Health maintenance.  No routine lab work done as this is done elsewhere.  Follow-up 1 year, sooner as needed.   Anastasio Auerbach MD, 10:51 AM 07/12/2017

## 2017-07-12 NOTE — Patient Instructions (Signed)
Follow-up in 1 year for exam, sooner if any issues or bleeding.

## 2017-07-24 DIAGNOSIS — M791 Myalgia, unspecified site: Secondary | ICD-10-CM | POA: Diagnosis not present

## 2017-07-24 DIAGNOSIS — I10 Essential (primary) hypertension: Secondary | ICD-10-CM | POA: Diagnosis not present

## 2017-07-31 DIAGNOSIS — M7918 Myalgia, other site: Secondary | ICD-10-CM | POA: Diagnosis not present

## 2017-07-31 DIAGNOSIS — I10 Essential (primary) hypertension: Secondary | ICD-10-CM | POA: Diagnosis not present

## 2017-08-21 DIAGNOSIS — M549 Dorsalgia, unspecified: Secondary | ICD-10-CM | POA: Diagnosis not present

## 2017-08-21 DIAGNOSIS — I10 Essential (primary) hypertension: Secondary | ICD-10-CM | POA: Diagnosis not present

## 2017-08-22 ENCOUNTER — Other Ambulatory Visit: Payer: Self-pay | Admitting: Family Medicine

## 2017-08-22 ENCOUNTER — Ambulatory Visit (INDEPENDENT_AMBULATORY_CARE_PROVIDER_SITE_OTHER): Payer: Medicare HMO

## 2017-08-22 DIAGNOSIS — M545 Low back pain: Secondary | ICD-10-CM

## 2017-08-22 DIAGNOSIS — M5136 Other intervertebral disc degeneration, lumbar region: Secondary | ICD-10-CM | POA: Diagnosis not present

## 2017-08-25 DIAGNOSIS — H25812 Combined forms of age-related cataract, left eye: Secondary | ICD-10-CM | POA: Diagnosis not present

## 2017-08-30 ENCOUNTER — Ambulatory Visit: Payer: Medicare HMO | Admitting: Gastroenterology

## 2017-09-04 DIAGNOSIS — Z01818 Encounter for other preprocedural examination: Secondary | ICD-10-CM | POA: Diagnosis not present

## 2017-09-04 DIAGNOSIS — H2512 Age-related nuclear cataract, left eye: Secondary | ICD-10-CM | POA: Diagnosis not present

## 2017-09-04 DIAGNOSIS — H409 Unspecified glaucoma: Secondary | ICD-10-CM | POA: Diagnosis not present

## 2017-09-04 DIAGNOSIS — H4089 Other specified glaucoma: Secondary | ICD-10-CM | POA: Diagnosis not present

## 2017-09-04 DIAGNOSIS — H25812 Combined forms of age-related cataract, left eye: Secondary | ICD-10-CM | POA: Diagnosis not present

## 2017-10-31 ENCOUNTER — Ambulatory Visit: Payer: Medicare HMO | Admitting: Gastroenterology

## 2017-11-30 ENCOUNTER — Encounter: Payer: Self-pay | Admitting: Gastroenterology

## 2017-11-30 DIAGNOSIS — E785 Hyperlipidemia, unspecified: Secondary | ICD-10-CM | POA: Diagnosis not present

## 2017-11-30 DIAGNOSIS — M1990 Unspecified osteoarthritis, unspecified site: Secondary | ICD-10-CM | POA: Diagnosis not present

## 2017-11-30 DIAGNOSIS — R69 Illness, unspecified: Secondary | ICD-10-CM | POA: Diagnosis not present

## 2017-11-30 DIAGNOSIS — F3342 Major depressive disorder, recurrent, in full remission: Secondary | ICD-10-CM | POA: Diagnosis not present

## 2017-11-30 DIAGNOSIS — I10 Essential (primary) hypertension: Secondary | ICD-10-CM | POA: Diagnosis not present

## 2017-11-30 DIAGNOSIS — C50411 Malignant neoplasm of upper-outer quadrant of right female breast: Secondary | ICD-10-CM | POA: Diagnosis not present

## 2017-11-30 DIAGNOSIS — Z170 Estrogen receptor positive status [ER+]: Secondary | ICD-10-CM | POA: Diagnosis not present

## 2017-12-01 ENCOUNTER — Ambulatory Visit: Payer: Medicare HMO | Admitting: Gastroenterology

## 2017-12-29 ENCOUNTER — Inpatient Hospital Stay: Payer: Medicare HMO

## 2017-12-29 ENCOUNTER — Inpatient Hospital Stay: Payer: Medicare HMO | Attending: Hematology | Admitting: Hematology

## 2017-12-29 ENCOUNTER — Telehealth: Payer: Self-pay | Admitting: Hematology

## 2017-12-29 VITALS — BP 152/74 | HR 70 | Temp 98.2°F | Resp 18 | Ht 63.0 in | Wt 203.8 lb

## 2017-12-29 DIAGNOSIS — Z79899 Other long term (current) drug therapy: Secondary | ICD-10-CM | POA: Diagnosis not present

## 2017-12-29 DIAGNOSIS — E2839 Other primary ovarian failure: Secondary | ICD-10-CM

## 2017-12-29 DIAGNOSIS — C50411 Malignant neoplasm of upper-outer quadrant of right female breast: Secondary | ICD-10-CM

## 2017-12-29 DIAGNOSIS — Z7982 Long term (current) use of aspirin: Secondary | ICD-10-CM | POA: Insufficient documentation

## 2017-12-29 DIAGNOSIS — Z923 Personal history of irradiation: Secondary | ICD-10-CM | POA: Diagnosis not present

## 2017-12-29 DIAGNOSIS — Z853 Personal history of malignant neoplasm of breast: Secondary | ICD-10-CM

## 2017-12-29 DIAGNOSIS — Z170 Estrogen receptor positive status [ER+]: Secondary | ICD-10-CM

## 2017-12-29 DIAGNOSIS — M1990 Unspecified osteoarthritis, unspecified site: Secondary | ICD-10-CM | POA: Diagnosis not present

## 2017-12-29 DIAGNOSIS — I10 Essential (primary) hypertension: Secondary | ICD-10-CM | POA: Diagnosis not present

## 2017-12-29 DIAGNOSIS — Z1231 Encounter for screening mammogram for malignant neoplasm of breast: Secondary | ICD-10-CM

## 2017-12-29 DIAGNOSIS — C50919 Malignant neoplasm of unspecified site of unspecified female breast: Secondary | ICD-10-CM

## 2017-12-29 LAB — CBC WITH DIFFERENTIAL/PLATELET
ABS IMMATURE GRANULOCYTES: 0.01 10*3/uL (ref 0.00–0.07)
BASOS PCT: 1 %
Basophils Absolute: 0 10*3/uL (ref 0.0–0.1)
EOS ABS: 0.1 10*3/uL (ref 0.0–0.5)
Eosinophils Relative: 2 %
HEMATOCRIT: 35.5 % — AB (ref 36.0–46.0)
Hemoglobin: 11.9 g/dL — ABNORMAL LOW (ref 12.0–15.0)
IMMATURE GRANULOCYTES: 0 %
LYMPHS ABS: 0.9 10*3/uL (ref 0.7–4.0)
Lymphocytes Relative: 21 %
MCH: 32.4 pg (ref 26.0–34.0)
MCHC: 33.5 g/dL (ref 30.0–36.0)
MCV: 96.7 fL (ref 80.0–100.0)
MONO ABS: 0.6 10*3/uL (ref 0.1–1.0)
MONOS PCT: 13 %
NEUTROS ABS: 2.7 10*3/uL (ref 1.7–7.7)
NEUTROS PCT: 63 %
Platelets: 204 10*3/uL (ref 150–400)
RBC: 3.67 MIL/uL — ABNORMAL LOW (ref 3.87–5.11)
RDW: 13.2 % (ref 11.5–15.5)
WBC: 4.3 10*3/uL (ref 4.0–10.5)
nRBC: 0 % (ref 0.0–0.2)

## 2017-12-29 LAB — COMPREHENSIVE METABOLIC PANEL
ALBUMIN: 3.7 g/dL (ref 3.5–5.0)
ALK PHOS: 69 U/L (ref 38–126)
ALT: 19 U/L (ref 0–44)
ANION GAP: 9 (ref 5–15)
AST: 21 U/L (ref 15–41)
BILIRUBIN TOTAL: 1 mg/dL (ref 0.3–1.2)
BUN: 17 mg/dL (ref 8–23)
CALCIUM: 9.1 mg/dL (ref 8.9–10.3)
CO2: 28 mmol/L (ref 22–32)
Chloride: 105 mmol/L (ref 98–111)
Creatinine, Ser: 0.93 mg/dL (ref 0.44–1.00)
GFR calc Af Amer: >60 mL/min (ref >60)
GFR calc non Af Amer: 60 mL/min — ABNORMAL LOW (ref >60)
GLUCOSE: 104 mg/dL — AB (ref 70–99)
Potassium: 4.1 mmol/L (ref 3.5–5.1)
SODIUM: 142 mmol/L (ref 135–145)
TOTAL PROTEIN: 7.1 g/dL (ref 6.5–8.1)

## 2017-12-29 NOTE — Progress Notes (Signed)
[place]   [phone] [phone]   Clinic Follow up Note   Patient Care Team: [name], MD as PCP - General (Family Medicine) Gordy Levan, MD (Hematology and Oncology)  Date of Service:  12/29/2017  CHIEF COMPLAINT: F/u for breast cancer, bilateral   ONCOLOGY HISTORY  1. 1998 left T2 with micrometastatic involvement of sentinel node, treated with lumpectomy and axillary node evaluation, chemo/RT/tamoxifen (only for a few years).   2. Spring 2010 right T1bN2 (6 of 23 nodes involved) invasive ductal ER/PR + and HER 2 - treated with  lumpectomy + axillary nodes, adjuvant adria/cytoxan/taxol, radiation and aromatase inhibitor, anastrozole.    CURRENT THERAPY:  Surveillance   INTERVAL HISTORY:  Alison [name] is here for a follow up of her history of breast cancer. She was last seen by me 1 year ago. She presents to the clinic today by herself. She feels she has gained weight. She is still tired from the [place]. She denies any residual effects after completing Anastrozole. She notes she has a mild cold with congestion but would like to get a flu shot today. She denies fever or cough She notes occasional constipation and tried to eat raw fruit.      REVIEW OF SYSTEMS:   Constitutional: Denies fevers, chills or abnormal weight loss Eyes: Denies blurriness of vision Ears, nose, mouth, throat, and face: Denies mucositis or sore throat (+) congestion   Respiratory: Denies cough, dyspnea or wheezes Cardiovascular: Denies palpitation, chest discomfort or lower extremity swelling Gastrointestinal:  Denies nausea, heartburn or change in bowel habits (+) occasional constipation Skin: Denies abnormal skin rashes Lymphatics: Denies new lymphadenopathy or easy bruising Neurological:Denies numbness, tingling or new weaknesses Behavioral/Psych: Mood is stable, no new changes  All other systems were reviewed with the patient and are  negative.  MEDICAL HISTORY:  Past Medical History:  Diagnosis Date  . Anxiety   . Arthritis   . Breast cancer (HCC)    X2  . Cervical dysplasia   . GERD (gastroesophageal reflux disease)    takes otc meds  . Glaucoma   . Hyperlipidemia   . Hypertension   . Lichen sclerosus   . Personal history of chemotherapy 2010  . Personal history of radiation therapy 1999  . Shingles 2012    SURGICAL HISTORY: Past Surgical History:  Procedure Laterality Date  . BREAST BIOPSY Right 2010  . BREAST LUMPECTOMY Right 2010  . BREAST LUMPECTOMY Left   . BREAST SURGERY     Lumpectomy right and left  . GYNECOLOGIC CRYOSURGERY    . HYSTEROSCOPY    . PORT-A-CATH REMOVAL Left 01/31/2014   Procedure: REMOVAL PORT-A-CATH;  Surgeon: Autumn Messing III, MD;  Location: [facility];  Service: General;  Laterality: Left;  . TUBAL LIGATION      I have reviewed the social history and family history with the patient and they are unchanged from previous note.  ALLERGIES:  has No Known Allergies.  MEDICATIONS:  Current Outpatient Medications  Medication Sig Dispense Refill  . aspirin 81 MG tablet Take 81 mg by mouth daily.      . Brinzolamide-Brimonidine (SIMBRINZA) 1-0.2 % SUSP Apply to eye.    . Calcium Carbonate-Vitamin D (CALCIUM + D PO) Take 2 capsules by mouth daily.     . clobetasol cream (TEMOVATE) 0.05 % Apply nightly as needed for itching 30 g 2  . escitalopram (LEXAPRO) 10 MG tablet Take 10 mg by mouth daily.    Marland Kitchen  KLOR-CON M20 20 MEQ tablet Take 20 mEq by mouth 2 (two) times daily.    Marland Kitchen latanoprost (XALATAN) 0.005 % ophthalmic solution 1 drop at bedtime.    Marland Kitchen losartan-hydrochlorothiazide (HYZAAR) 100-25 MG per tablet Take 1 tablet by mouth daily.     . meloxicam (MOBIC) 15 MG tablet TAKE ONE TABLET BY MOUTH ONCE DAILY    . Omega-3 Fatty Acids (FISH OIL PO) Take 1 capsule by mouth daily.     . rosuvastatin (CRESTOR) 10 MG tablet Take 10 mg by mouth daily.      Marland Kitchen omeprazole (PRILOSEC) 20 MG capsule Take  20 mg by mouth.     No current facility-administered medications for this visit.    Facility-Administered Medications Ordered in Other Visits  Medication Dose Route Frequency Provider Last Rate Last Dose  . sodium chloride 0.9 % injection 10 mL  10 mL Intravenous PRN Livesay, Lennis P, MD   10 mL at 02/27/12 1132    PHYSICAL EXAMINATION: ECOG PERFORMANCE STATUS: 1 - Symptomatic but completely ambulatory  Vitals:   12/29/17 1008  BP: (!) 152/74  Pulse: 70  Resp: 18  Temp: 98.2 F (36.8 C)  SpO2: 100%   Filed Weights   12/29/17 1008  Weight: 203 lb 12.8 oz (92.4 kg)    GENERAL:alert, no distress and comfortable SKIN: skin color, texture, turgor are normal, no rashes or significant lesions EYES: normal, Conjunctiva are pink and non-injected, sclera clear OROPHARYNX:no exudate, no erythema and lips, buccal mucosa, and tongue normal  NECK: supple, thyroid normal size, non-tender, without nodularity LYMPH:  no palpable lymphadenopathy in the cervical, axillary or inguinal LUNGS: clear to auscultation and percussion with normal breathing effort HEART: regular rate & rhythm and no murmurs and no lower extremity edema ABDOMEN:abdomen soft, non-tender and normal bowel sounds Musculoskeletal:no cyanosis of digits and no clubbing  NEURO: alert & oriented x 3 with fluent speech, no focal motor/sensory deficits BREAST: (+) S/p left and right lumpectomy: Surgical incisions healed well (+) mild scar tissue in left breast incision. No palpable mass or adenopathy   LABORATORY DATA:  I have reviewed the data as listed CBC Latest Ref Rng & Units 12/29/2017 12/30/2016 01/01/2016  WBC 4.0 - 10.5 K/uL 4.3 3.9 4.2  Hemoglobin 12.0 - 15.0 g/dL 11.9(L) 11.8 13.0  Hematocrit 36.0 - 46.0 % 35.5(L) 36.0 39.6  Platelets 150 - 400 K/uL 204 211 246     CMP Latest Ref Rng & Units 12/29/2017 12/30/2016 01/01/2016  Glucose 70 - 99 mg/dL 104(H) 90 96  BUN 8 - 23 mg/dL 17 14.9 16.1  Creatinine 0.44 - 1.00  mg/dL 0.93 0.9 0.8  Sodium 135 - 145 mmol/L 142 142 141  Potassium 3.5 - 5.1 mmol/L 4.1 3.9 4.0  Chloride 98 - 111 mmol/L 105 - -  CO2 22 - 32 mmol/L '28 27 28  ' Calcium 8.9 - 10.3 mg/dL 9.1 9.2 9.7  Total Protein 6.5 - 8.1 g/dL 7.1 7.0 7.4  Total Bilirubin 0.3 - 1.2 mg/dL 1.0 1.34(H) 1.75(H)  Alkaline Phos 38 - 126 U/L 69 83 73  AST 15 - 41 U/L '21 29 24  ' ALT 0 - 44 U/L '19 26 22      ' RADIOGRAPHIC STUDIES: I have personally reviewed the radiological images as listed and agreed with the findings in the report. No results found.   ASSESSMENT & PLAN:  Alison [name] is a 77 y.o. female with    1. Right upper outer quadrant breast invasive ductal carcinoma,  stage IIIA, ER and PR positive, HER-2 negative -She was diagnosed in 2010 and treated with right breast lumpectomy, adjuvant AC-T, radiation and Anastrozole for 8 years.  -She is clinically doing well. Lab reviewed, her CBC and CMP WNL, except Glucose at 104 and Hg borderline low. Her physical exam and her 03/2017 mammogram were unremarkable. There is no clinical concern for recurrence. -I encouraged her to continue multivitamin, watch her diet and exercise to maintain her health. She agreed.  -Continue Surveillance, F/u in 1 year   2. Bone Health  -Her 03/2015 DEXA was normal -Next DEXA in 03/2018   3. HTN, arthritis -She'll continue follow-up with her primary care physician. -On Hyzarr -BP at 152/74 today (12/29/17)  PLAN:  -Mammogram and DEXA in 03/2018 -Lab and f/u with Lacie in one year     No problem-specific Assessment & Plan notes found for this encounter.   No orders of the defined types were placed in this encounter.  All questions were answered. The patient knows to call the clinic with any problems, questions or concerns. No barriers to learning was detected. I spent 15 minutes counseling the patient face to face. The total time spent in the appointment was 20 minutes and more than 50% was on counseling and  review of test results     Truitt Merle, MD 12/29/2017   I, Joslyn Devon, am acting as scribe for Truitt Merle, MD.   I have reviewed the above documentation for accuracy and completeness, and I agree with the above.

## 2017-12-29 NOTE — Telephone Encounter (Signed)
Printed calendar and avs. °

## 2017-12-30 ENCOUNTER — Encounter: Payer: Self-pay | Admitting: Hematology

## 2018-01-02 ENCOUNTER — Ambulatory Visit: Payer: Medicare HMO | Admitting: Gastroenterology

## 2018-01-20 DIAGNOSIS — R69 Illness, unspecified: Secondary | ICD-10-CM | POA: Diagnosis not present

## 2018-02-05 ENCOUNTER — Ambulatory Visit: Payer: Medicare HMO | Admitting: Gastroenterology

## 2018-03-05 ENCOUNTER — Ambulatory Visit: Payer: Medicare HMO | Admitting: Gastroenterology

## 2018-03-06 ENCOUNTER — Other Ambulatory Visit: Payer: Self-pay | Admitting: Hematology

## 2018-03-06 DIAGNOSIS — Z1231 Encounter for screening mammogram for malignant neoplasm of breast: Secondary | ICD-10-CM

## 2018-04-02 ENCOUNTER — Ambulatory Visit: Payer: Medicare HMO | Admitting: Gastroenterology

## 2018-04-02 ENCOUNTER — Encounter: Payer: Self-pay | Admitting: Gastroenterology

## 2018-04-02 ENCOUNTER — Encounter (INDEPENDENT_AMBULATORY_CARE_PROVIDER_SITE_OTHER): Payer: Self-pay

## 2018-04-02 VITALS — BP 138/80 | HR 74 | Ht 64.0 in | Wt 205.5 lb

## 2018-04-02 DIAGNOSIS — K5909 Other constipation: Secondary | ICD-10-CM

## 2018-04-02 DIAGNOSIS — Z800 Family history of malignant neoplasm of digestive organs: Secondary | ICD-10-CM

## 2018-04-02 MED ORDER — NA SULFATE-K SULFATE-MG SULF 17.5-3.13-1.6 GM/177ML PO SOLN: 1.0000 | Freq: Once | ORAL | 0 refills | Status: AC

## 2018-04-02 NOTE — Progress Notes (Signed)
Alison [name]    [id number]    28-Apr-1940  Primary Care Physician:Nyland, Hollice Espy, MD  Referring Physician: [name], MD [address] [place], [place] [address]  Chief complaint: Colorectal cancer screening  HPI: 78 year old female previously followed by Dr. [name] is here to discuss colorectal cancer screening.  Mother had colon cancer in her 53s.  Colonoscopy 2014 was normal and prior to that in 2007.  She has intermittent constipation, takes Metamucil and over-the-counter stool softeners as needed.  She has daily bowel movement with no blood.  Denies any abdominal pain, nausea, vomiting, [name], loss of appetite or weight loss.     Outpatient Encounter Medications as of 04/02/2018  Medication Sig  . aspirin 81 MG tablet Take 81 mg by mouth daily.    . Brinzolamide-Brimonidine (SIMBRINZA) 1-0.2 % SUSP Apply to eye.  . Calcium Carbonate-Vitamin D (CALCIUM + D PO) Take 2 capsules by mouth daily.   . clobetasol cream (TEMOVATE) 0.05 % Apply nightly as needed for itching  . docusate sodium (STOOL SOFTENER) 100 MG capsule Take 100 mg by mouth 2 (two) times daily as needed for mild constipation.  Marland Kitchen escitalopram (LEXAPRO) 10 MG tablet Take 10 mg by mouth daily.  Marland Kitchen KLOR-CON M20 20 MEQ tablet Take 20 mEq by mouth 2 (two) times daily.  Marland Kitchen latanoprost (XALATAN) 0.005 % ophthalmic solution 1 drop at bedtime.  Marland Kitchen losartan-hydrochlorothiazide (HYZAAR) 100-25 MG per tablet Take 1 tablet by mouth daily.   . meloxicam (MOBIC) 15 MG tablet TAKE ONE TABLET BY MOUTH ONCE DAILY  . Omega-3 Fatty Acids (FISH OIL PO) Take 1 capsule by mouth daily.   . psyllium (METAMUCIL) 58.6 % powder Take 1 packet by mouth as needed.  . rosuvastatin (CRESTOR) 10 MG tablet Take 10 mg by mouth daily.    . [DISCONTINUED] omeprazole (PRILOSEC) 20 MG capsule Take 20 mg by mouth.   Facility-Administered Encounter Medications as of 04/02/2018  Medication  . sodium chloride 0.9 % injection 10 mL     Allergies as of 04/02/2018  . (No Known Allergies)    Past Medical History:  Diagnosis Date  . Anxiety   . Arthritis   . Breast cancer (HCC)    X2  . Cervical dysplasia   . GERD (gastroesophageal reflux disease)    takes otc meds  . Glaucoma   . Hyperlipidemia   . Hypertension   . Lichen sclerosus   . Personal history of chemotherapy 2010  . Personal history of radiation therapy 1999  . Shingles 2012    Past Surgical History:  Procedure Laterality Date  . BREAST BIOPSY Right 2010  . BREAST LUMPECTOMY Right 2010  . BREAST LUMPECTOMY Left   . BREAST SURGERY     Lumpectomy right and left  . GYNECOLOGIC CRYOSURGERY    . HYSTEROSCOPY    . PORT-A-CATH REMOVAL Left 01/31/2014   Procedure: REMOVAL PORT-A-CATH;  Surgeon: Autumn Messing III, MD;  Location: [facility];  Service: General;  Laterality: Left;  . TUBAL LIGATION      Family History  Problem Relation Age of Onset  . Hypertension Mother   . Cancer Mother        colon  . Colon cancer Mother   . Diabetes Sister   . Heart disease Father   . Breast cancer Maternal Aunt 73  . Breast cancer Maternal Aunt     Social History   Socioeconomic History  . Marital status: Divorced  Spouse name: Not on file  . Number of children: 3  . Years of education: Not on file  . Highest education level: Not on file  Occupational History  . Occupation: Retired  Scientific laboratory technician  . Financial resource strain: Not on file  . Food insecurity:    Worry: Not on file    Inability: Not on file  . Transportation needs:    Medical: Not on file    Non-medical: Not on file  Tobacco Use  . Smoking status: Never Smoker  . Smokeless tobacco: Never Used  Substance and Sexual Activity  . Alcohol use: Yes    Comment: Rare  . Drug use: No  . Sexual activity: Not Currently    Birth control/protection: Post-menopausal    Comment: 1st intercourse 78 yo-Fewer than 5 partners  Lifestyle  . Physical activity:    Days per week: Not on file     Minutes per session: Not on file  . Stress: Not on file  Relationships  . Social connections:    Talks on phone: Not on file    Gets together: Not on file    Attends religious service: Not on file    Active member of club or organization: Not on file    Attends meetings of clubs or organizations: Not on file    Relationship status: Not on file  . Intimate partner violence:    Fear of current or ex partner: Not on file    Emotionally abused: Not on file    Physically abused: Not on file    Forced sexual activity: Not on file  Other Topics Concern  . Not on file  Social History Narrative  . Not on file      Review of systems: Review of Systems  Constitutional: Negative for fever and chills.  HENT: Negative.   Eyes: Negative for blurred vision.  Respiratory: Negative for cough, shortness of breath and wheezing.   Cardiovascular: Negative for chest pain and palpitations.  Gastrointestinal: as per HPI Genitourinary: Negative for dysuria, urgency, frequency and hematuria.  Musculoskeletal: Positive for myalgias, back pain and joint pain.  Skin: Negative for itching and rash.  Neurological: Negative for dizziness, tremors, focal weakness, seizures and loss of consciousness.  Endo/Heme/Allergies: Negative for seasonal allergies.  Psychiatric/Behavioral: Negative for depression, suicidal ideas and hallucinations.  All other systems reviewed and are negative.   Physical Exam: Vitals:   04/02/18 1322  BP: 138/80  Pulse: 74   Body mass index is 35.27 kg/m. Gen:      No acute distress HEENT:  EOMI, sclera anicteric Neck:     No masses; no thyromegaly Lungs:    Clear to auscultation bilaterally; normal respiratory effort CV:         Regular rate and rhythm; no murmurs Abd:      + bowel sounds; soft, non-tender; no palpable masses, no distension Ext:    No edema; adequate peripheral perfusion Skin:      Warm and dry; no rash Neuro: alert and oriented x 3 Psych: normal mood  and affect  Data Reviewed:  Reviewed labs, radiology imaging, old records and pertinent past GI work up   Assessment and Plan/Recommendations:  78 year old female with family history of colon cancer in first-degree relative [mother] and chronic constipation Due for colorectal cancer screening, will schedule colonoscopy The risks and benefits as well as alternatives of endoscopic procedure(s) have been discussed and reviewed. All questions answered. The patient agrees to proceed. Constipation: Benefiber 1 teaspoon  3 times daily with meal Increase dietary fiber and fluid intake MiraLAX 1 capful daily as needed    K. Denzil Magnuson , MD [phone]    CC: [name], MD

## 2018-04-02 NOTE — Patient Instructions (Addendum)
You have been scheduled for a colonoscopy. Please follow written instructions given to you at your visit today.  Please pick up your prep supplies at the pharmacy within the next 1-3 days. If you use inhalers (even only as needed), please bring them with you on the day of your procedure.  Take Benefiber 1 teaspoon three times a day or Fiberchoice 1 tablet twice a day   Take Miralax 1 capful daily as needed    Constipation, Adult Constipation is when a person:  Poops (has a bowel movement) fewer times in a week than normal.  Has a hard time pooping.  Has poop that is dry, hard, or bigger than normal. Follow these instructions at home: Eating and drinking   Eat foods that have a lot of fiber, such as: ? Fresh fruits and vegetables. ? Whole grains. ? Beans.  Eat less of foods that are high in fat, low in fiber, or overly processed, such as: ? [place] fries. ? Hamburgers. ? Cookies. ? Candy. ? Soda.  Drink enough fluid to keep your pee (urine) clear or pale yellow. General instructions  Exercise regularly or as told by your doctor.  Go to the restroom when you feel like you need to poop. Do not hold it in.  Take over-the-counter and prescription medicines only as told by your doctor. These include any fiber supplements.  Do pelvic floor retraining exercises, such as: ? Doing deep breathing while relaxing your lower belly (abdomen). ? Relaxing your pelvic floor while pooping.  Watch your condition for any changes.  Keep all follow-up visits as told by your doctor. This is important. Contact a doctor if:  You have pain that gets worse.  You have a fever.  You have not pooped for 4 days.  You throw up (vomit).  You are not hungry.  You lose weight.  You are bleeding from the anus.  You have thin, pencil-like poop (stool). Get help right away if:  You have a fever, and your symptoms suddenly get worse.  You leak poop or have blood in your poop.  Your  belly feels hard or bigger than normal (is bloated).  You have very bad belly pain.  You feel dizzy or you faint. This information is not intended to replace advice given to you by your health care provider. Make sure you discuss any questions you have with your health care provider. Document Released: 06/29/2007 Document Revised: 07/31/2015 Document Reviewed: 07/01/2015 Elsevier Interactive Patient Education  2019 [employer].

## 2018-04-30 ENCOUNTER — Ambulatory Visit: Payer: Medicare HMO

## 2018-04-30 ENCOUNTER — Other Ambulatory Visit: Payer: Medicare HMO

## 2018-05-07 ENCOUNTER — Encounter: Payer: Medicare HMO | Admitting: Gastroenterology

## 2018-05-30 ENCOUNTER — Telehealth: Payer: Self-pay | Admitting: *Deleted

## 2018-05-30 NOTE — Telephone Encounter (Signed)
Called patient to reschedule colonoscopy previously cancelled due to Covid-19. Pt states she needs to speak with her daughter and that she will call me back.

## 2018-05-30 NOTE — Telephone Encounter (Signed)
[phone] patient is returning your call.

## 2018-06-04 NOTE — Telephone Encounter (Signed)
Rescheduled patient for Monday 07/23/18 at 1000 am. Instructions redone and sent to patient.

## 2018-06-07 DIAGNOSIS — E785 Hyperlipidemia, unspecified: Secondary | ICD-10-CM | POA: Diagnosis not present

## 2018-06-07 DIAGNOSIS — I10 Essential (primary) hypertension: Secondary | ICD-10-CM | POA: Diagnosis not present

## 2018-06-07 DIAGNOSIS — R69 Illness, unspecified: Secondary | ICD-10-CM | POA: Diagnosis not present

## 2018-07-06 DIAGNOSIS — H4051X1 Glaucoma secondary to other eye disorders, right eye, mild stage: Secondary | ICD-10-CM | POA: Diagnosis not present

## 2018-07-06 DIAGNOSIS — H4089 Other specified glaucoma: Secondary | ICD-10-CM | POA: Diagnosis not present

## 2018-07-13 ENCOUNTER — Ambulatory Visit
Admission: RE | Admit: 2018-07-13 | Discharge: 2018-07-13 | Disposition: A | Discharge status: Home / Self Care | Payer: Medicare HMO | Source: Ambulatory Visit | Attending: Hematology | Admitting: Hematology

## 2018-07-13 ENCOUNTER — Telehealth: Payer: Self-pay

## 2018-07-13 ENCOUNTER — Other Ambulatory Visit: Payer: Self-pay

## 2018-07-13 DIAGNOSIS — Z1231 Encounter for screening mammogram for malignant neoplasm of breast: Secondary | ICD-10-CM | POA: Diagnosis not present

## 2018-07-13 DIAGNOSIS — Z780 Asymptomatic menopausal state: Secondary | ICD-10-CM | POA: Diagnosis not present

## 2018-07-13 DIAGNOSIS — E2839 Other primary ovarian failure: Secondary | ICD-10-CM

## 2018-07-13 DIAGNOSIS — Z13820 Encounter for screening for osteoporosis: Secondary | ICD-10-CM | POA: Diagnosis not present

## 2018-07-13 NOTE — Telephone Encounter (Signed)
-----   Message from Truitt Merle, MD sent at 07/13/2018  3:58 PM EDT ----- Let her know the DEXA was normal, thanks   Truitt Merle  07/13/2018

## 2018-07-13 NOTE — Telephone Encounter (Signed)
Spoke with pt, advised per Dr. Burr Medico DEXA was normal. Pt verbalized understanding.

## 2018-07-17 ENCOUNTER — Telehealth: Payer: Self-pay | Admitting: Gastroenterology

## 2018-07-17 NOTE — Telephone Encounter (Signed)
Linna Hoff, I want your opinion on how to proceed ?  Should I send patient for pre procedure covid testing or just go ahead with colonoscopy as scheduled if her symptoms doesn't worsen? Thanks

## 2018-07-17 NOTE — Telephone Encounter (Signed)
Patient called and wanted to ask the nurse should she still keep her schedule colon appt on 06/29? She stated that she feels like she may have a sinus infection nose running but she has not had any fever. Please call and advise thanks.

## 2018-07-17 NOTE — Telephone Encounter (Signed)
Dr Silverio Decamp please see pt message she has runny nose, sneezing but no fever.  She has an appt for colon on 6/29.  Do you want her to keep or postpone?

## 2018-07-18 NOTE — Telephone Encounter (Signed)
The pt was contacted and will have COVID testing at [facility] tomorrow 6/25.

## 2018-07-18 NOTE — Telephone Encounter (Signed)
I would err on the safe side and get her tested for Covid.  May have to postpone the procedure to allow time for the test, results.

## 2018-07-18 NOTE — Telephone Encounter (Signed)
Patty, can you please check if we can get her in for pre procedure testing at [facility], will need to reschedule procedure if we dont think will get results back in time. Thanks

## 2018-07-19 ENCOUNTER — Other Ambulatory Visit (HOSPITAL_COMMUNITY): Admission: RE | Admit: 2018-07-19 | Payer: Medicare HMO | Source: Ambulatory Visit

## 2018-07-23 ENCOUNTER — Encounter: Payer: Medicare HMO | Admitting: Gastroenterology

## 2018-07-26 ENCOUNTER — Emergency Department
Admission: EM | Admit: 2018-07-26 | Discharge: 2018-07-26 | Disposition: A | Discharge status: Home / Self Care | Payer: Medicare HMO | Source: Home / Self Care

## 2018-07-26 ENCOUNTER — Other Ambulatory Visit: Payer: Self-pay

## 2018-07-26 DIAGNOSIS — R300 Dysuria: Secondary | ICD-10-CM

## 2018-07-26 DIAGNOSIS — R030 Elevated blood-pressure reading, without diagnosis of hypertension: Secondary | ICD-10-CM

## 2018-07-26 DIAGNOSIS — R0981 Nasal congestion: Secondary | ICD-10-CM

## 2018-07-26 DIAGNOSIS — N3001 Acute cystitis with hematuria: Secondary | ICD-10-CM

## 2018-07-26 LAB — POCT URINALYSIS DIP (MANUAL ENTRY)
Bilirubin, UA: NEGATIVE
Glucose, UA: NEGATIVE mg/dL
Ketones, POC UA: NEGATIVE mg/dL
Nitrite, UA: NEGATIVE
Protein Ur, POC: NEGATIVE mg/dL
Spec Grav, UA: 1.015 (ref 1.010–1.025)
Urobilinogen, UA: 0.2 E.U./dL
pH, UA: 5.5 (ref 5.0–8.0)

## 2018-07-26 MED ORDER — CEPHALEXIN 500 MG PO CAPS: 500.0000 mg | ORAL_CAPSULE | Freq: Two times a day (BID) | ORAL | 0 refills | Status: DC

## 2018-07-26 MED ORDER — FLUTICASONE PROPIONATE 50 MCG/ACT NA SUSP: 2.0000 | Freq: Every day | NASAL | 2 refills | Status: DC

## 2018-07-26 NOTE — ED Triage Notes (Signed)
Right sided back pain.  Has felt anxious the last few weeks.  Frequency with urination.

## 2018-07-26 NOTE — Discharge Instructions (Addendum)
°  Please take your antibiotic as prescribed. A urine culture has been sent to check the severity of your urinary infection and to determine if you are on the most appropriate antibiotic. The results should come back within 2-3 days and you will be notified even if no medication change is needed.  Please stay well hydrated and follow up with your family doctor in 1 week if not improving, sooner if worsening.  Please also pick up the medication prescribed by your family doctor earlier this week to help with your anxiety.   Call 911 or have someone drive you to the hospital if symptoms worsening- chest pain, trouble breathing, dizziness, passing out, worsening back pain, or other new concerning symptoms develop.

## 2018-07-26 NOTE — ED Provider Notes (Signed)
[name]    CSN: [id number] Arrival date & time: 07/26/18  1452     History   Chief Complaint Chief Complaint  Patient presents with  . Back Pain    HPI MADISIN HASAN is a 78 y.o. female.   HPI JILLIANN SUBRAMANIAN is a 78 y.o. female presenting to UC with c/o Right sided flank pain with associated urinary urgency and frequency the last 2 days, worse today.  Hx of UTIs in the past, symptoms feel similar. No medication taken PTA for her symptoms. Denies fever, chills, n/v/d. Pt lives in [place] but is currently visiting her daughter in the area.   Pt also c/o anxiety over the last few weeks. Her anxiety was worsened today after being around her active grandchildren. She notes her PCP called in a medication on Monday, 07/23/2018, for her anxiety but she has not picked it up from the pharmacy yet.   She is also c/o nasal congestion but denies facial pain, HA or dizziness. She use to use a nasal spray but has run out of the medication.   Past Medical History:  Diagnosis Date  . Anxiety   . Arthritis   . Breast cancer (HCC)    X2  . Cervical dysplasia   . GERD (gastroesophageal reflux disease)    takes otc meds  . Glaucoma   . Hyperlipidemia   . Hypertension   . Lichen sclerosus   . Personal history of chemotherapy 2010  . Personal history of radiation therapy 1999  . Shingles 2012    Patient Active Problem List   Diagnosis Date Noted  . Left lumbar radiculopathy 06/16/2015  . Trochanteric bursitis of left hip 06/04/2015  . History of left breast cancer 01/01/2015  . Primary osteoarthritis of both knees 04/17/2013  . Tendinitis, de Quervain's 01/20/2012  . Lichen sclerosus   . Breast cancer of upper-outer quadrant of right female breast ([place]) 09/12/2011  . Shingles     Past Surgical History:  Procedure Laterality Date  . BREAST BIOPSY Right 2010  . BREAST LUMPECTOMY Right 2010  . BREAST LUMPECTOMY Left   . BREAST SURGERY     Lumpectomy right and left   . GYNECOLOGIC CRYOSURGERY    . HYSTEROSCOPY    . PORT-A-CATH REMOVAL Left 01/31/2014   Procedure: REMOVAL PORT-A-CATH;  Surgeon: Autumn Messing III, MD;  Location: [facility];  Service: General;  Laterality: Left;  . TUBAL LIGATION      OB History    Gravida  3   Para  3   Term  3   Preterm      AB      Living  3     SAB      TAB      Ectopic      Multiple      Live Births               Home Medications    Prior to Admission medications   Medication Sig Start Date End Date Taking? Authorizing Provider  aspirin 81 MG tablet Take 81 mg by mouth daily.      [provider]  Brinzolamide-Brimonidine [facility]) 1-0.2 % SUSP Apply to eye.    [provider]  Calcium Carbonate-Vitamin D (CALCIUM + D PO) Take 2 capsules by mouth daily.     [provider]  cephALEXin (KEFLEX) 500 MG capsule Take 1 capsule (500 mg total) by mouth 2 (two) times daily. 07/26/18   Gerarda Fraction,  Bronwen Betters, PA-C  clobetasol cream (TEMOVATE) 0.05 % Apply nightly as needed for itching 06/06/13   Fontaine, Belinda Block, MD  docusate sodium (STOOL SOFTENER) 100 MG capsule Take 100 mg by mouth 2 (two) times daily as needed for mild constipation.    [provider]  escitalopram (LEXAPRO) 10 MG tablet Take 10 mg by mouth daily.    [provider]  fluticasone (FLONASE) 50 MCG/ACT nasal spray Place 2 sprays into both nostrils daily. 07/26/18   Nashay Brickley O, PA-C  KLOR-CON M20 20 MEQ tablet Take 20 mEq by mouth 2 (two) times daily. 12/31/14   [provider]  latanoprost (XALATAN) 0.005 % ophthalmic solution 1 drop at bedtime.    [provider]  losartan-hydrochlorothiazide (HYZAAR) 100-25 MG per tablet Take 1 tablet by mouth daily.     [provider]  meloxicam (MOBIC) 15 MG tablet TAKE ONE TABLET BY MOUTH ONCE DAILY 12/24/14   [provider]  Omega-3 Fatty Acids (FISH OIL PO) Take 1 capsule by mouth daily.     [provider]   psyllium (METAMUCIL) 58.6 % powder Take 1 packet by mouth as needed.    [provider]  rosuvastatin (CRESTOR) 10 MG tablet Take 10 mg by mouth daily.      [provider]    Family History Family History  Problem Relation Age of Onset  . Hypertension Mother   . Cancer Mother        colon  . Colon cancer Mother   . Diabetes Sister   . Heart disease Father   . Breast cancer Maternal Aunt 43  . Breast cancer Maternal Aunt     Social History Social History   Tobacco Use  . Smoking status: Never Smoker  . Smokeless tobacco: Never Used  Substance Use Topics  . Alcohol use: Yes    Comment: Rare  . Drug use: No     Allergies   Patient has no known allergies.   Review of Systems Review of Systems  Constitutional: Negative for chills and fever.  HENT: Positive for congestion. Negative for ear pain, sore throat, trouble swallowing and voice change.   Respiratory: Negative for cough and shortness of breath.   Cardiovascular: Negative for chest pain and palpitations.  Gastrointestinal: Negative for abdominal pain, diarrhea, nausea and vomiting.  Genitourinary: Positive for flank pain (Right), frequency and urgency. Negative for dysuria.  Musculoskeletal: Positive for back pain. Negative for arthralgias and myalgias.  Skin: Negative for rash.  Psychiatric/Behavioral: Positive for sleep disturbance. Negative for confusion and hallucinations. The patient is nervous/anxious.      Physical Exam Triage Vital Signs ED Triage Vitals  Enc Vitals Group     BP 07/26/18 1525 (!) 184/109     Pulse Rate 07/26/18 1525 (!) 119     Resp 07/26/18 1525 20     Temp 07/26/18 1525 99.1 F (37.3 C)     Temp src --      SpO2 07/26/18 1525 98 %     Weight 07/26/18 1526 200 lb (90.7 kg)     Height --      Head Circumference --      Peak Flow --      Pain Score 07/26/18 1526 0     Pain Loc --      Pain Edu? --      Excl. in [place]? --    No data found.  Updated Vital  Signs BP (!) 190/82  Pulse (!) 104   Temp 99.1 F (37.3 C)   Resp 20   Wt 200 lb (90.7 kg)   LMP 01/25/2004   SpO2 98%   BMI 34.33 kg/m   Visual Acuity Right Eye Distance:   Left Eye Distance:   Bilateral Distance:    Right Eye Near:   Left Eye Near:    Bilateral Near:     Physical Exam Vitals signs and nursing note reviewed.  Constitutional:      General: She is not in acute distress.    Appearance: Normal appearance. She is well-developed. She is not ill-appearing, toxic-appearing or diaphoretic.  HENT:     Head: Normocephalic and atraumatic.     Right Ear: Tympanic membrane normal.     Left Ear: Tympanic membrane normal.     Nose: Mucosal edema present. No rhinorrhea.     Right Sinus: No maxillary sinus tenderness or frontal sinus tenderness.     Left Sinus: No maxillary sinus tenderness or frontal sinus tenderness.     Mouth/Throat:     Lips: Pink.     Mouth: Mucous membranes are moist.     Pharynx: Oropharynx is clear. Uvula midline.  Eyes:     Extraocular Movements: Extraocular movements intact.     Pupils: Pupils are equal, round, and reactive to light.  Neck:     Musculoskeletal: Normal range of motion.  Cardiovascular:     Rate and Rhythm: Regular rhythm. Tachycardia present.     Comments: Tachycardic in triage, mild tachycardia on exam Pulmonary:     Effort: Pulmonary effort is normal.     Breath sounds: Normal breath sounds.  Abdominal:     General: There is no distension.     Palpations: Abdomen is soft.     Tenderness: There is no abdominal tenderness. There is no right CVA tenderness or left CVA tenderness.  Musculoskeletal: Normal range of motion.  Skin:    General: Skin is warm and dry.     Capillary Refill: Capillary refill takes less than 2 seconds.  Neurological:     General: No focal deficit present.     Mental Status: She is alert and oriented to person, place, and time.  Psychiatric:        Mood and Affect: Mood normal.         Behavior: Behavior normal.      UC Treatments / Results  Labs (all labs ordered are listed, but only abnormal results are displayed) Labs Reviewed  POCT URINALYSIS DIP (MANUAL ENTRY) - Abnormal; Notable for the following components:      Result Value   Blood, UA small (*)    Leukocytes, UA Trace (*)    All other components within normal limits  URINE CULTURE    EKG   Radiology No results found.  Procedures Procedures (including critical care time)  Medications Ordered in UC Medications - No data to display  Initial Impression / Assessment and Plan / UC Course  I have reviewed the triage vital signs and the nursing notes.  Pertinent labs & imaging results that were available during my care of the patient were reviewed by me and considered in my medical decision making (see chart for details).     Hx and UA c/w UTI Culture sent BP and HR elevated. Improved upon recheck. Pt does report increased anxiety recently, especially today after watching her active grandchildren. She has not started the anxiety medication called into the pharmacy for her by her PCP  on 6/29.  Will tx pt for UTI. Encouraged to start her medication prescribed by her PCP. F/u with PCP next week if needed.  Discussed symptoms that warrant emergent care in the ED.   Final Clinical Impressions(s) / UC Diagnoses   Final diagnoses:  Dysuria  Acute cystitis with hematuria  Elevated blood pressure reading  Nasal congestion     Discharge Instructions      Please take your antibiotic as prescribed. A urine culture has been sent to check the severity of your urinary infection and to determine if you are on the most appropriate antibiotic. The results should come back within 2-3 days and you will be notified even if no medication change is needed.  Please stay well hydrated and follow up with your family doctor in 1 week if not improving, sooner if worsening.  Please also pick up the medication  prescribed by your family doctor earlier this week to help with your anxiety.   Call 911 or have someone drive you to the hospital if symptoms worsening- chest pain, trouble breathing, dizziness, passing out, worsening back pain, or other new concerning symptoms develop.     ED Prescriptions    Medication Sig Dispense Auth. Provider   cephALEXin (KEFLEX) 500 MG capsule Take 1 capsule (500 mg total) by mouth 2 (two) times daily. 14 capsule Legion Discher O, PA-C   fluticasone (FLONASE) 50 MCG/ACT nasal spray Place 2 sprays into both nostrils daily. 16 g Noe Gens, PA-C     Controlled Substance Prescriptions [place] Controlled Substance Registry consulted? Not Applicable   Tyrell Antonio 07/26/18 1655

## 2018-07-27 LAB — URINE CULTURE
MICRO NUMBER:: 630594
Result:: NO GROWTH
SPECIMEN QUALITY:: ADEQUATE

## 2018-07-28 ENCOUNTER — Telehealth: Payer: Self-pay

## 2018-07-28 NOTE — Telephone Encounter (Signed)
Pt states she is feeling better since her UC visit. Notified of neg ucx results. Advised to drink plenty of fluids and f/u with PCP if sxs return.

## 2018-08-10 ENCOUNTER — Other Ambulatory Visit: Payer: Self-pay

## 2018-08-10 ENCOUNTER — Telehealth: Payer: Self-pay | Admitting: Family Medicine

## 2018-08-10 NOTE — Telephone Encounter (Signed)
Please advise and send back to pools    

## 2018-08-10 NOTE — Telephone Encounter (Signed)
Patient is still not feeling well and is needing to be seen. Patient advised that we can do telephone visit and she is aware that no controlled substance will be given over the phone.

## 2018-08-10 NOTE — Telephone Encounter (Signed)
If possible I would like to reschedule, but if it is too far out, I will do it.

## 2018-08-13 ENCOUNTER — Other Ambulatory Visit: Payer: Self-pay

## 2018-08-13 ENCOUNTER — Ambulatory Visit (INDEPENDENT_AMBULATORY_CARE_PROVIDER_SITE_OTHER): Payer: Medicare HMO | Admitting: Physician Assistant

## 2018-08-13 ENCOUNTER — Encounter: Payer: Self-pay | Admitting: Physician Assistant

## 2018-08-13 DIAGNOSIS — E782 Mixed hyperlipidemia: Secondary | ICD-10-CM

## 2018-08-13 DIAGNOSIS — M170 Bilateral primary osteoarthritis of knee: Secondary | ICD-10-CM

## 2018-08-13 DIAGNOSIS — F419 Anxiety disorder, unspecified: Secondary | ICD-10-CM

## 2018-08-13 DIAGNOSIS — Z0000 Encounter for general adult medical examination without abnormal findings: Secondary | ICD-10-CM

## 2018-08-13 DIAGNOSIS — R69 Illness, unspecified: Secondary | ICD-10-CM | POA: Diagnosis not present

## 2018-08-13 DIAGNOSIS — H9193 Unspecified hearing loss, bilateral: Secondary | ICD-10-CM

## 2018-08-13 MED ORDER — MELOXICAM 15 MG PO TABS: 15.0000 mg | ORAL_TABLET | Freq: Every day | ORAL | 1 refills | Status: DC

## 2018-08-14 DIAGNOSIS — F419 Anxiety disorder, unspecified: Secondary | ICD-10-CM | POA: Insufficient documentation

## 2018-08-14 NOTE — Progress Notes (Signed)
Telephone visit  Subjective: Alison [name] with out office PCP: [name], MD Alison [name] is a 78 y.o. female calls for telephone consult today. Patient provides verbal consent for consult held via phone.  Patient is identified with 2 separate identifiers.  At this time the entire area is on COVID-19 social distancing and stay home orders are in place.  Patient is of higher risk and therefore we are performing this by a virtual method.  Location of patient: home Location of provider: [facility] Others present for call: no  Patient is having a ointment to establish with me.  She had been a patient with Dr. Edrick Oh for some years.  A while back she did have a kidney infection and sinus infection.  She was seen by urgent care.  She was given Keflex.  She states that she is feeling some better.  She had even had some rib pain.  She does have issues with tachycardia and she does see cardiology.  Her blood pressure has been elevated but is better.  Patient reports that she also has cataracts that she goes to an eye doctor for.  She also still sees Dr. Annamaria Boots, her oncologist for breast cancer.  She has been through it 2 times.  12, and 10 years ago respectively.  She will need labs performed soon.  An order will be placed for her to come and have them performed.  She also reports that she is feeling a little bit tired and she had been taking more potassium for a while.  So working to reduce her to 1 daily.  She also reports that she is having some hearing loss and would like to have an appointment to have this evaluated.   ROS: Per HPI  No Known Allergies Past Medical History:  Diagnosis Date  . Anxiety   . Arthritis   . Breast cancer (HCC)    X2  . Cervical dysplasia   . GERD (gastroesophageal reflux disease)    takes otc meds  . Glaucoma   . Hyperlipidemia   . Hypertension   . Lichen sclerosus   . Personal history of chemotherapy 2010  . Personal history of radiation therapy  1999  . Shingles 2012    Current Outpatient Medications:  .  aspirin 81 MG tablet, Take 81 mg by mouth daily.  , Disp: , Rfl:  .  Brinzolamide-Brimonidine (SIMBRINZA) 1-0.2 % SUSP, Apply to eye., Disp: , Rfl:  .  Calcium Carbonate-Vitamin D (CALCIUM + D PO), Take 2 capsules by mouth daily. , Disp: , Rfl:  .  clobetasol cream (TEMOVATE) 0.05 %, Apply nightly as needed for itching, Disp: 30 g, Rfl: 2 .  docusate sodium (STOOL SOFTENER) 100 MG capsule, Take 100 mg by mouth 2 (two) times daily as needed for mild constipation., Disp: , Rfl:  .  escitalopram (LEXAPRO) 10 MG tablet, Take 10 mg by mouth daily., Disp: , Rfl:  .  fluticasone (FLONASE) 50 MCG/ACT nasal spray, Place 2 sprays into both nostrils daily., Disp: 16 g, Rfl: 2 .  KLOR-CON M20 20 MEQ tablet, Take 20 mEq by mouth 2 (two) times daily., Disp: , Rfl:  .  latanoprost (XALATAN) 0.005 % ophthalmic solution, 1 drop at bedtime., Disp: , Rfl:  .  LORazepam (ATIVAN) 0.5 MG tablet, Take 0.5 mg by mouth every 8 (eight) hours as needed. for anxiety, Disp: , Rfl:  .  losartan-hydrochlorothiazide (HYZAAR) 100-25 MG per tablet, Take 1 tablet by mouth daily. , Disp: ,  Rfl:  .  meloxicam (MOBIC) 15 MG tablet, Take 1 tablet (15 mg total) by mouth daily., Disp: 90 tablet, Rfl: 1 .  Omega-3 Fatty Acids (FISH OIL PO), Take 1 capsule by mouth daily. , Disp: , Rfl:  .  psyllium (METAMUCIL) 58.6 % powder, Take 1 packet by mouth as needed., Disp: , Rfl:  .  rosuvastatin (CRESTOR) 10 MG tablet, Take 10 mg by mouth daily.  , Disp: , Rfl:  No current facility-administered medications for this visit.   Facility-Administered Medications Ordered in Other Visits:  .  sodium chloride 0.9 % injection 10 mL, 10 mL, Intravenous, PRN, Livesay, Lennis P, MD, 10 mL at 02/27/12 1132  Assessment/ Plan: 78 y.o. female   1. Moderate mixed hyperlipidemia not requiring statin therapy - CBC with Differential/Platelet; Future - CMP14+EGFR; Future - Lipid panel; Future  - TSH; Future  2. Bilateral hearing loss, unspecified hearing loss type - Ambulatory referral to ENT  3. Primary osteoarthritis of both knees - CBC with Differential/Platelet; Future - CMP14+EGFR; Future - Lipid panel; Future - TSH; Future - meloxicam (MOBIC) 15 MG tablet; Take 1 tablet (15 mg total) by mouth daily.  Dispense: 90 tablet; Refill: 1  4. Anxiety  5. Well adult exam - CBC with Differential/Platelet; Future - CMP14+EGFR; Future - Lipid panel; Future - TSH; Future    Return in about 2 months (around 10/14/2018).  Continue all other maintenance medications as listed above.  Start time: 3:46 PM End time: 4:02 PM  Meds ordered this encounter  Medications  . meloxicam (MOBIC) 15 MG tablet    Sig: Take 1 tablet (15 mg total) by mouth daily.    Dispense:  90 tablet    Refill:  1    Order Specific Question:   Supervising Provider    Answer:   Janora Norlander [[id number]]    Particia Nearing PA-C [place] [phone]

## 2018-08-15 ENCOUNTER — Telehealth: Payer: Self-pay | Admitting: Gastroenterology

## 2018-08-15 NOTE — Telephone Encounter (Signed)
Wants to cancel and she will call us when she feels better.

## 2018-08-15 NOTE — Telephone Encounter (Signed)
Pt is scheduled for a colonoscopy on Monday and reported that she has a sinus infection and anxiety.

## 2018-08-16 ENCOUNTER — Telehealth: Payer: Self-pay | Admitting: Physician Assistant

## 2018-08-17 ENCOUNTER — Other Ambulatory Visit: Payer: Self-pay | Admitting: Physician Assistant

## 2018-08-17 ENCOUNTER — Other Ambulatory Visit: Payer: Self-pay | Admitting: *Deleted

## 2018-08-17 MED ORDER — AZITHROMYCIN 250 MG PO TABS: ORAL_TABLET | ORAL | 0 refills | Status: DC

## 2018-08-17 MED ORDER — METOPROLOL SUCCINATE ER 25 MG PO TB24: 12.5000 mg | ORAL_TABLET | Freq: Two times a day (BID) | ORAL | 3 refills | Status: DC

## 2018-08-17 NOTE — Telephone Encounter (Signed)
A prescription of Toprol 25 mg 1/2 tablet twice daily [facility].  Please make appointment for 3 to 4 weeks from now for recheck on her blood pressure A prescription of Zithromax has been sent to that pharmacy also.

## 2018-08-17 NOTE — Telephone Encounter (Addendum)
BP 167/90 Monday 07/20 BP 158/69 Tuesday 07/21 Bp 168/70 Wed 07/22 BP 160/88 Thurs 07/23.   Please review bp readings and advise what patient should do? Patient is also requesting an antibiotic for sinus

## 2018-08-20 ENCOUNTER — Telehealth: Payer: Self-pay | Admitting: Physician Assistant

## 2018-08-20 ENCOUNTER — Encounter: Payer: Medicare HMO | Admitting: Gastroenterology

## 2018-08-20 NOTE — Telephone Encounter (Signed)
Pt aware to take both together. Record BP over next week and let us know results

## 2018-08-21 DIAGNOSIS — J168 Pneumonia due to other specified infectious organisms: Secondary | ICD-10-CM | POA: Diagnosis not present

## 2018-08-21 DIAGNOSIS — Z79899 Other long term (current) drug therapy: Secondary | ICD-10-CM | POA: Diagnosis not present

## 2018-08-21 DIAGNOSIS — R05 Cough: Secondary | ICD-10-CM | POA: Diagnosis not present

## 2018-08-21 DIAGNOSIS — J189 Pneumonia, unspecified organism: Secondary | ICD-10-CM | POA: Diagnosis not present

## 2018-08-21 DIAGNOSIS — R0981 Nasal congestion: Secondary | ICD-10-CM | POA: Diagnosis not present

## 2018-08-21 DIAGNOSIS — J3489 Other specified disorders of nose and nasal sinuses: Secondary | ICD-10-CM | POA: Diagnosis not present

## 2018-08-21 DIAGNOSIS — R079 Chest pain, unspecified: Secondary | ICD-10-CM | POA: Diagnosis not present

## 2018-08-21 DIAGNOSIS — R42 Dizziness and giddiness: Secondary | ICD-10-CM | POA: Diagnosis not present

## 2018-08-21 DIAGNOSIS — E785 Hyperlipidemia, unspecified: Secondary | ICD-10-CM | POA: Diagnosis not present

## 2018-08-21 DIAGNOSIS — R000 Tachycardia, unspecified: Secondary | ICD-10-CM | POA: Diagnosis not present

## 2018-08-21 DIAGNOSIS — Z20828 Contact with and (suspected) exposure to other viral communicable diseases: Secondary | ICD-10-CM | POA: Diagnosis not present

## 2018-08-21 DIAGNOSIS — R0602 Shortness of breath: Secondary | ICD-10-CM | POA: Diagnosis not present

## 2018-08-21 DIAGNOSIS — I10 Essential (primary) hypertension: Secondary | ICD-10-CM | POA: Diagnosis not present

## 2018-08-22 ENCOUNTER — Encounter: Payer: Medicare HMO | Admitting: Gastroenterology

## 2018-08-24 ENCOUNTER — Other Ambulatory Visit: Payer: Self-pay

## 2018-08-24 ENCOUNTER — Encounter: Payer: Self-pay | Admitting: Nurse Practitioner

## 2018-08-24 ENCOUNTER — Ambulatory Visit (INDEPENDENT_AMBULATORY_CARE_PROVIDER_SITE_OTHER): Payer: Medicare HMO | Admitting: Nurse Practitioner

## 2018-08-24 DIAGNOSIS — K521 Toxic gastroenteritis and colitis: Secondary | ICD-10-CM | POA: Diagnosis not present

## 2018-08-24 NOTE — Progress Notes (Signed)
Patient ID: Alison [name], female   DOB: 01-23-1941, 78 y.o.   MRN: [id number]  Virtual Visit via telephone Note  Due to COVID-19 pandemic this visit was conducted virtually. This visit type was conducted due to national recommendations for restrictions regarding the COVID-19 Pandemic (e.g. social distancing, sheltering in place) in an effort to limit this patient's exposure and mitigate transmission in our community. All issues noted in this document were discussed and addressed. A physical exam was not performed with this format.  I connected with [name] on 08/24/18 at 11:50 by telephone and verified that I am speaking with the correct person using two identifiers. ANGELYN OSTERBERG is currently located at home and no one is currently with her during visit. The provider, Mary-Margaret Hassell Done, FNP is located in their office at time of visit.  I discussed the limitations, risks, security and privacy concerns of performing an evaluation and management service by telephone and the availability of in person appointments. I also discussed with the patient that there may be a patient responsible charge related to this service. The patient expressed understanding and agreed to proceed.  History and Present Illness:  Chief Complaint: Diarrhea  HPI  Patient calls in c/o diarrhea. She went to the ER in [place] on Tuesday evening because she has been feeling bad for about a month. She said she just felt bad. They dx her with pneumonia. She is taking antibiotics and she thinks that has caused diarrhea. She has not tried any OTC meds.  Review of Systems  Constitutional: Negative. Negative for diaphoresis and weight loss.  Eyes: Negative for blurred vision, double vision and pain.  Respiratory: Negative for shortness of breath.  Cardiovascular: Negative for chest pain, palpitations, orthopnea and leg swelling.  Gastrointestinal: Positive for diarrhea. Negative for abdominal pain, nausea and vomiting.  Skin:  Negative for rash.  Neurological: Negative for dizziness, sensory change, loss of consciousness, weakness and headaches.  Endo/Heme/Allergies: Negative for polydipsia. Does not bruise/bleed easily.  Psychiatric/Behavioral: Negative for memory loss. The patient does not have insomnia.  All other systems reviewed and are negative.    Observations/Objective:  Alert and oriented No  Distress noted in voice   Assessment and Plan:  [name] in today with chief complaint of Diarrhea   1. Diarrhea due to drug Force fluids Eat yogurt Probiotic may help Imodium AD OTC   Follow Up Instructions:  1 week with A.Jones  I discussed the assessment and treatment plan with the patient. The patient was provided an opportunity to ask questions and all were answered. The patient agreed with the plan and demonstrated an understanding of the instructions.  The patient was advised to call back or seek an in-person evaluation if the symptoms worsen or if the condition fails to improve as anticipated.  The above assessment and management plan was discussed with the patient. The patient verbalized understanding of and has agreed to the management plan. Patient is aware to call the clinic if symptoms persist or worsen. Patient is aware when to return to the clinic for a follow-up visit. Patient educated on when it is appropriate to go to the emergency department.    Time call ended:  12:00   I provided 10 minutes of non-face-to-face time during this encounter.  Mary-Margaret Hassell Done, FNP

## 2018-08-27 ENCOUNTER — Other Ambulatory Visit: Payer: Self-pay | Admitting: Physician Assistant

## 2018-08-27 MED ORDER — ROSUVASTATIN CALCIUM 10 MG PO TABS: 10.0000 mg | ORAL_TABLET | Freq: Every day | ORAL | 3 refills | Status: DC

## 2018-08-27 NOTE — Telephone Encounter (Signed)
What is the name of the medication? Crestor  Have you contacted your pharmacy to request a refill? Yes  Which [facility]    Patient notified that their request is being sent to the clinical staff for review and that they should receive a call once it is complete. If they do not receive a call within 24 hours they can check with their pharmacy or our office.

## 2018-08-27 NOTE — Telephone Encounter (Signed)
Rx sent. Patient aware.  

## 2018-09-03 ENCOUNTER — Encounter: Payer: Self-pay | Admitting: Physician Assistant

## 2018-09-03 ENCOUNTER — Ambulatory Visit (INDEPENDENT_AMBULATORY_CARE_PROVIDER_SITE_OTHER): Payer: Medicare HMO | Admitting: Physician Assistant

## 2018-09-03 DIAGNOSIS — R059 Cough, unspecified: Secondary | ICD-10-CM

## 2018-09-03 DIAGNOSIS — R05 Cough: Secondary | ICD-10-CM

## 2018-09-03 NOTE — Progress Notes (Signed)
Telephone visit  Subjective: CC: Cough, resolved diarrhea PCP: Terald Sleeper, PA-C DXI:Alison [name] is a 78 y.o. female calls for telephone consult today. Patient provides verbal consent for consult held via phone.  Patient is identified with 2 separate identifiers.  At this time the entire area is on COVID-19 social distancing and stay home orders are in place.  Patient is of higher risk and therefore we are performing this by a virtual method.  Location of patient: Home Location of provider: [facility] Others present for call: No  Couple weeks ago the patient had gone to the emergency department for bronchitis.  She did have diarrhea from the antibiotic that she was given.  She has completed it and at this point the diarrhea is doing much better.  She does continue with a cough.  She states it is not very productive.  If she gets anything out it is clear.  She denies seeing any blood.  She denies any fever or chills.  We have discussed using some Mucinex as an expectorant to help break up mucus in her sinuses and chest.  And she will try that.  If anything changes or get worse she will give [place] a call back.   ROS: Per HPI  No Known Allergies Past Medical History:  Diagnosis Date  . Anxiety   . Arthritis   . Breast cancer (HCC)    X2  . Cervical dysplasia   . GERD (gastroesophageal reflux disease)    takes otc meds  . Glaucoma   . Hyperlipidemia   . Hypertension   . Lichen sclerosus   . Personal history of chemotherapy 2010  . Personal history of radiation therapy 1999  . Shingles 2012    Current Outpatient Medications:  .  aspirin 81 MG tablet, Take 81 mg by mouth daily.  , Disp: , Rfl:  .  azithromycin (ZITHROMAX Z-PAK) 250 MG tablet, Take as directed for SINUSITIS, Disp: 6 each, Rfl: 0 .  Brinzolamide-Brimonidine (SIMBRINZA) 1-0.2 % SUSP, Apply to eye., Disp: , Rfl:  .  Calcium Carbonate-Vitamin D (CALCIUM + D PO), Take 2 capsules by mouth daily. , Disp: , Rfl:  .   clobetasol cream (TEMOVATE) 0.05 %, Apply nightly as needed for itching, Disp: 30 g, Rfl: 2 .  docusate sodium (STOOL SOFTENER) 100 MG capsule, Take 100 mg by mouth 2 (two) times daily as needed for mild constipation., Disp: , Rfl:  .  escitalopram (LEXAPRO) 10 MG tablet, Take 10 mg by mouth daily., Disp: , Rfl:  .  fluticasone (FLONASE) 50 MCG/ACT nasal spray, Place 2 sprays into both nostrils daily., Disp: 16 g, Rfl: 2 .  KLOR-CON M20 20 MEQ tablet, Take 20 mEq by mouth 2 (two) times daily., Disp: , Rfl:  .  latanoprost (XALATAN) 0.005 % ophthalmic solution, 1 drop at bedtime., Disp: , Rfl:  .  LORazepam (ATIVAN) 0.5 MG tablet, Take 0.5 mg by mouth every 8 (eight) hours as needed. for anxiety, Disp: , Rfl:  .  losartan-hydrochlorothiazide (HYZAAR) 100-25 MG per tablet, Take 1 tablet by mouth daily. , Disp: , Rfl:  .  meloxicam (MOBIC) 15 MG tablet, Take 1 tablet (15 mg total) by mouth daily., Disp: 90 tablet, Rfl: 1 .  metoprolol succinate (TOPROL-XL) 25 MG 24 hr tablet, Take 0.5 tablets (12.5 mg total) by mouth 2 (two) times a day. Blood pressure and heart rate, Disp: 90 tablet, Rfl: 3 .  Omega-3 Fatty Acids (FISH OIL PO), Take  1 capsule by mouth daily. , Disp: , Rfl:  .  psyllium (METAMUCIL) 58.6 % powder, Take 1 packet by mouth as needed., Disp: , Rfl:  .  rosuvastatin (CRESTOR) 10 MG tablet, Take 1 tablet (10 mg total) by mouth daily., Disp: 90 tablet, Rfl: 3 No current facility-administered medications for this visit.   Facility-Administered Medications Ordered in Other Visits:  .  sodium chloride 0.9 % injection 10 mL, 10 mL, Intravenous, PRN, Livesay, Lennis P, MD, 10 mL at 02/27/12 1132  Assessment/ Plan: 78 y.o. female   COUGH: Guaifenesin 3-4 times a day.  Please speak with pharmacist if you cannot figure out which one is best to take.  I advised her not to take anything with a decongestant.   No follow-ups on file.  Continue all other maintenance medications as listed above.   Start time: 8:11 AM End time: 8:19 AM  No orders of the defined types were placed in this encounter.   Particia Nearing PA-C [place] [phone]

## 2018-09-03 NOTE — Progress Notes (Deleted)
  Subjective:     Alison [name] is a 78 y.o. female who presents for evaluation of diarrhea. Onset of diarrhea was {1-10:13787} {units:19136::"days"} ago. Diarrhea is occurring approximately {1-12:311357} times per day. Patient describes diarrhea as {desc; diarrhea:14012}. Diarrhea has been associated with {symptoms:14176}. Patient denies {diarrhea denies:14177}. Previous visits for diarrhea: {seen previously:14038}. Evaluation to date: {diarrhea evaluation to date:12373}.  Treatment to date: ***.  {Common ambulatory SmartLinks:19316}  Review of Systems {ros - complete:30496}    Objective:    LMP 01/25/2004  General: {gen appear:16600}  Hydration:  {hydration status:5166::"well hydrated"}  Abdomen:    {abdomen exam:16834}    Assessment:    {diarrhea dx:14049}   Plan:    {diarrhea tx:14050}

## 2018-09-04 ENCOUNTER — Telehealth: Payer: Self-pay | Admitting: Physician Assistant

## 2018-09-04 ENCOUNTER — Other Ambulatory Visit: Payer: Self-pay | Admitting: Physician Assistant

## 2018-09-04 MED ORDER — CEFDINIR 300 MG PO CAPS: 300.0000 mg | ORAL_CAPSULE | Freq: Two times a day (BID) | ORAL | 0 refills | Status: DC

## 2018-09-04 NOTE — Telephone Encounter (Signed)
Sent omnicef to the pharmacy

## 2018-09-05 NOTE — Telephone Encounter (Signed)
Left message, script was sent to pharmacy.

## 2018-09-06 ENCOUNTER — Encounter (INDEPENDENT_AMBULATORY_CARE_PROVIDER_SITE_OTHER): Payer: Medicare HMO | Admitting: *Deleted

## 2018-09-06 ENCOUNTER — Ambulatory Visit: Payer: Medicare HMO | Admitting: *Deleted

## 2018-09-10 NOTE — Progress Notes (Signed)
Patient not up to doing Medicare wellness visit today.  Appt reschedule.

## 2018-09-20 ENCOUNTER — Ambulatory Visit: Payer: Medicare HMO

## 2018-10-03 ENCOUNTER — Ambulatory Visit (INDEPENDENT_AMBULATORY_CARE_PROVIDER_SITE_OTHER): Payer: Medicare HMO | Admitting: *Deleted

## 2018-10-03 ENCOUNTER — Ambulatory Visit: Payer: Medicare HMO

## 2018-10-03 DIAGNOSIS — Z0000 Encounter for general adult medical examination without abnormal findings: Secondary | ICD-10-CM

## 2018-10-03 DIAGNOSIS — Z0001 Encounter for general adult medical examination with abnormal findings: Secondary | ICD-10-CM

## 2018-10-03 DIAGNOSIS — H9193 Unspecified hearing loss, bilateral: Secondary | ICD-10-CM

## 2018-10-03 NOTE — Progress Notes (Signed)
MEDICARE ANNUAL WELLNESS VISIT  10/03/2018  Telephone Visit Disclaimer This Medicare AWV was conducted by telephone due to national recommendations for restrictions regarding the COVID-19 Pandemic (e.g. social distancing).  I verified, using two identifiers, that I am speaking with Alison [name] or their authorized healthcare agent. I discussed the limitations, risks, security, and privacy concerns of performing an evaluation and management service by telephone and the potential availability of an in-person appointment in the future. The patient expressed understanding and agreed to proceed.   Subjective:  Alison [name] is a 78 y.o. female patient of Terald Sleeper, PA-C who had a Medicare Annual Wellness Visit today via telephone. Alison [name] is Retired and lives alone and is staying with her daughter some right now while getting over pneumonia.She has 3 children. She reports that she is socially active and does interact with friends/family regularly. She is minimally physically active and enjoys reading, shopping, going out to eat, to the movies, and spending time with family.  Patient Care Team: Theodoro Clock as PCP - General (Physician Assistant) Gordy Levan, MD (Hematology and Oncology)  Advanced Directives 10/03/2018 12/30/2016 01/01/2016 07/09/2015 01/01/2015 01/29/2014 08/15/2013  Does Patient Have a Medical Advance Directive? No No No No No No Patient does not have advance directive  Would patient like information on creating a medical advance directive? No - Patient declined - - No - patient declined information - - [facility] Utilization Over the Past 12 Months: # of hospitalizations or ER visits: 1 ER visit for pneumonia # of surgeries: 0  Review of Systems    Patient reports that her overall health is unchanged compared to last year.  History obtained from chart review and patient  Patient Reported Readings (BP, Pulse, CBG, Weight, etc) none  Pain Assessment        Current Medications & Allergies (verified) Allergies as of 10/03/2018   No Known Allergies     Medication List       Accurate as of October 03, 2018 10:59 AM. If you have any questions, ask your nurse or doctor.        STOP taking these medications   cefdinir 300 MG capsule Commonly known as: OMNICEF     TAKE these medications   aspirin 81 MG tablet Take 81 mg by mouth daily.   CALCIUM + D PO Take 2 capsules by mouth daily.   clobetasol cream 0.05 % Commonly known as: TEMOVATE Apply nightly as needed for itching   escitalopram 10 MG tablet Commonly known as: LEXAPRO Take 10 mg by mouth daily.   FISH OIL PO Take 1 capsule by mouth daily.   fluticasone 50 MCG/ACT nasal spray Commonly known as: FLONASE Place 2 sprays into both nostrils daily.   Klor-Con M20 20 MEQ tablet Generic drug: potassium chloride SA Take 20 mEq by mouth 2 (two) times daily.   latanoprost 0.005 % ophthalmic solution Commonly known as: XALATAN 1 drop at bedtime.   LORazepam 0.5 MG tablet Commonly known as: ATIVAN Take 0.5 mg by mouth every 8 (eight) hours as needed. for anxiety   losartan-hydrochlorothiazide 100-25 MG tablet Commonly known as: HYZAAR Take 1 tablet by mouth daily.   meloxicam 15 MG tablet Commonly known as: MOBIC Take 1 tablet (15 mg total) by mouth daily.   metoprolol succinate 25 MG 24 hr tablet Commonly known as: TOPROL-XL Take 0.5 tablets (12.5 mg total) by mouth 2 (two) times a day. Blood pressure  and heart rate   psyllium 58.6 % powder Commonly known as: METAMUCIL Take 1 packet by mouth as needed.   rosuvastatin 10 MG tablet Commonly known as: CRESTOR Take 1 tablet (10 mg total) by mouth daily.   Simbrinza 1-0.2 % Susp Generic drug: Brinzolamide-Brimonidine Apply to eye.   Stool Softener 100 MG capsule Generic drug: docusate sodium Take 100 mg by mouth 2 (two) times daily as needed for mild constipation.       History (reviewed): Past  Medical History:  Diagnosis Date  . Anxiety   . Arthritis   . Breast cancer (HCC)    X2  . Cervical dysplasia   . GERD (gastroesophageal reflux disease)    takes otc meds  . Glaucoma   . Hyperlipidemia   . Hypertension   . Lichen sclerosus   . Personal history of chemotherapy 2010  . Personal history of radiation therapy 1999  . Shingles 2012   Past Surgical History:  Procedure Laterality Date  . BREAST BIOPSY Right 2010  . BREAST LUMPECTOMY Right 2010  . BREAST LUMPECTOMY Left   . BREAST SURGERY     Lumpectomy right and left  . GYNECOLOGIC CRYOSURGERY    . HYSTEROSCOPY    . PORT-A-CATH REMOVAL Left 01/31/2014   Procedure: REMOVAL PORT-A-CATH;  Surgeon: Autumn Messing III, MD;  Location: [facility];  Service: General;  Laterality: Left;  . TUBAL LIGATION     Family History  Problem Relation Age of Onset  . Hypertension Mother   . Cancer Mother        colon  . Colon cancer Mother   . Diabetes Sister   . Heart disease Father   . Breast cancer Maternal Aunt 35  . Breast cancer Maternal Aunt    Social History   Socioeconomic History  . Marital status: Divorced    Spouse name: Not on file  . Number of children: 3  . Years of education: 10  . Highest education level: 10th grade  Occupational History  . Occupation: Retired  Scientific laboratory technician  . Financial resource strain: Not very hard  . Food insecurity    Worry: Never true    Inability: Never true  . Transportation needs    Medical: No    Non-medical: No  Tobacco Use  . Smoking status: Never Smoker  . Smokeless tobacco: Never Used  Substance and Sexual Activity  . Alcohol use: Yes    Comment: Rare  . Drug use: No  . Sexual activity: Not Currently    Birth control/protection: Post-menopausal    Comment: 1st intercourse 78 yo-Fewer than 5 partners  Lifestyle  . Physical activity    Days per week: 0 days    Minutes per session: 0 min  . Stress: Not at all  Relationships  . Social connections    Talks on phone: More  than three times a week    Gets together: Once a week    Attends religious service: More than 4 times per year    Active member of club or organization: Yes    Attends meetings of clubs or organizations: 1 to 4 times per year    Relationship status: Divorced  Other Topics Concern  . Not on file  Social History Narrative  . Not on file    Activities of Daily Living In your present state of health, do you have any difficulty performing the following activities: 10/03/2018  Hearing? Y  Vision? N  Comment wears glasses and she does  have glaucoma  Difficulty concentrating or making decisions? N  Walking or climbing stairs? Y  Comment climbing stairs due to knee  Dressing or bathing? N  Doing errands, shopping? N  Preparing Food and eating ? N  Using the Toilet? N  In the past six months, have you accidently leaked urine? N  Do you have problems with loss of bowel control? N  Managing your Medications? N  Managing your Finances? N  Housekeeping or managing your Housekeeping? N  Some recent data might be hidden   Patient would like referral to audiology to have hearing examined.    Patient Education/ Literacy    Exercise Current Exercise Habits: The patient does not participate in regular exercise at present, Exercise limited by: orthopedic condition(s)  Diet Patient reports consuming 3 meals a day and 1 snack(s) a day Patient reports that her primary diet is: Regular Patient reports that she does have regular access to food.   Depression Screen PHQ 2/9 Scores 10/03/2018 08/15/2013  PHQ - 2 Score 0 0     Fall Risk Fall Risk  10/03/2018 08/15/2013  Falls in the past year? 0 No     Objective:  EASTER SIMPSON seemed alert and oriented and she participated appropriately during our telephone visit.  Blood Pressure Weight BMI  BP Readings from Last 3 Encounters:  07/26/18 (!) 190/82  04/02/18 138/80  12/29/17 (!) 152/74   Wt Readings from Last 3 Encounters:  07/26/18 200 lb  (90.7 kg)  04/02/18 205 lb 8 oz (93.2 kg)  12/29/17 203 lb 12.8 oz (92.4 kg)   BMI Readings from Last 1 Encounters:  07/26/18 34.33 kg/m    *Unable to obtain current vital signs, weight, and BMI due to telephone visit type  Hearing/Vision  . Chauntia did not seem to have difficulty with hearing/understanding during the telephone conversation . Reports that she has not had a formal eye exam by an eye care professional within the past year . Reports that she has not had a formal hearing evaluation within the past year *Unable to fully assess hearing and vision during telephone visit type  Cognitive Function: 6CIT Screen 10/03/2018  What Year? 0 points  What month? 0 points  What time? 0 points  Count back from 20 0 points  Months in reverse 0 points  Repeat phrase 0 points  Total Score 0   (Normal:0-7, Significant for Dysfunction: >8)  Normal Cognitive Function Screening: Yes   Immunization & Health Maintenance Record Immunization History  Administered Date(s) Administered  . Influenza Split 01/20/2018  . Influenza, High Dose Seasonal PF 01/07/2016  . Influenza, Seasonal, Injecte, Preservative Fre 12/03/2012, 01/01/2015  . Influenza,inj,Quad PF,6+ Mos 12/03/2012, 01/01/2015  . Influenza,trivalent, recombinat, inj, PF 11/11/2013, 10/25/2016  . Pneumococcal Conjugate-13 02/17/2016  . Pneumococcal Polysaccharide-23 05/31/2017    Health Maintenance  Topic Date Due  . TETANUS/TDAP  03/20/1960  . COLONOSCOPY  04/17/2017  . INFLUENZA VACCINE  08/25/2018  . DEXA SCAN  Completed  . PNA vac Low Risk Adult  Completed       Assessment  This is a routine wellness examination for [employer].  Health Maintenance: Due or Overdue Health Maintenance Due  Topic Date Due  . TETANUS/TDAP  03/20/1960  . COLONOSCOPY  04/17/2017  . INFLUENZA VACCINE  08/25/2018    Alison [name] does not need a referral for Community Assistance: Care Management:   no Social Work:    no  Prescription Assistance:  no Nutrition/Diabetes Education:  no   Plan:  Personalized Goals Goals Addressed            This Visit's Progress   . awv       10/03/2018 AWV Goal: Exercise for General Health   Patient will verbalize understanding of the benefits of increased physical activity:  Exercising regularly is important. It will improve your overall fitness, flexibility, and endurance.  Regular exercise also will improve your overall health. It can help you control your weight, reduce stress, and improve your bone density.  Over the next year, patient will increase physical activity as tolerated with a goal of at least 150 minutes of moderate physical activity per week.   You can tell that you are exercising at a moderate intensity if your heart starts beating faster and you start breathing faster but can still hold a conversation.  Moderate-intensity exercise ideas include:  Walking 1 mile (1.6 km) in about 15 minutes  Biking  Hiking  Golfing  Dancing  Water aerobics  Patient will verbalize understanding of everyday activities that increase physical activity by providing examples like the following: ? Yard work, such as: ? Pushing a Conservation officer, nature ? Raking and bagging leaves ? Washing your car ? Pushing a stroller ? Shoveling snow ? Gardening ? Washing windows or floors  Patient will be able to explain general safety guidelines for exercising:   Before you start a new exercise program, talk with your health care provider.  Do not exercise so much that you hurt yourself, feel dizzy, or get very short of breath.  Wear comfortable clothes and wear shoes with good support.  Drink plenty of water while you exercise to prevent dehydration or heat stroke.  Work out until your breathing and your heartbeat get faster.       Personalized Health Maintenance & Screening Recommendations  Influenza vaccine Td vaccine Colorectal cancer screening  Lung Cancer  Screening Recommended: no (Low Dose CT Chest recommended if Age 17-80 years, 30 pack-year currently smoking OR have quit w/in past 15 years) Hepatitis C Screening recommended: no HIV Screening recommended: no  Advanced Directives: Written information was not prepared per patient's request.  Referrals & Orders Audiology  Follow-up Plan . Follow-up with Terald Sleeper, PA-C as planned . Please call and reschedule your colonoscopy . Please schedule your eye exam   I have personally reviewed and noted the following in the patient's chart:   . Medical and social history . Use of alcohol, tobacco or illicit drugs  . Current medications and supplements . Functional ability and status . Nutritional status . Physical activity . Advanced directives . List of other physicians . Hospitalizations, surgeries, and ER visits in previous 12 months . Vitals . Screenings to include cognitive, depression, and falls . Referrals and appointments  In addition, I have reviewed and discussed with Alison [name] certain preventive protocols, quality metrics, and best practice recommendations. A written personalized care plan for preventive services as well as general preventive health recommendations is available and can be mailed to the patient at her request.      Gareth Morgan, LPN  [phone]

## 2018-10-12 ENCOUNTER — Ambulatory Visit: Payer: Medicare HMO | Admitting: Physician Assistant

## 2018-11-01 ENCOUNTER — Encounter: Payer: Self-pay | Admitting: Gynecology

## 2018-11-06 DIAGNOSIS — H903 Sensorineural hearing loss, bilateral: Secondary | ICD-10-CM | POA: Diagnosis not present

## 2018-11-06 DIAGNOSIS — Z570 Occupational exposure to noise: Secondary | ICD-10-CM | POA: Diagnosis not present

## 2018-11-09 ENCOUNTER — Other Ambulatory Visit: Payer: Self-pay

## 2018-11-12 ENCOUNTER — Other Ambulatory Visit: Payer: Self-pay

## 2018-11-12 ENCOUNTER — Encounter: Payer: Self-pay | Admitting: Physician Assistant

## 2018-11-12 ENCOUNTER — Ambulatory Visit (INDEPENDENT_AMBULATORY_CARE_PROVIDER_SITE_OTHER): Payer: Medicare HMO | Admitting: Physician Assistant

## 2018-11-12 DIAGNOSIS — Z23 Encounter for immunization: Secondary | ICD-10-CM | POA: Diagnosis not present

## 2018-11-12 DIAGNOSIS — E782 Mixed hyperlipidemia: Secondary | ICD-10-CM

## 2018-11-12 DIAGNOSIS — Z0000 Encounter for general adult medical examination without abnormal findings: Secondary | ICD-10-CM | POA: Diagnosis not present

## 2018-11-12 DIAGNOSIS — M170 Bilateral primary osteoarthritis of knee: Secondary | ICD-10-CM | POA: Diagnosis not present

## 2018-11-12 MED ORDER — CETIRIZINE HCL 10 MG PO TABS: 10.0000 mg | ORAL_TABLET | Freq: Every day | ORAL | 3 refills | Status: DC

## 2018-11-12 MED ORDER — FLUTICASONE PROPIONATE 50 MCG/ACT NA SUSP: 2.0000 | Freq: Every day | NASAL | 3 refills | Status: DC

## 2018-11-12 MED ORDER — LOSARTAN POTASSIUM-HCTZ 100-25 MG PO TABS: 1.0000 | ORAL_TABLET | Freq: Every day | ORAL | 3 refills | Status: DC

## 2018-11-12 MED ORDER — METOPROLOL SUCCINATE ER 25 MG PO TB24: 12.5000 mg | ORAL_TABLET | Freq: Every day | ORAL | 3 refills | Status: DC

## 2018-11-12 MED ORDER — KLOR-CON M20 20 MEQ PO TBCR: 20.0000 meq | EXTENDED_RELEASE_TABLET | Freq: Two times a day (BID) | ORAL | 3 refills | Status: DC

## 2018-11-12 MED ORDER — MELOXICAM 15 MG PO TABS: 15.0000 mg | ORAL_TABLET | Freq: Every day | ORAL | 1 refills | Status: DC

## 2018-11-12 MED ORDER — ESCITALOPRAM OXALATE 10 MG PO TABS: 10.0000 mg | ORAL_TABLET | Freq: Every day | ORAL | 3 refills | Status: DC

## 2018-11-12 MED ORDER — ROSUVASTATIN CALCIUM 10 MG PO TABS: 10.0000 mg | ORAL_TABLET | Freq: Every day | ORAL | 3 refills | Status: DC

## 2018-11-12 NOTE — Progress Notes (Signed)
BP (!) 146/76   Pulse 70   Temp (!) 97.1 F (36.2 C) (Temporal)   Ht '5\' 4"'  (1.626 m)   Wt 194 lb 8 oz (88.2 kg)   LMP 01/25/2004   SpO2 98%   BMI 33.39 kg/m    Subjective:    Patient ID: Alison [name], female    DOB: May 29, 1940, 78 y.o.   MRN: [id number]  HPI: Alison [name] is a 78 y.o. female presenting on 11/12/2018 for Hypertension (follow up) and Medical Management of Chronic Issues  This patient comes in for periodic recheck on her chronic medical conditions they do include hypertension, osteoarthritis, hyperlipidemia.  All of her medications are reviewed.  We will update her labs today.  She states that her knees have gotten much worse in recent months.  She has had osteoarthritis for some time.  At this time the right is greater than the left.  She is ready to have orthopedic referral..  Past Medical History:  Diagnosis Date  . Anxiety   . Arthritis   . Breast cancer (HCC)    X2  . Cervical dysplasia   . GERD (gastroesophageal reflux disease)    takes otc meds  . Glaucoma   . Hyperlipidemia   . Hypertension   . Lichen sclerosus   . Personal history of chemotherapy 2010  . Personal history of radiation therapy 1999  . Shingles 2012   Relevant past medical, surgical, family and social history reviewed and updated as indicated. Interim medical history since our last visit reviewed. Allergies and medications reviewed and updated. DATA REVIEWED: CHART IN EPIC  Family History reviewed for pertinent findings.  Review of Systems  Constitutional: Negative.   HENT: Negative.   Eyes: Negative.   Respiratory: Negative.   Cardiovascular: Negative.  Negative for chest pain, palpitations and leg swelling.  Gastrointestinal: Negative.   Genitourinary: Negative.   Musculoskeletal: Positive for arthralgias, gait problem and joint swelling.    Allergies as of 11/12/2018   No Known Allergies     Medication List       Accurate as of November 12, 2018 10:58 AM. If you  have any questions, ask your nurse or doctor.        aspirin 81 MG tablet Take 81 mg by mouth daily.   CALCIUM + D PO Take 2 capsules by mouth daily.   clobetasol cream 0.05 % Commonly known as: TEMOVATE Apply nightly as needed for itching   escitalopram 10 MG tablet Commonly known as: LEXAPRO Take 1 tablet (10 mg total) by mouth daily.   FISH OIL PO Take 1 capsule by mouth daily.   fluticasone 50 MCG/ACT nasal spray Commonly known as: FLONASE Place 2 sprays into both nostrils daily.   Klor-Con M20 20 MEQ tablet Generic drug: potassium chloride SA Take 1 tablet (20 mEq total) by mouth 2 (two) times daily.   latanoprost 0.005 % ophthalmic solution Commonly known as: XALATAN 1 drop at bedtime.   LORazepam 0.5 MG tablet Commonly known as: ATIVAN Take 0.5 mg by mouth every 8 (eight) hours as needed. for anxiety   losartan-hydrochlorothiazide 100-25 MG tablet Commonly known as: HYZAAR Take 1 tablet by mouth daily.   meloxicam 15 MG tablet Commonly known as: MOBIC Take 1 tablet (15 mg total) by mouth daily.   metoprolol succinate 25 MG 24 hr tablet Commonly known as: TOPROL-XL Take 0.5 tablets (12.5 mg total) by mouth daily. Blood pressure and heart rate What changed: when to take this  Changed by: Terald Sleeper, PA-C   psyllium 58.6 % powder Commonly known as: METAMUCIL Take 1 packet by mouth as needed.   rosuvastatin 10 MG tablet Commonly known as: CRESTOR Take 1 tablet (10 mg total) by mouth daily.   Simbrinza 1-0.2 % Susp Generic drug: Brinzolamide-Brimonidine Apply to eye.   Stool Softener 100 MG capsule Generic drug: docusate sodium Take 100 mg by mouth 2 (two) times daily as needed for mild constipation.          Objective:    BP (!) 146/76   Pulse 70   Temp (!) 97.1 F (36.2 C) (Temporal)   Ht '5\' 4"'  (1.626 m)   Wt 194 lb 8 oz (88.2 kg)   LMP 01/25/2004   SpO2 98%   BMI 33.39 kg/m   No Known Allergies  Wt Readings from Last 3  Encounters:  11/12/18 194 lb 8 oz (88.2 kg)  07/26/18 200 lb (90.7 kg)  04/02/18 205 lb 8 oz (93.2 kg)    Physical Exam Constitutional:      General: She is not in acute distress.    Appearance: Normal appearance. She is well-developed.  HENT:     Head: Normocephalic and atraumatic.  Cardiovascular:     Rate and Rhythm: Normal rate.  Pulmonary:     Effort: Pulmonary effort is normal.  Musculoskeletal:     Right knee: She exhibits swelling and deformity. Tenderness found.     Left knee: She exhibits swelling and deformity. Tenderness found.  Skin:    General: Skin is warm and dry.     Findings: No rash.  Neurological:     Mental Status: She is alert and oriented to person, place, and time.     Deep Tendon Reflexes: Reflexes are normal and symmetric.         Assessment & Plan:    1. Primary osteoarthritis of both knees - meloxicam (MOBIC) 15 MG tablet; Take 1 tablet (15 mg total) by mouth daily.  Dispense: 90 tablet; Refill: 1 - TSH - Lipid panel - CMP14+EGFR - CBC with Differential/Platelet - Ambulatory referral to Orthopedic Surgery  2. Well adult exam - TSH - Lipid panel - CMP14+EGFR - CBC with Differential/Platelet  3. Moderate mixed hyperlipidemia not requiring statin therapy  - TSH - Lipid panel - CMP14+EGFR - CBC with Differential/Platelet  4. Need for immunization against influenza - Flu Vaccine QUAD High Dose(Fluad)   Continue all other maintenance medications as listed above.  Follow up plan: Return in about 3 months (around 02/12/2019).  Educational handout given for osteoarthritis  Terald Sleeper PA-C [place] [address]  [place], [place] [address] [phone]   11/12/2018, 10:58 AM

## 2018-11-12 NOTE — Patient Instructions (Signed)

## 2018-11-13 LAB — CMP14+EGFR
ALT: 19 IU/L (ref 0–32)
AST: 26 IU/L (ref 0–40)
Albumin/Globulin Ratio: 1.6 (ref 1.2–2.2)
Albumin: 4.4 g/dL (ref 3.7–4.7)
Alkaline Phosphatase: 63 IU/L (ref 39–117)
BUN/Creatinine Ratio: 13 (ref 12–28)
BUN: 14 mg/dL (ref 8–27)
Bilirubin Total: 1.5 mg/dL — ABNORMAL HIGH (ref 0.0–1.2)
CO2: 23 mmol/L (ref 20–29)
Calcium: 9.9 mg/dL (ref 8.7–10.3)
Chloride: 102 mmol/L (ref 96–106)
Creatinine, Ser: 1.04 mg/dL — ABNORMAL HIGH (ref 0.57–1.00)
GFR calc Af Amer: 60 mL/min/{1.73_m2} (ref >59)
GFR calc non Af Amer: 52 mL/min/{1.73_m2} — ABNORMAL LOW (ref >59)
Globulin, Total: 2.8 g/dL (ref 1.5–4.5)
Glucose: 105 mg/dL — ABNORMAL HIGH (ref 65–99)
Potassium: 4.2 mmol/L (ref 3.5–5.2)
Sodium: 140 mmol/L (ref 134–144)
Total Protein: 7.2 g/dL (ref 6.0–8.5)

## 2018-11-13 LAB — CBC WITH DIFFERENTIAL/PLATELET
Basophils Absolute: 0 10*3/uL (ref 0.0–0.2)
Basos: 1 %
EOS (ABSOLUTE): 0 10*3/uL (ref 0.0–0.4)
Eos: 1 %
Hematocrit: 37.9 % (ref 34.0–46.6)
Hemoglobin: 12.6 g/dL (ref 11.1–15.9)
Immature Grans (Abs): 0 10*3/uL (ref 0.0–0.1)
Immature Granulocytes: 0 %
Lymphocytes Absolute: 1 10*3/uL (ref 0.7–3.1)
Lymphs: 23 %
MCH: 32.6 pg (ref 26.6–33.0)
MCHC: 33.2 g/dL (ref 31.5–35.7)
MCV: 98 fL — ABNORMAL HIGH (ref 79–97)
Monocytes Absolute: 0.4 10*3/uL (ref 0.1–0.9)
Monocytes: 9 %
Neutrophils Absolute: 2.9 10*3/uL (ref 1.4–7.0)
Neutrophils: 66 %
Platelets: 243 10*3/uL (ref 150–450)
RBC: 3.86 x10E6/uL (ref 3.77–5.28)
RDW: 12.3 % (ref 11.7–15.4)
WBC: 4.3 10*3/uL (ref 3.4–10.8)

## 2018-11-13 LAB — LIPID PANEL
Chol/HDL Ratio: 2.3 ratio (ref 0.0–4.4)
Cholesterol, Total: 155 mg/dL (ref 100–199)
HDL: 66 mg/dL (ref >39)
LDL Chol Calc (NIH): 77 mg/dL (ref 0–99)
Triglycerides: 62 mg/dL (ref 0–149)
VLDL Cholesterol Cal: 12 mg/dL (ref 5–40)

## 2018-11-13 LAB — TSH: TSH: 0.964 u[IU]/mL (ref 0.450–4.500)

## 2018-11-14 ENCOUNTER — Telehealth: Payer: Self-pay | Admitting: Physician Assistant

## 2018-11-15 NOTE — Telephone Encounter (Signed)
done

## 2018-11-16 NOTE — Telephone Encounter (Signed)
Patient's ref was sent to [facility] today 11/16/2018.

## 2018-12-31 ENCOUNTER — Inpatient Hospital Stay: Payer: Medicare HMO | Attending: Nurse Practitioner

## 2018-12-31 ENCOUNTER — Encounter: Payer: Self-pay | Admitting: Nurse Practitioner

## 2018-12-31 ENCOUNTER — Inpatient Hospital Stay: Payer: Medicare HMO | Admitting: Nurse Practitioner

## 2018-12-31 ENCOUNTER — Other Ambulatory Visit: Payer: Self-pay

## 2018-12-31 VITALS — BP 156/72 | HR 77 | Temp 98.2°F | Resp 17 | Ht 64.0 in | Wt 198.0 lb

## 2018-12-31 DIAGNOSIS — Z79899 Other long term (current) drug therapy: Secondary | ICD-10-CM | POA: Insufficient documentation

## 2018-12-31 DIAGNOSIS — Z791 Long term (current) use of non-steroidal anti-inflammatories (NSAID): Secondary | ICD-10-CM | POA: Insufficient documentation

## 2018-12-31 DIAGNOSIS — E785 Hyperlipidemia, unspecified: Secondary | ICD-10-CM | POA: Insufficient documentation

## 2018-12-31 DIAGNOSIS — I10 Essential (primary) hypertension: Secondary | ICD-10-CM | POA: Diagnosis not present

## 2018-12-31 DIAGNOSIS — C50411 Malignant neoplasm of upper-outer quadrant of right female breast: Secondary | ICD-10-CM | POA: Diagnosis not present

## 2018-12-31 DIAGNOSIS — F419 Anxiety disorder, unspecified: Secondary | ICD-10-CM | POA: Diagnosis not present

## 2018-12-31 DIAGNOSIS — Z923 Personal history of irradiation: Secondary | ICD-10-CM | POA: Insufficient documentation

## 2018-12-31 DIAGNOSIS — Z853 Personal history of malignant neoplasm of breast: Secondary | ICD-10-CM

## 2018-12-31 DIAGNOSIS — Z7982 Long term (current) use of aspirin: Secondary | ICD-10-CM | POA: Insufficient documentation

## 2018-12-31 DIAGNOSIS — Z170 Estrogen receptor positive status [ER+]: Secondary | ICD-10-CM

## 2018-12-31 DIAGNOSIS — Z1231 Encounter for screening mammogram for malignant neoplasm of breast: Secondary | ICD-10-CM

## 2018-12-31 DIAGNOSIS — Z9221 Personal history of antineoplastic chemotherapy: Secondary | ICD-10-CM | POA: Insufficient documentation

## 2018-12-31 DIAGNOSIS — C50919 Malignant neoplasm of unspecified site of unspecified female breast: Secondary | ICD-10-CM

## 2018-12-31 DIAGNOSIS — R69 Illness, unspecified: Secondary | ICD-10-CM | POA: Diagnosis not present

## 2018-12-31 LAB — COMPREHENSIVE METABOLIC PANEL
ALT: 20 U/L (ref 0–44)
AST: 22 U/L (ref 15–41)
Albumin: 3.8 g/dL (ref 3.5–5.0)
Alkaline Phosphatase: 56 U/L (ref 38–126)
Anion gap: 6 (ref 5–15)
BUN: 16 mg/dL (ref 8–23)
CO2: 31 mmol/L (ref 22–32)
Calcium: 9.2 mg/dL (ref 8.9–10.3)
Chloride: 105 mmol/L (ref 98–111)
Creatinine, Ser: 0.94 mg/dL (ref 0.44–1.00)
GFR calc Af Amer: >60 mL/min (ref >60)
GFR calc non Af Amer: 59 mL/min — ABNORMAL LOW (ref >60)
Glucose, Bld: 100 mg/dL — ABNORMAL HIGH (ref 70–99)
Potassium: 3.5 mmol/L (ref 3.5–5.1)
Sodium: 142 mmol/L (ref 135–145)
Total Bilirubin: 1.3 mg/dL — ABNORMAL HIGH (ref 0.3–1.2)
Total Protein: 7 g/dL (ref 6.5–8.1)

## 2018-12-31 LAB — CBC WITH DIFFERENTIAL/PLATELET
Abs Immature Granulocytes: 0 10*3/uL (ref 0.00–0.07)
Basophils Absolute: 0 10*3/uL (ref 0.0–0.1)
Basophils Relative: 1 %
Eosinophils Absolute: 0 10*3/uL (ref 0.0–0.5)
Eosinophils Relative: 1 %
HCT: 35.9 % — ABNORMAL LOW (ref 36.0–46.0)
Hemoglobin: 11.9 g/dL — ABNORMAL LOW (ref 12.0–15.0)
Immature Granulocytes: 0 %
Lymphocytes Relative: 20 %
Lymphs Abs: 0.8 10*3/uL (ref 0.7–4.0)
MCH: 33.1 pg (ref 26.0–34.0)
MCHC: 33.1 g/dL (ref 30.0–36.0)
MCV: 100 fL (ref 80.0–100.0)
Monocytes Absolute: 0.4 10*3/uL (ref 0.1–1.0)
Monocytes Relative: 10 %
Neutro Abs: 2.7 10*3/uL (ref 1.7–7.7)
Neutrophils Relative %: 68 %
Platelets: 232 10*3/uL (ref 150–400)
RBC: 3.59 MIL/uL — ABNORMAL LOW (ref 3.87–5.11)
RDW: 12.9 % (ref 11.5–15.5)
WBC: 4 10*3/uL (ref 4.0–10.5)
nRBC: 0 % (ref 0.0–0.2)

## 2018-12-31 NOTE — Progress Notes (Signed)
[facility]   [phone] [phone]   Clinic Follow up Note   Patient Care Team: Theodoro Clock as PCP - General (Physician Assistant) Gordy Levan, MD (Hematology and Oncology) 12/31/2018  CHIEF COMPLAINT: F/u bilateral breast cancer   ONCOLOGY HISTORY  1. 1998 left T2 with micrometastatic involvement of sentinel node, treated with lumpectomy and axillary node evaluation, chemo/RT/tamoxifen (only for a few years).  2. Spring 2010 right T1bN2 (6 of 23 nodes involved) invasive ductal ER/PR + and HER 2 - treated with lumpectomy + axillary nodes, adjuvant adria/cytoxan/taxol, radiation and aromatase inhibitor, anastrozole.   CURRENT THERAPY: Surveillance  INTERVAL HISTORY: Ms. Pies returns for f/u as scheduled. She had mammogram and DEXA in 06/2018. She had a tough year dealing with cataract surgeries, kidney infection, pneumonia and subsequent weight loss from diarrhea related to antibiotics, and now a UTI. She lost multiple friends to [place]. She feels like she is recovering from her health issues. She feels more anxious lately, takes lexapro and ativan PRN. She spent some time this year living with her daughter. Appetite and energy have improved lately, she gained some weight back. She has occasional aches in her knees and ribs ongoing for several years. No new pain. Denies tylenol or NSAID use. Denies concerns in her breasts such as new lump, nipple change/inversion, or skin issues. She had vaginal spotting once, she attributed to scratching surface of thin vaginal tissue and recent UTI. Denies GI bleeding or change in bowel habits. Sinus drainage is stable, no fever, chills, cough, chest pain, or dyspnea. Continues f/u with PCP. Her gyn is retiring soon.    MEDICAL HISTORY:  Past Medical History:  Diagnosis Date  . Anxiety   . Arthritis   . Breast cancer (HCC)    X2  . Cervical dysplasia   . GERD (gastroesophageal reflux disease)    takes otc meds  . Glaucoma   . Hyperlipidemia   . Hypertension   . Lichen sclerosus   . Personal history of chemotherapy 2010  . Personal history of radiation therapy 1999  . Shingles 2012    SURGICAL HISTORY: Past Surgical History:  Procedure Laterality Date  . BREAST BIOPSY Right 2010  . BREAST LUMPECTOMY Right 2010  . BREAST LUMPECTOMY Left   . BREAST SURGERY     Lumpectomy right and left  . GYNECOLOGIC CRYOSURGERY    . HYSTEROSCOPY    . PORT-A-CATH REMOVAL Left 01/31/2014   Procedure: REMOVAL PORT-A-CATH;  Surgeon: Autumn Messing III, MD;  Location: [facility];  Service: General;  Laterality: Left;  . TUBAL LIGATION      I have reviewed the social history and family history with the patient and they are unchanged from previous note.  ALLERGIES:  has No Known Allergies.  MEDICATIONS:  Current Outpatient Medications  Medication Sig Dispense Refill  . aspirin 81 MG tablet Take 81 mg by mouth daily.      . Brinzolamide-Brimonidine (SIMBRINZA) 1-0.2 % SUSP Apply to eye.    . Calcium Carbonate-Vitamin D (CALCIUM + D PO) Take 2 capsules by mouth daily.     . cefdinir (OMNICEF) 300 MG capsule Take 300 mg by mouth 2 (two) times daily. Take 1 tab twice a day    . cetirizine (ZYRTEC) 10 MG tablet Take 1 tablet (10 mg total) by mouth daily. 90 tablet 3  . clobetasol cream (TEMOVATE) 0.05 % Apply nightly as needed for itching 30 g 2  . docusate sodium (STOOL SOFTENER)  100 MG capsule Take 100 mg by mouth 2 (two) times daily as needed for mild constipation.    Marland Kitchen escitalopram (LEXAPRO) 10 MG tablet Take 1 tablet (10 mg total) by mouth daily. 90 tablet 3  . fluticasone (FLONASE) 50 MCG/ACT nasal spray Place 2 sprays into both nostrils daily. 48 g 3  . KLOR-CON M20 20 MEQ tablet Take 1 tablet (20 mEq total) by mouth 2 (two) times daily. 180 tablet 3  . latanoprost (XALATAN) 0.005 % ophthalmic solution 1 drop at bedtime.    Marland Kitchen LORazepam (ATIVAN) 0.5 MG tablet Take 0.5 mg by mouth every 8 (eight) hours  as needed. for anxiety    . losartan-hydrochlorothiazide (HYZAAR) 100-25 MG tablet Take 1 tablet by mouth daily. 90 tablet 3  . meloxicam (MOBIC) 15 MG tablet Take 1 tablet (15 mg total) by mouth daily. 90 tablet 1  . metoprolol succinate (TOPROL-XL) 25 MG 24 hr tablet Take 0.5 tablets (12.5 mg total) by mouth daily. Blood pressure and heart rate 90 tablet 3  . Omega-3 Fatty Acids (FISH OIL PO) Take 1 capsule by mouth daily.     . psyllium (METAMUCIL) 58.6 % powder Take 1 packet by mouth as needed.    . rosuvastatin (CRESTOR) 10 MG tablet Take 1 tablet (10 mg total) by mouth daily. 90 tablet 3   No current facility-administered medications for this visit.    Facility-Administered Medications Ordered in Other Visits  Medication Dose Route Frequency Provider Last Rate Last Dose  . sodium chloride 0.9 % injection 10 mL  10 mL Intravenous PRN Livesay, Lennis P, MD   10 mL at 02/27/12 1132    PHYSICAL EXAMINATION:  Vitals:   12/31/18 1045 12/31/18 1052  BP: (!) 163/80 (!) 156/72  Pulse: 77   Resp: 17   Temp: 98.2 F (36.8 C)   SpO2: 100%    Filed Weights   12/31/18 1045  Weight: 198 lb (89.8 kg)    GENERAL:alert, no distress and comfortable SKIN: no rash  EYES:  sclera clear LYMPH:  no palpable cervical or supraclavicular lymphadenopathy LUNGS: clear with normal breathing effort HEART: regular rate & rhythm, no lower extremity edema ABDOMEN: abdomen soft, non-tender and normal bowel sounds. No RUQ tenderness or hepatomegaly  Musculoskeletal:no cyanosis of digits and no clubbing. No focal tenderness to ribs or spine  NEURO: alert & oriented x 3 with fluent speech BREASTS: s/p bilat lumpectomies. No nipple discharge or inversion. Surgical scars completely healed. Scar tissue felt beneath right axillary incision. No palpable mass in either breast or axilla that I could appreciate   LABORATORY DATA:  I have reviewed the data as listed CBC Latest Ref Rng & Units 12/31/2018 11/12/2018  12/29/2017  WBC 4.0 - 10.5 K/uL 4.0 4.3 4.3  Hemoglobin 12.0 - 15.0 g/dL 11.9(L) 12.6 11.9(L)  Hematocrit 36.0 - 46.0 % 35.9(L) 37.9 35.5(L)  Platelets 150 - 400 K/uL 232 243 204     CMP Latest Ref Rng & Units 12/31/2018 11/12/2018 12/29/2017  Glucose 70 - 99 mg/dL 100(H) 105(H) 104(H)  BUN 8 - 23 mg/dL _0 Creatinine 0.44 - 1.00 mg/dL 0.94 1.04(H) 0.93  Sodium 135 - 145 mmol/L 142 140 142  Potassium 3.5 - 5.1 mmol/L 3.5 4.2 4.1  Chloride 98 - 111 mmol/L 105 102 105  CO2 22 - 32 mmol/L _1 Calcium 8.9 - 10.3 mg/dL 9.2 9.9 9.1  Total Protein 6.5 - 8.1 g/dL 7.0 7.2 7.1  Total Bilirubin 0.3 -  1.2 mg/dL 1.3(H) 1.5(H) 1.0  Alkaline Phos 38 - 126 U/L 56 63 69  AST 15 - 41 U/L _0 ALT 0 - 44 U/L _1 RADIOGRAPHIC STUDIES: I have personally reviewed the radiological images as listed and agreed with the findings in the report. No results found.   ASSESSMENT & PLAN:  ANGEE GUPTON is a 78 y.o. female with    1. Right upper outer quadrant breast invasive ductal carcinoma, stage IIIA, ER and PR positive, HER-2 negative -Diagnosed in 2010, s/p right breast lumpectomy, adjuvant AC-T, radiation and Anastrozole for 8 years.  -She has been on surveillance, she is clinically doing well. We reviewed mammogram from 06/2018 which is negative. Breast exam is benign today. Labs normal except borderline Hg 11.9, same as last year, and elevated bili 1.3 which has fluctuated since 2016. No other transaminitis or RUQ tenderness  -no clinical concern for breast cancer recurrence. Continue surveillance -she will have annual screening mammogram in 06/2019  -She prefers to continue f/u in our clinic. She will return for lab and f/u in 1 year -continue f/u with PCP, GYN, and GI in the meantime. She is overdue for colonoscopy and needs a new GYN as hers is retiring   2. Bone Health  -Her 03/2015 and 06/2018 DEXA were normal -continue calcium and vitamin D  3. Anxiety -worse in  2020 due to her own health issues and COVID19, she lost few friends to the virus and complications -takes lexapro and ativan PRN. I educated her to take lexapro consistently daily to improve efficacy, and use ativan PRN. She agrees.  -offered active listening and emotional encouragement -she has strong family support   4. HTN, arthritis -f/u with PCP -On Hyzarr -BP 163/80 today  PLAN: -Labs, mammogram, DEXA reviewed -Continue breast cancer surveillance -Calcium and vitamin D for bone health -Counseled to take lexapro daily and continue ativan PRN for anxiety  -Continue f/u with other providers  -Lab, f/u with Dr. Burr Medico in 1 year -Mammogram in 06/2019  Orders Placed This Encounter  Procedures  . MM 3D SCREEN BREAST BILATERAL    Standing Status:   Future    Standing Expiration Date:   03/02/2020    Order Specific Question:   Reason for Exam (SYMPTOM  OR DIAGNOSIS REQUIRED)    Answer:   h/o bilateral breast cancer    Order Specific Question:   Preferred imaging location?    Answer:   [facility]    All questions were answered. The patient knows to call the clinic with any problems, questions or concerns. No barriers to learning was detected. I spent 20 minutes counseling the patient face to face. The total time spent in the appointment was 25 minutes and more than 50% was on counseling and review of test results     Alla Feeling, NP 12/31/18

## 2019-01-01 ENCOUNTER — Telehealth: Payer: Self-pay | Admitting: Nurse Practitioner

## 2019-01-01 NOTE — Telephone Encounter (Signed)
Scheduled appt per 12/7 los.  Was not able to get in contact with the pt. A calendar will be mailed out

## 2019-01-02 ENCOUNTER — Other Ambulatory Visit: Payer: Self-pay | Admitting: *Deleted

## 2019-01-02 MED ORDER — KLOR-CON M20 20 MEQ PO TBCR: 20.0000 meq | EXTENDED_RELEASE_TABLET | Freq: Two times a day (BID) | ORAL | 2 refills | Status: DC

## 2019-04-18 DIAGNOSIS — H40009 Preglaucoma, unspecified, unspecified eye: Secondary | ICD-10-CM | POA: Diagnosis not present

## 2019-04-18 DIAGNOSIS — I10 Essential (primary) hypertension: Secondary | ICD-10-CM | POA: Diagnosis not present

## 2019-04-18 DIAGNOSIS — Z0100 Encounter for examination of eyes and vision without abnormal findings: Secondary | ICD-10-CM | POA: Diagnosis not present

## 2019-04-18 DIAGNOSIS — H401130 Primary open-angle glaucoma, bilateral, stage unspecified: Secondary | ICD-10-CM | POA: Diagnosis not present

## 2019-04-18 DIAGNOSIS — H33101 Unspecified retinoschisis, right eye: Secondary | ICD-10-CM | POA: Diagnosis not present

## 2019-04-18 DIAGNOSIS — E7800 Pure hypercholesterolemia, unspecified: Secondary | ICD-10-CM | POA: Diagnosis not present

## 2019-04-18 DIAGNOSIS — Z961 Presence of intraocular lens: Secondary | ICD-10-CM | POA: Diagnosis not present

## 2019-04-18 DIAGNOSIS — H52209 Unspecified astigmatism, unspecified eye: Secondary | ICD-10-CM | POA: Diagnosis not present

## 2019-05-15 ENCOUNTER — Ambulatory Visit: Payer: Medicare HMO | Admitting: Physician Assistant

## 2019-05-17 ENCOUNTER — Other Ambulatory Visit: Payer: Medicare HMO

## 2019-05-17 ENCOUNTER — Other Ambulatory Visit: Payer: Self-pay

## 2019-05-17 DIAGNOSIS — I10 Essential (primary) hypertension: Secondary | ICD-10-CM

## 2019-05-17 DIAGNOSIS — E782 Mixed hyperlipidemia: Secondary | ICD-10-CM | POA: Diagnosis not present

## 2019-05-18 LAB — CMP14+EGFR
ALT: 16 IU/L (ref 0–32)
AST: 21 IU/L (ref 0–40)
Albumin/Globulin Ratio: 1.6 (ref 1.2–2.2)
Albumin: 4.3 g/dL (ref 3.7–4.7)
Alkaline Phosphatase: 71 IU/L (ref 39–117)
BUN/Creatinine Ratio: 14 (ref 12–28)
BUN: 13 mg/dL (ref 8–27)
Bilirubin Total: 1.3 mg/dL — ABNORMAL HIGH (ref 0.0–1.2)
CO2: 25 mmol/L (ref 20–29)
Calcium: 9.7 mg/dL (ref 8.7–10.3)
Chloride: 105 mmol/L (ref 96–106)
Creatinine, Ser: 0.92 mg/dL (ref 0.57–1.00)
GFR calc Af Amer: 69 mL/min/{1.73_m2} (ref >59)
GFR calc non Af Amer: 60 mL/min/{1.73_m2} (ref >59)
Globulin, Total: 2.7 g/dL (ref 1.5–4.5)
Glucose: 90 mg/dL (ref 65–99)
Potassium: 4 mmol/L (ref 3.5–5.2)
Sodium: 144 mmol/L (ref 134–144)
Total Protein: 7 g/dL (ref 6.0–8.5)

## 2019-05-18 LAB — CBC WITH DIFFERENTIAL/PLATELET
Basophils Absolute: 0 10*3/uL (ref 0.0–0.2)
Basos: 1 %
EOS (ABSOLUTE): 0 10*3/uL (ref 0.0–0.4)
Eos: 1 %
Hematocrit: 39.3 % (ref 34.0–46.6)
Hemoglobin: 13.1 g/dL (ref 11.1–15.9)
Immature Grans (Abs): 0 10*3/uL (ref 0.0–0.1)
Immature Granulocytes: 0 %
Lymphocytes Absolute: 1.1 10*3/uL (ref 0.7–3.1)
Lymphs: 25 %
MCH: 33.3 pg — ABNORMAL HIGH (ref 26.6–33.0)
MCHC: 33.3 g/dL (ref 31.5–35.7)
MCV: 100 fL — ABNORMAL HIGH (ref 79–97)
Monocytes Absolute: 0.5 10*3/uL (ref 0.1–0.9)
Monocytes: 10 %
Neutrophils Absolute: 2.8 10*3/uL (ref 1.4–7.0)
Neutrophils: 63 %
Platelets: 241 10*3/uL (ref 150–450)
RBC: 3.93 x10E6/uL (ref 3.77–5.28)
RDW: 12.3 % (ref 11.7–15.4)
WBC: 4.4 10*3/uL (ref 3.4–10.8)

## 2019-05-18 LAB — LIPID PANEL
Chol/HDL Ratio: 2.9 ratio (ref 0.0–4.4)
Cholesterol, Total: 170 mg/dL (ref 100–199)
HDL: 59 mg/dL (ref >39)
LDL Chol Calc (NIH): 94 mg/dL (ref 0–99)
Triglycerides: 96 mg/dL (ref 0–149)
VLDL Cholesterol Cal: 17 mg/dL (ref 5–40)

## 2019-05-20 ENCOUNTER — Other Ambulatory Visit: Payer: Self-pay | Admitting: Family

## 2019-05-20 MED ORDER — ROSUVASTATIN CALCIUM 10 MG PO TABS: 10.0000 mg | ORAL_TABLET | Freq: Every day | ORAL | 3 refills | Status: DC

## 2019-05-20 MED ORDER — ATORVASTATIN CALCIUM 20 MG PO TABS: 20.0000 mg | ORAL_TABLET | Freq: Every day | ORAL | 3 refills | Status: DC

## 2019-05-21 ENCOUNTER — Encounter: Payer: Self-pay | Admitting: Family

## 2019-05-21 ENCOUNTER — Ambulatory Visit (INDEPENDENT_AMBULATORY_CARE_PROVIDER_SITE_OTHER): Payer: Medicare HMO | Admitting: Family

## 2019-05-21 ENCOUNTER — Other Ambulatory Visit: Payer: Self-pay

## 2019-05-21 VITALS — BP 141/70 | HR 70 | Temp 98.2°F | Ht 64.0 in | Wt 200.0 lb

## 2019-05-21 DIAGNOSIS — E782 Mixed hyperlipidemia: Secondary | ICD-10-CM

## 2019-05-21 DIAGNOSIS — Z1212 Encounter for screening for malignant neoplasm of rectum: Secondary | ICD-10-CM

## 2019-05-21 DIAGNOSIS — F419 Anxiety disorder, unspecified: Secondary | ICD-10-CM | POA: Diagnosis not present

## 2019-05-21 DIAGNOSIS — Z1211 Encounter for screening for malignant neoplasm of colon: Secondary | ICD-10-CM

## 2019-05-21 DIAGNOSIS — I10 Essential (primary) hypertension: Secondary | ICD-10-CM | POA: Diagnosis not present

## 2019-05-21 DIAGNOSIS — Z170 Estrogen receptor positive status [ER+]: Secondary | ICD-10-CM | POA: Diagnosis not present

## 2019-05-21 DIAGNOSIS — Z23 Encounter for immunization: Secondary | ICD-10-CM

## 2019-05-21 DIAGNOSIS — F320 Major depressive disorder, single episode, mild: Secondary | ICD-10-CM

## 2019-05-21 DIAGNOSIS — Z853 Personal history of malignant neoplasm of breast: Secondary | ICD-10-CM

## 2019-05-21 DIAGNOSIS — C50411 Malignant neoplasm of upper-outer quadrant of right female breast: Secondary | ICD-10-CM

## 2019-05-21 DIAGNOSIS — R69 Illness, unspecified: Secondary | ICD-10-CM | POA: Diagnosis not present

## 2019-05-21 NOTE — Addendum Note (Signed)
Addended by: Ladean Raya on: 05/21/2019 02:58 PM   Modules accepted: Orders

## 2019-05-21 NOTE — Progress Notes (Signed)
Subjective:    Patient ID: Alison [name], female    DOB: [birth year], 79 y.o.   MRN: [id number]  Chief Complaint  Patient presents with  . Medical Management of Chronic Issues    Alison [name] patient    PT presents to the office today to establish care with me. She is followed by Oncologists for Left breast cancer in 1998 and right breast cancer 2010.  Hypertension This is a chronic problem. The current episode started more than 1 year ago. The problem has been resolved since onset. The problem is controlled. Associated symptoms include anxiety. Pertinent negatives include no malaise/fatigue, peripheral edema or shortness of breath. Risk factors for coronary artery disease include dyslipidemia, obesity and sedentary lifestyle. The current treatment provides moderate improvement.  Hyperlipidemia This is a chronic problem. The current episode started more than 1 year ago. The problem is controlled. Recent lipid tests were reviewed and are normal. Exacerbating diseases include obesity. Pertinent negatives include no shortness of breath. Current antihyperlipidemic treatment includes statins. The current treatment provides moderate improvement of lipids. Risk factors for coronary artery disease include dyslipidemia, hypertension and a sedentary lifestyle.  Anxiety Presents for follow-up visit. Symptoms include depressed mood, excessive worry, irritability, nervous/anxious behavior and restlessness. Patient reports no shortness of breath. Symptoms occur occasionally. The severity of symptoms is moderate. The quality of sleep is good.        Review of Systems  Constitutional: Positive for irritability. Negative for malaise/fatigue.  Respiratory: Negative for shortness of breath.   Psychiatric/Behavioral: The patient is nervous/anxious.   All other systems reviewed and are negative.      Objective:   Physical Exam Vitals reviewed.  Constitutional:      General: She is not in acute distress.  Appearance: She is well-developed.  HENT:     Head: Normocephalic and atraumatic.     Right Ear: Tympanic membrane normal.     Left Ear: Tympanic membrane normal.  Eyes:     Pupils: Pupils are equal, round, and reactive to light.  Neck:     Thyroid: No thyromegaly.  Cardiovascular:     Rate and Rhythm: Normal rate and regular rhythm.     Heart sounds: Normal heart sounds. No murmur.  Pulmonary:     Effort: Pulmonary effort is normal. No respiratory distress.     Breath sounds: Normal breath sounds. No wheezing.  Abdominal:     General: Bowel sounds are normal. There is no distension.     Palpations: Abdomen is soft.     Tenderness: There is no abdominal tenderness.  Musculoskeletal:        General: No tenderness. Normal range of motion.     Cervical back: Normal range of motion and neck supple.  Skin:    General: Skin is warm and dry.  Neurological:     Mental Status: She is alert and oriented to person, place, and time.     Cranial Nerves: No cranial nerve deficit.     Deep Tendon Reflexes: Reflexes are normal and symmetric.  Psychiatric:        Behavior: Behavior normal.        Thought Content: Thought content normal.        Judgment: Judgment normal.       BP (!) 141/70   Pulse 70   Temp 98.2 F (36.8 C)   Ht 5\' 4"  (1.626 m)   Wt 200 lb (90.7 kg)   LMP 01/25/2004   SpO2 99%   BMI  34.33 kg/m      Assessment & Plan:  Alison [name] comes in today with chief complaint of Medical Management of Chronic Issues Alison [name] patient )   Diagnosis and orders addressed:  1. Hypertension with goal blood pressure less than 130/80  2. Anxiety  3. Moderate mixed hyperlipidemia not requiring statin therapy  4. History of left breast cancer  5. Depression, major, single episode, mild (Alison [name])  6. Malignant neoplasm of upper-outer quadrant of right breast in female, estrogen receptor positive (Alison [name])  7. Colon cancer screening - Cologuard  8. Screening for malignant  neoplasm of the rectum - Cologuard   Labs reviewed Health Maintenance reviewed- TDAP given today Diet and exercise encouraged  Follow up plan: 6 months    Evelina Dun, FNP

## 2019-05-21 NOTE — Patient Instructions (Signed)
Health Maintenance After Age 78 After age 78, you are at a higher risk for certain long-term diseases and infections as well as injuries from falls. Falls are a major cause of broken bones and head injuries in people who are older than age 78. Getting regular preventive care can help to keep you healthy and well. Preventive care includes getting regular testing and making lifestyle changes as recommended by your health care provider. Talk with your health care provider about:  Which screenings and tests you should have. A screening is a test that checks for a disease when you have no symptoms.  A diet and exercise plan that is right for you. What should I know about screenings and tests to prevent falls? Screening and testing are the best ways to find a health problem early. Early diagnosis and treatment give you the best chance of managing medical conditions that are common after age 78. Certain conditions and lifestyle choices may make you more likely to have a fall. Your health care provider may recommend:  Regular vision checks. Poor vision and conditions such as cataracts can make you more likely to have a fall. If you wear glasses, make sure to get your prescription updated if your vision changes.  Medicine review. Work with your health care provider to regularly review all of the medicines you are taking, including over-the-counter medicines. Ask your health care provider about any side effects that may make you more likely to have a fall. Tell your health care provider if any medicines that you take make you feel dizzy or sleepy.  Osteoporosis screening. Osteoporosis is a condition that causes the bones to get weaker. This can make the bones weak and cause them to break more easily.  Blood pressure screening. Blood pressure changes and medicines to control blood pressure can make you feel dizzy.  Strength and balance checks. Your health care provider may recommend certain tests to check your  strength and balance while standing, walking, or changing positions.  Foot health exam. Foot pain and numbness, as well as not wearing proper footwear, can make you more likely to have a fall.  Depression screening. You may be more likely to have a fall if you have a fear of falling, feel emotionally low, or feel unable to do activities that you used to do.  Alcohol use screening. Using too much alcohol can affect your balance and may make you more likely to have a fall. What actions can I take to lower my risk of falls? General instructions  Talk with your health care provider about your risks for falling. Tell your health care provider if: ? You fall. Be sure to tell your health care provider about all falls, even ones that seem minor. ? You feel dizzy, sleepy, or off-balance.  Take over-the-counter and prescription medicines only as told by your health care provider. These include any supplements.  Eat a healthy diet and maintain a healthy weight. A healthy diet includes low-fat dairy products, low-fat (lean) meats, and fiber from whole grains, beans, and lots of fruits and vegetables. Home safety  Remove any tripping hazards, such as rugs, cords, and clutter.  Install safety equipment such as grab bars in bathrooms and safety rails on stairs.  Keep rooms and walkways well-lit. Activity   Follow a regular exercise program to stay fit. This will help you maintain your balance. Ask your health care provider what types of exercise are appropriate for you.  If you need a cane or   walker, use it as recommended by your health care provider.  Wear supportive shoes that have nonskid soles. Lifestyle  Do not drink alcohol if your health care provider tells you not to drink.  If you drink alcohol, limit how much you have: ? 0-1 drink a day for women. ? 0-2 drinks a day for men.  Be aware of how much alcohol is in your drink. In the [place] one drink equals one typical bottle of beer (12  oz), one-half glass of wine (5 oz), or one shot of hard liquor (1 oz).  Do not use any products that contain nicotine or tobacco, such as cigarettes and e-cigarettes. If you need help quitting, ask your health care provider. Summary  Having a healthy lifestyle and getting preventive care can help to protect your health and wellness after age 78.  Screening and testing are the best way to find a health problem early and help you avoid having a fall. Early diagnosis and treatment give you the best chance for managing medical conditions that are more common for people who are older than age 78.  Falls are a major cause of broken bones and head injuries in people who are older than age 78. Take precautions to prevent a fall at home.  Work with your health care provider to learn what changes you can make to improve your health and wellness and to prevent falls. This information is not intended to replace advice given to you by your health care provider. Make sure you discuss any questions you have with your health care provider. Document Revised: 05/03/2018 Document Reviewed: 11/23/2016 Elsevier Patient Education  2020 [employer]  

## 2019-06-03 DIAGNOSIS — Z1211 Encounter for screening for malignant neoplasm of colon: Secondary | ICD-10-CM | POA: Diagnosis not present

## 2019-06-04 DIAGNOSIS — Z79899 Other long term (current) drug therapy: Secondary | ICD-10-CM | POA: Diagnosis not present

## 2019-06-04 DIAGNOSIS — I10 Essential (primary) hypertension: Secondary | ICD-10-CM | POA: Diagnosis not present

## 2019-06-04 DIAGNOSIS — E669 Obesity, unspecified: Secondary | ICD-10-CM | POA: Diagnosis not present

## 2019-06-04 DIAGNOSIS — Z853 Personal history of malignant neoplasm of breast: Secondary | ICD-10-CM | POA: Diagnosis not present

## 2019-06-04 DIAGNOSIS — M1990 Unspecified osteoarthritis, unspecified site: Secondary | ICD-10-CM | POA: Diagnosis not present

## 2019-06-04 DIAGNOSIS — H409 Unspecified glaucoma: Secondary | ICD-10-CM | POA: Diagnosis not present

## 2019-06-04 DIAGNOSIS — J309 Allergic rhinitis, unspecified: Secondary | ICD-10-CM | POA: Diagnosis not present

## 2019-06-04 DIAGNOSIS — E785 Hyperlipidemia, unspecified: Secondary | ICD-10-CM | POA: Diagnosis not present

## 2019-06-04 DIAGNOSIS — Z791 Long term (current) use of non-steroidal anti-inflammatories (NSAID): Secondary | ICD-10-CM | POA: Diagnosis not present

## 2019-06-07 LAB — COLOGUARD: COLOGUARD: NEGATIVE

## 2019-06-13 LAB — COLOGUARD: Cologuard: NEGATIVE

## 2019-07-05 ENCOUNTER — Other Ambulatory Visit: Payer: Self-pay | Admitting: *Deleted

## 2019-07-05 MED ORDER — METOPROLOL SUCCINATE ER 25 MG PO TB24: 12.5000 mg | ORAL_TABLET | Freq: Every day | ORAL | 1 refills | Status: DC

## 2019-07-15 ENCOUNTER — Other Ambulatory Visit: Payer: Self-pay

## 2019-07-15 ENCOUNTER — Ambulatory Visit
Admission: RE | Admit: 2019-07-15 | Discharge: 2019-07-15 | Disposition: A | Discharge status: Home / Self Care | Payer: Medicare HMO | Source: Ambulatory Visit | Attending: Nurse Practitioner | Admitting: Nurse Practitioner

## 2019-07-15 DIAGNOSIS — Z1231 Encounter for screening mammogram for malignant neoplasm of breast: Secondary | ICD-10-CM | POA: Diagnosis not present

## 2019-07-27 IMAGING — DX DG LUMBAR SPINE 2-3V
3 series · 3 of 3 positions shown · non-contrast
Comparison: 06/16/2015

CLINICAL DATA: Right low back pain of unspecified chronicity

EXAM:
LUMBAR SPINE - 2-3 VIEW

[l-spine ap]
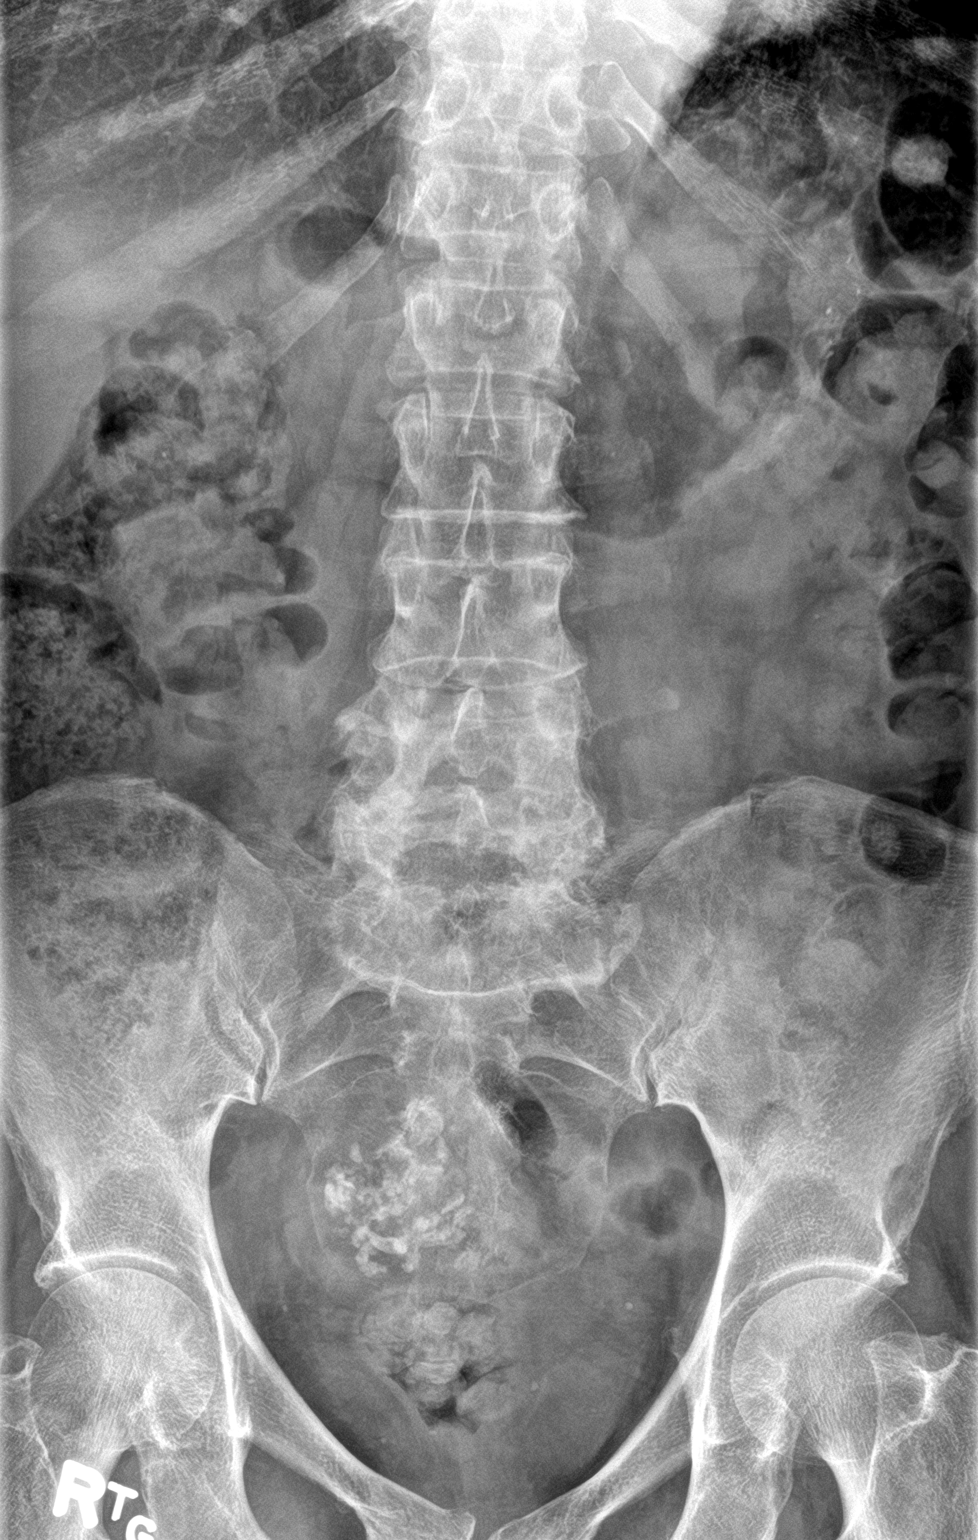

[l-spine lat]
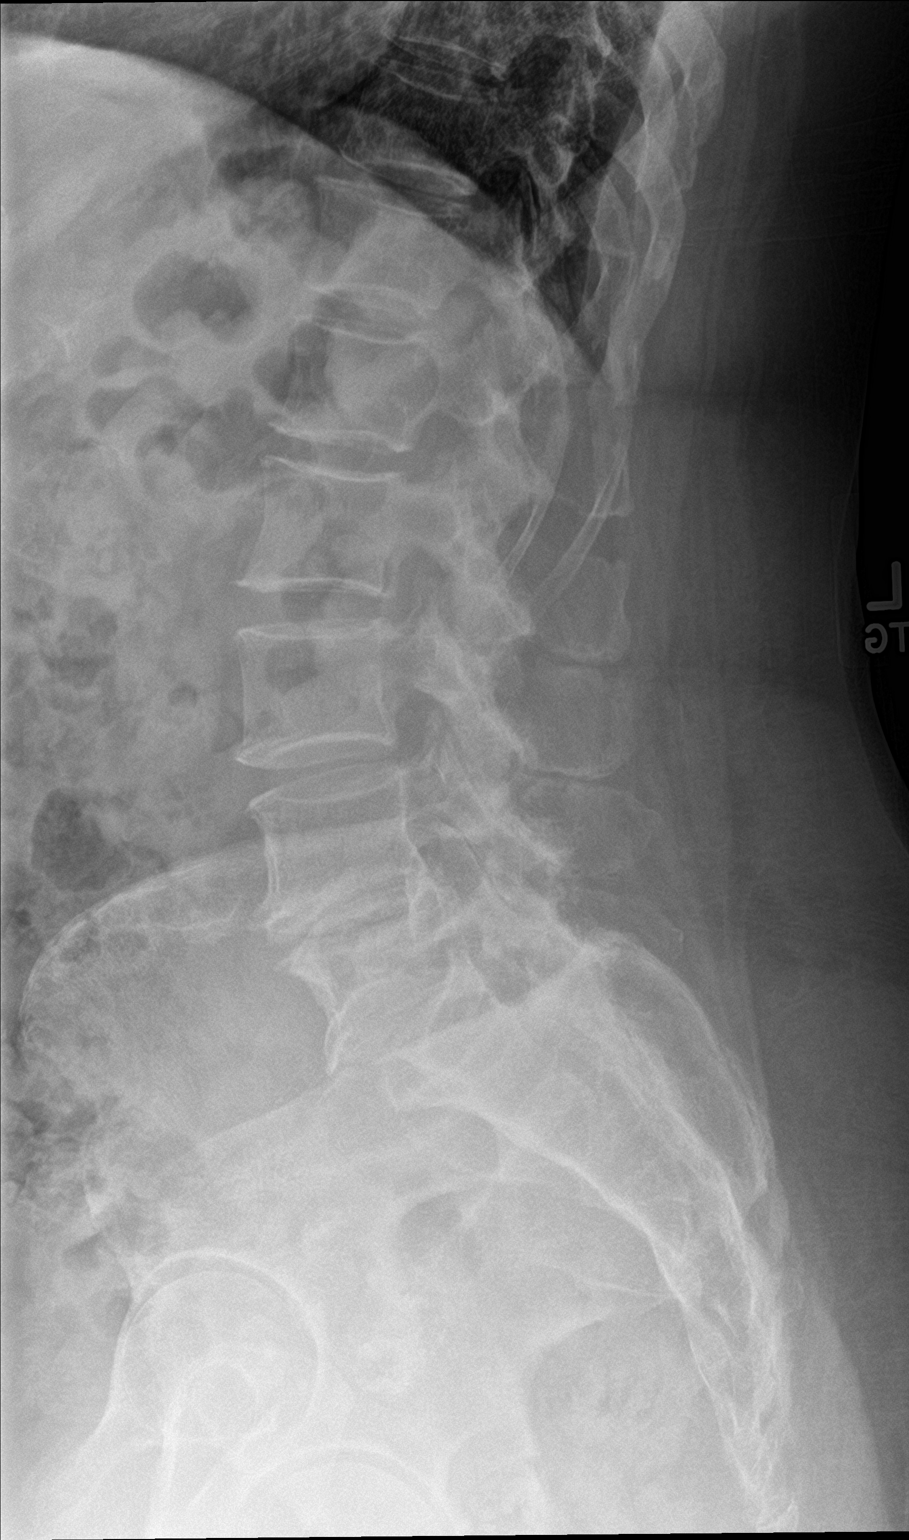

[l-spine spot]
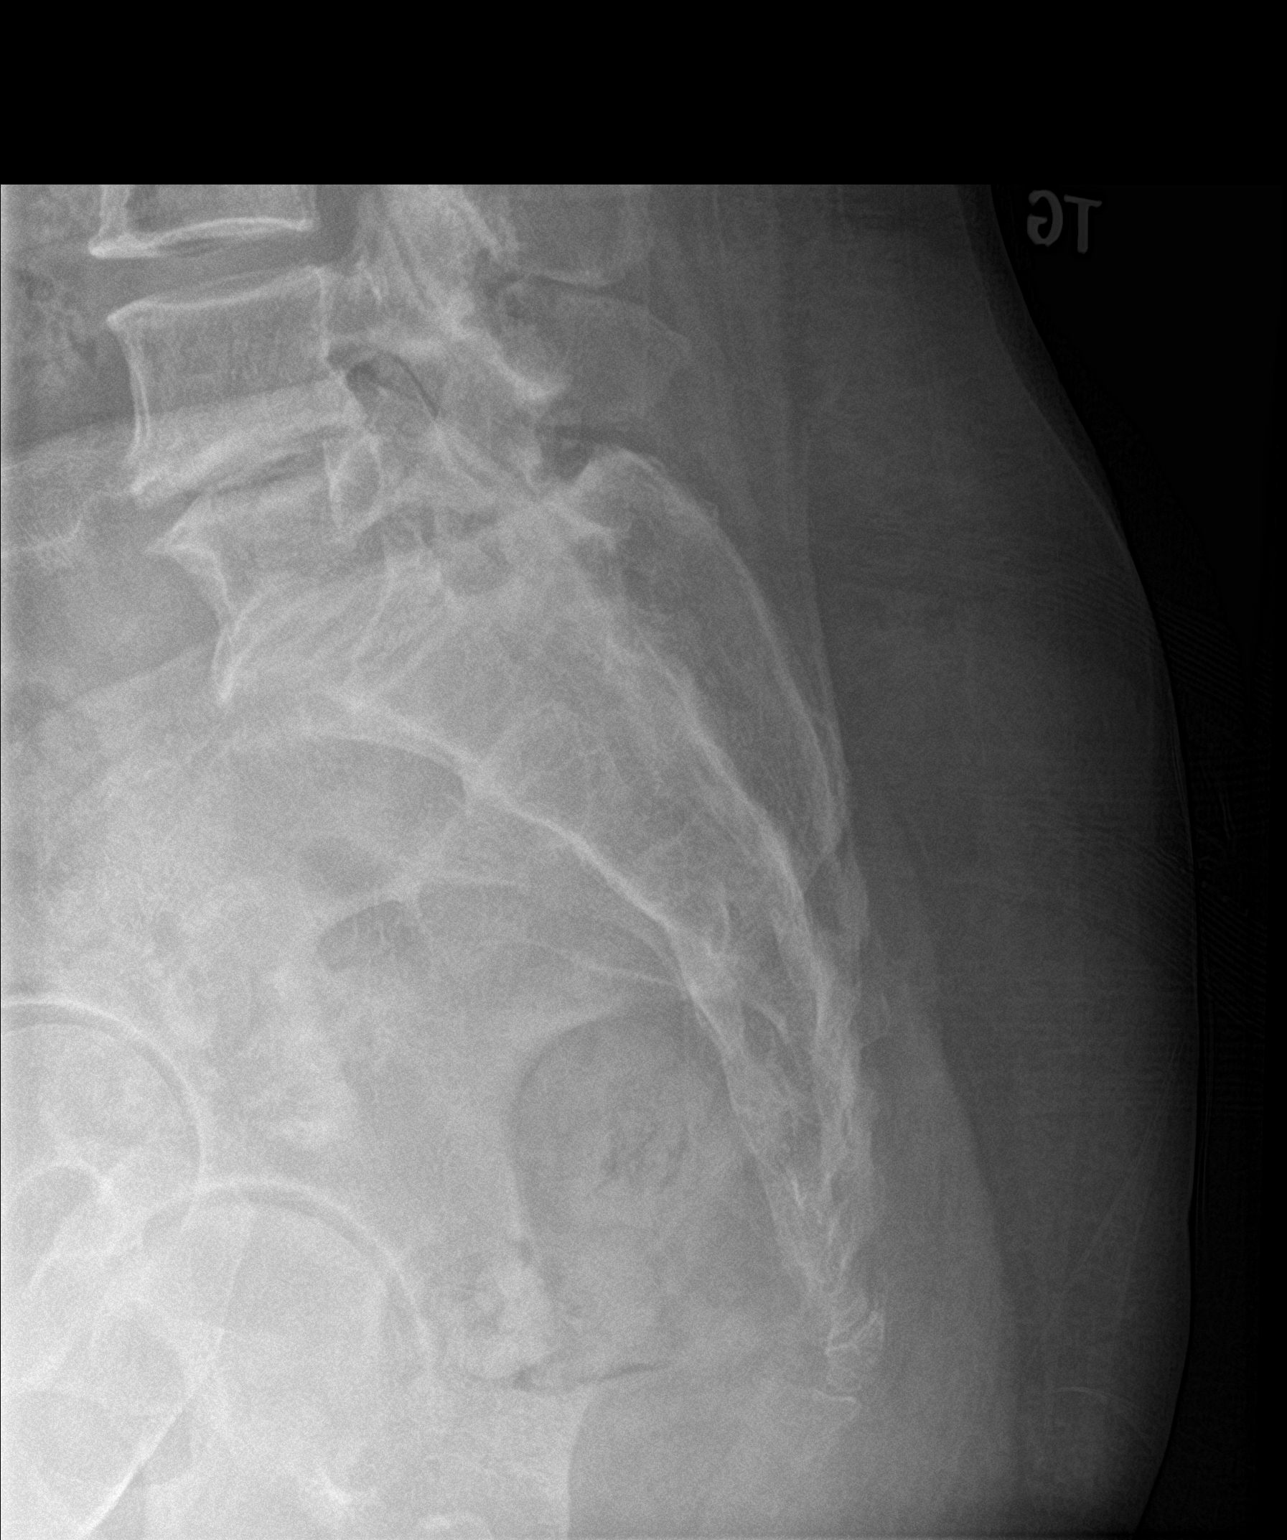

[3 of 3 positions shown; findings below may reference images not displayed]

FINDINGS: Lumbar facet spurring that is likely diffuse, most advanced at L4-5
and L5-S1 where there is also degenerative disc narrowing and
greatest endplate spurring. No evidence of fracture or bone lesion.

Coarse calcification in the pelvis correlating with fibroid on 4757
abdominal CT.

No evidence of fracture or bone lesion.
IMPRESSION: Generalized facet spurring most advanced at L4-5 and L5-S1. Advanced
disc narrowing at the same levels.

## 2019-07-31 ENCOUNTER — Telehealth: Payer: Self-pay | Admitting: *Deleted

## 2019-07-31 NOTE — Telephone Encounter (Signed)
Fax from [facility] on Meloxicam 15 mg 1 QD Not on current med list Last OV 05/21/19 no Dx Next OV 11/21/19 Please advise

## 2019-08-01 MED ORDER — MELOXICAM 15 MG PO TABS
15.0000 mg | ORAL_TABLET | Freq: Every day | ORAL | 1 refills | Status: DC
Start: 2019-08-01 — End: 2019-11-21

## 2019-08-01 NOTE — Telephone Encounter (Signed)
Prescription sent to pharmacy.

## 2019-08-01 NOTE — Addendum Note (Signed)
Addended by: Evelina Dun A on: 08/01/2019 01:50 PM   Modules accepted: Orders

## 2019-09-16 ENCOUNTER — Telehealth: Payer: Self-pay | Admitting: Family

## 2019-09-16 NOTE — Telephone Encounter (Signed)
lmtcb

## 2019-11-21 ENCOUNTER — Ambulatory Visit (INDEPENDENT_AMBULATORY_CARE_PROVIDER_SITE_OTHER): Payer: Medicare HMO | Admitting: Family

## 2019-11-21 ENCOUNTER — Other Ambulatory Visit: Payer: Self-pay

## 2019-11-21 ENCOUNTER — Encounter: Payer: Self-pay | Admitting: Family

## 2019-11-21 VITALS — BP 155/83 | HR 80 | Temp 96.0°F | Ht 64.0 in | Wt 198.0 lb

## 2019-11-21 DIAGNOSIS — F320 Major depressive disorder, single episode, mild: Secondary | ICD-10-CM | POA: Diagnosis not present

## 2019-11-21 DIAGNOSIS — E782 Mixed hyperlipidemia: Secondary | ICD-10-CM | POA: Diagnosis not present

## 2019-11-21 DIAGNOSIS — Z853 Personal history of malignant neoplasm of breast: Secondary | ICD-10-CM | POA: Diagnosis not present

## 2019-11-21 DIAGNOSIS — Z1159 Encounter for screening for other viral diseases: Secondary | ICD-10-CM | POA: Diagnosis not present

## 2019-11-21 DIAGNOSIS — M170 Bilateral primary osteoarthritis of knee: Secondary | ICD-10-CM

## 2019-11-21 DIAGNOSIS — F419 Anxiety disorder, unspecified: Secondary | ICD-10-CM

## 2019-11-21 DIAGNOSIS — R69 Illness, unspecified: Secondary | ICD-10-CM | POA: Diagnosis not present

## 2019-11-21 DIAGNOSIS — I10 Essential (primary) hypertension: Secondary | ICD-10-CM | POA: Diagnosis not present

## 2019-11-21 DIAGNOSIS — Z23 Encounter for immunization: Secondary | ICD-10-CM

## 2019-11-21 MED ORDER — METOPROLOL SUCCINATE ER 25 MG PO TB24: 12.5000 mg | ORAL_TABLET | Freq: Every day | ORAL | 1 refills | Status: DC

## 2019-11-21 MED ORDER — ESCITALOPRAM OXALATE 10 MG PO TABS: 10.0000 mg | ORAL_TABLET | Freq: Every day | ORAL | 3 refills | Status: DC

## 2019-11-21 MED ORDER — KLOR-CON M20 20 MEQ PO TBCR: 20.0000 meq | EXTENDED_RELEASE_TABLET | Freq: Two times a day (BID) | ORAL | 2 refills | Status: DC

## 2019-11-21 MED ORDER — MELOXICAM 15 MG PO TABS: 15.0000 mg | ORAL_TABLET | Freq: Every day | ORAL | 1 refills | Status: DC

## 2019-11-21 MED ORDER — FLUTICASONE PROPIONATE 50 MCG/ACT NA SUSP: 2.0000 | Freq: Every day | NASAL | 3 refills | Status: DC

## 2019-11-21 MED ORDER — LOSARTAN POTASSIUM-HCTZ 100-25 MG PO TABS: 1.0000 | ORAL_TABLET | Freq: Every day | ORAL | 3 refills | Status: DC

## 2019-11-21 MED ORDER — ROSUVASTATIN CALCIUM 10 MG PO TABS: 10.0000 mg | ORAL_TABLET | Freq: Every day | ORAL | 3 refills | Status: DC

## 2019-11-21 NOTE — Patient Instructions (Signed)
Major Depressive Disorder, Adult Major depressive disorder (MDD) is a mental health condition. It may also be called clinical depression or unipolar depression. MDD usually causes feelings of sadness, hopelessness, or helplessness. MDD can also cause physical symptoms. It can interfere with work, school, relationships, and other everyday activities. MDD may be mild, moderate, or severe. It may occur once (single episode major depressive disorder) or it may occur multiple times (recurrent major depressive disorder). What are the causes? The exact cause of this condition is not known. MDD is most likely caused by a combination of things, which may include:  Genetic factors. These are traits that are passed along from parent to child.  Individual factors. Your personality, your behavior, and the way you handle your thoughts and feelings may contribute to MDD. This includes personality traits and behaviors learned from others.  Physical factors, such as: ? Differences in the part of your brain that controls emotion. This part of your brain may be different than it is in people who do not have MDD. ? Long-term (chronic) medical or psychiatric illnesses.  Social factors. Traumatic experiences or major life changes may play a role in the development of MDD. What increases the risk? This condition is more likely to develop in women. The following factors may also make you more likely to develop MDD:  A family history of depression.  Troubled family relationships.  Abnormally low levels of certain brain chemicals.  Traumatic events in childhood, especially abuse or the loss of a parent.  Being under a lot of stress, or long-term stress, especially from upsetting life experiences or losses.  A history of: ? Chronic physical illness. ? Other mental health disorders. ? Substance abuse.  Poor living conditions.  Experiencing social exclusion or discrimination on a regular basis. What are the  signs or symptoms? The main symptoms of MDD typically include:  Constant depressed or irritable mood.  Loss of interest in things and activities. MDD symptoms may also include:  Sleeping or eating too much or too little.  Unexplained weight change.  Fatigue or low energy.  Feelings of worthlessness or guilt.  Difficulty thinking clearly or making decisions.  Thoughts of suicide or of harming others.  Physical agitation or weakness.  Isolation. Severe cases of MDD may also occur with other symptoms, such as:  Delusions or hallucinations, in which you imagine things that are not real (psychotic depression).  Low-level depression that lasts at least a year (chronic depression or persistent depressive disorder).  Extreme sadness and hopelessness (melancholic depression).  Trouble speaking and moving (catatonic depression). How is this diagnosed? This condition may be diagnosed based on:  Your symptoms.  Your medical history, including your mental health history. This may involve tests to evaluate your mental health. You may be asked questions about your lifestyle, including any drug and alcohol use, and how long you have had symptoms of MDD.  A physical exam.  Blood tests to rule out other conditions. You must have a depressed mood and at least four other MDD symptoms most of the day, nearly every day in the same 2-week timeframe before your health care provider can confirm a diagnosis of MDD. How is this treated? This condition is usually treated by mental health professionals, such as psychologists, psychiatrists, and clinical social workers. You may need more than one type of treatment. Treatment may include:  Psychotherapy. This is also called talk therapy or counseling. Types of psychotherapy include: ? Cognitive behavioral therapy (CBT). This type of therapy   teaches you to recognize unhealthy feelings, thoughts, and behaviors, and replace them with positive thoughts  and actions. ? Interpersonal therapy (IPT). This helps you to improve the way you relate to and communicate with others. ? Family therapy. This treatment includes members of your family.  Medicine to treat anxiety and depression, or to help you control certain emotions and behaviors.  Lifestyle changes, such as: ? Limiting alcohol and drug use. ? Exercising regularly. ? Getting plenty of sleep. ? Making healthy eating choices. ? Spending more time outdoors.  Treatments involving stimulation of the brain can be used in situations with extremely severe symptoms, or when medicine or other therapies do not work over time. These treatments include electroconvulsive therapy, transcranial magnetic stimulation, and vagal nerve stimulation. Follow these instructions at home: Activity  Return to your normal activities as told by your health care provider.  Exercise regularly and spend time outdoors as told by your health care provider. General instructions  Take over-the-counter and prescription medicines only as told by your health care provider.  Do not drink alcohol. If you drink alcohol, limit your alcohol intake to no more than 1 drink a day for nonpregnant women and 2 drinks a day for men. One drink equals 12 oz of beer, 5 oz of wine, or 1 oz of hard liquor. Alcohol can affect any antidepressant medicines you are taking. Talk to your health care provider about your alcohol use.  Eat a healthy diet and get plenty of sleep.  Find activities that you enjoy doing, and make time to do them.  Consider joining a support group. Your health care provider may be able to recommend a support group.  Keep all follow-up visits as told by your health care provider. This is important. Where to find more information National Alliance on Mental Illness  www.nami.org [place] National Institute of Mental Health  www.nimh.nih.gov National Suicide Prevention Lifeline  1-800-273-TALK (8255). This is  free, 24-hour help. Contact a health care provider if:  Your symptoms get worse.  You develop new symptoms. Get help right away if:  You self-harm.  You have serious thoughts about hurting yourself or others.  You see, hear, taste, smell, or feel things that are not present (hallucinate). This information is not intended to replace advice given to you by your health care provider. Make sure you discuss any questions you have with your health care provider. Document Revised: 12/23/2016 Document Reviewed: 07/22/2015 Elsevier Patient Education  2020 Elsevier Inc.  

## 2019-11-21 NOTE — Progress Notes (Signed)
Subjective:    Patient ID: Alison [name], female    DOB: July 22, 1940, 79 y.o.   MRN: [id number]  Chief Complaint  Patient presents with  . Medical Management of Chronic Issues  . Hypertension  . Shoulder Pain    BOTH SHOULDER PAIN, COULD NOT SLEEP LAST NIGHT    PT presents to the office today for chronic follow up. She is followed by Oncologists for Left breast cancer in 1998 and right breast cancer 2010.  Hypertension This is a chronic problem. The current episode started more than 1 year ago. The problem has been waxing and waning since onset. The problem is uncontrolled. Associated symptoms include anxiety and peripheral edema. Pertinent negatives include no malaise/fatigue or shortness of breath. Risk factors for coronary artery disease include obesity, dyslipidemia and sedentary lifestyle. The current treatment provides moderate improvement. There is no history of CVA or heart failure.  Shoulder Pain  Associated symptoms include stiffness.  Arthritis Presents for follow-up visit. She complains of pain and stiffness. The symptoms have been stable. Affected locations include the left shoulder, right shoulder, left knee and right knee. Her pain is at a severity of 8/10.  Hyperlipidemia This is a chronic problem. The current episode started more than 1 year ago. The problem is controlled. Recent lipid tests were reviewed and are normal. Pertinent negatives include no shortness of breath. Current antihyperlipidemic treatment includes statins. The current treatment provides moderate improvement of lipids. Risk factors for coronary artery disease include a sedentary lifestyle, post-menopausal and hypertension.  Anxiety Presents for follow-up visit. Symptoms include depressed mood, excessive worry, irritability, nervous/anxious behavior and restlessness. Patient reports no shortness of breath. Symptoms occur most days. The severity of symptoms is moderate. The quality of sleep is good.     Depression        This is a chronic problem.  The current episode started more than 1 year ago.   The problem occurs intermittently.  Associated symptoms include irritable and restlessness.  Associated symptoms include no helplessness and no hopelessness.  Past medical history includes anxiety.       Review of Systems  Constitutional: Positive for irritability. Negative for malaise/fatigue.  Respiratory: Negative for shortness of breath.   Musculoskeletal: Positive for arthritis and stiffness.  Psychiatric/Behavioral: Positive for depression. The patient is nervous/anxious.   All other systems reviewed and are negative.      Objective:   Physical Exam Vitals reviewed.  Constitutional:      General: She is irritable. She is not in acute distress.    Appearance: She is well-developed. She is obese.  HENT:     Head: Normocephalic and atraumatic.     Right Ear: Tympanic membrane normal.     Left Ear: Tympanic membrane normal.  Eyes:     Pupils: Pupils are equal, round, and reactive to light.  Neck:     Thyroid: No thyromegaly.  Cardiovascular:     Rate and Rhythm: Normal rate and regular rhythm.     Heart sounds: Normal heart sounds. No murmur heard.   Pulmonary:     Effort: Pulmonary effort is normal. No respiratory distress.     Breath sounds: Normal breath sounds. No wheezing.  Abdominal:     General: Bowel sounds are normal. There is no distension.     Palpations: Abdomen is soft.     Tenderness: There is no abdominal tenderness.  Musculoskeletal:        General: No tenderness. Normal range of motion.  Cervical back: Normal range of motion and neck supple.  Skin:    General: Skin is warm and dry.  Neurological:     Mental Status: She is alert and oriented to person, place, and time.     Cranial Nerves: No cranial nerve deficit.     Deep Tendon Reflexes: Reflexes are normal and symmetric.  Psychiatric:        Behavior: Behavior normal.        Thought  Content: Thought content normal.        Judgment: Judgment normal.       BP (!) 147/78   Pulse 80   Temp (!) 96 F (35.6 C) (Temporal)   Ht '5\' 4"'  (1.626 m)   Wt 198 lb (89.8 kg)   LMP 01/25/2004   SpO2 97%   BMI 33.99 kg/m      Assessment & Plan:  Alison [name] comes in today with chief complaint of Medical Management of Chronic Issues, Hypertension, and Shoulder Pain (BOTH SHOULDER PAIN, COULD NOT SLEEP LAST NIGHT )   Diagnosis and orders addressed:  1. Hypertension with goal blood pressure less than 130/80 - metoprolol succinate (TOPROL-XL) 25 MG 24 hr tablet; Take 0.5 tablets (12.5 mg total) by mouth daily. Blood pressure and heart rate  Dispense: 90 tablet; Refill: 1 - losartan-hydrochlorothiazide (HYZAAR) 100-25 MG tablet; Take 1 tablet by mouth daily.  Dispense: 90 tablet; Refill: 3 - CMP14+EGFR - CBC with Differential/Platelet  2. Primary osteoarthritis of both knees - meloxicam (MOBIC) 15 MG tablet; Take 1 tablet (15 mg total) by mouth daily.  Dispense: 90 tablet; Refill: 1 - CMP14+EGFR - CBC with Differential/Platelet  3. Anxiety - CMP14+EGFR - CBC with Differential/Platelet  4. Depression, major, single episode, mild (HCC) - CMP14+EGFR - CBC with Differential/Platelet - escitalopram (LEXAPRO) 10 MG tablet; Take 1 tablet (10 mg total) by mouth daily.  Dispense: 90 tablet; Refill: 3  5. Moderate mixed hyperlipidemia not requiring statin therapy - rosuvastatin (CRESTOR) 10 MG tablet; Take 1 tablet (10 mg total) by mouth daily.  Dispense: 90 tablet; Refill: 3 - CMP14+EGFR - CBC with Differential/Platelet  6. Need for hepatitis C screening test - CMP14+EGFR - CBC with Differential/Platelet - Hepatitis C antibody  7. History of right breast cancer   Labs pending Health Maintenance reviewed Diet and exercise encouraged  Follow up plan: 6 months    Evelina Dun, FNP

## 2019-11-22 LAB — CBC WITH DIFFERENTIAL/PLATELET
Basophils Absolute: 0 10*3/uL (ref 0.0–0.2)
Basos: 1 %
EOS (ABSOLUTE): 0 10*3/uL (ref 0.0–0.4)
Eos: 0 %
Hematocrit: 40 % (ref 34.0–46.6)
Hemoglobin: 13.2 g/dL (ref 11.1–15.9)
Immature Grans (Abs): 0 10*3/uL (ref 0.0–0.1)
Immature Granulocytes: 0 %
Lymphocytes Absolute: 1 10*3/uL (ref 0.7–3.1)
Lymphs: 21 %
MCH: 32.3 pg (ref 26.6–33.0)
MCHC: 33 g/dL (ref 31.5–35.7)
MCV: 98 fL — ABNORMAL HIGH (ref 79–97)
Monocytes Absolute: 0.4 10*3/uL (ref 0.1–0.9)
Monocytes: 8 %
Neutrophils Absolute: 3.4 10*3/uL (ref 1.4–7.0)
Neutrophils: 70 %
Platelets: 244 10*3/uL (ref 150–450)
RBC: 4.09 x10E6/uL (ref 3.77–5.28)
RDW: 12.3 % (ref 11.7–15.4)
WBC: 4.9 10*3/uL (ref 3.4–10.8)

## 2019-11-22 LAB — CMP14+EGFR
ALT: 20 IU/L (ref 0–32)
AST: 26 IU/L (ref 0–40)
Albumin/Globulin Ratio: 1.7 (ref 1.2–2.2)
Albumin: 4.5 g/dL (ref 3.7–4.7)
Alkaline Phosphatase: 76 IU/L (ref 44–121)
BUN/Creatinine Ratio: 17 (ref 12–28)
BUN: 15 mg/dL (ref 8–27)
Bilirubin Total: 1.3 mg/dL — ABNORMAL HIGH (ref 0.0–1.2)
CO2: 23 mmol/L (ref 20–29)
Calcium: 10.1 mg/dL (ref 8.7–10.3)
Chloride: 103 mmol/L (ref 96–106)
Creatinine, Ser: 0.87 mg/dL (ref 0.57–1.00)
GFR calc Af Amer: 74 mL/min/{1.73_m2} (ref >59)
GFR calc non Af Amer: 64 mL/min/{1.73_m2} (ref >59)
Globulin, Total: 2.6 g/dL (ref 1.5–4.5)
Glucose: 109 mg/dL — ABNORMAL HIGH (ref 65–99)
Potassium: 3.9 mmol/L (ref 3.5–5.2)
Sodium: 140 mmol/L (ref 134–144)
Total Protein: 7.1 g/dL (ref 6.0–8.5)

## 2019-11-22 LAB — HEPATITIS C ANTIBODY: Hep C Virus Ab: <0.1 s/co ratio (ref 0.0–0.9)

## 2019-12-16 ENCOUNTER — Telehealth: Payer: Self-pay

## 2019-12-16 DIAGNOSIS — I10 Essential (primary) hypertension: Secondary | ICD-10-CM

## 2019-12-17 ENCOUNTER — Other Ambulatory Visit: Payer: Self-pay | Admitting: Family

## 2019-12-17 DIAGNOSIS — I10 Essential (primary) hypertension: Secondary | ICD-10-CM

## 2019-12-17 MED ORDER — METOPROLOL SUCCINATE ER 25 MG PO TB24: 12.5000 mg | ORAL_TABLET | Freq: Every day | ORAL | 1 refills | Status: DC

## 2019-12-17 NOTE — Telephone Encounter (Signed)
Prescription sent to pharmacy.

## 2019-12-23 ENCOUNTER — Other Ambulatory Visit: Payer: Self-pay | Admitting: Family

## 2019-12-23 DIAGNOSIS — I10 Essential (primary) hypertension: Secondary | ICD-10-CM

## 2019-12-24 ENCOUNTER — Telehealth: Payer: Self-pay

## 2019-12-24 DIAGNOSIS — I10 Essential (primary) hypertension: Secondary | ICD-10-CM

## 2019-12-24 MED ORDER — METOPROLOL SUCCINATE ER 25 MG PO TB24: 12.5000 mg | ORAL_TABLET | Freq: Every day | ORAL | 1 refills | Status: DC

## 2019-12-24 NOTE — Telephone Encounter (Signed)
  Prescription Request  12/24/2019  What is the name of the medication or equipment? Metoprolol 25 mg was refused and was seen 10-28 with Alyse Low and has six month appt schedule for 05-21-2020. Patient is out and needs today  Have you contacted your pharmacy to request a refill? (if applicable) Yes  Which pharmacy would you like this sent to? [facility] [place]   Patient notified that their request is being sent to the clinical staff for review and that they should receive a response within 2 business days.

## 2019-12-24 NOTE — Telephone Encounter (Signed)
TC to [facility] [place], they never received refill from 12/17/19, resent LMOVM to patient that the reason I denied yesterdays request was that I had refilled it on 11/23, but resent now since [facility] did not receive [facility]ill

## 2019-12-24 NOTE — Telephone Encounter (Signed)
Pt called in stating that her Metoprolol 25 mg was called in wrong. It was called in for 1/2 QD and she said she takes 1/2 in the morning & 1/2 in the afternoon Please advise

## 2019-12-25 ENCOUNTER — Telehealth: Payer: Self-pay

## 2019-12-25 DIAGNOSIS — I10 Essential (primary) hypertension: Secondary | ICD-10-CM

## 2019-12-25 MED ORDER — METOPROLOL SUCCINATE ER 25 MG PO TB24: 12.5000 mg | ORAL_TABLET | Freq: Two times a day (BID) | ORAL | 1 refills | Status: DC

## 2019-12-25 NOTE — Addendum Note (Signed)
Addended by: Chevis Pretty on: 12/25/2019 04:15 PM   Modules accepted: Orders

## 2019-12-25 NOTE — Telephone Encounter (Signed)
Patient called upset since she called yesterday and her medication was not re sent in. Please review message from yesterday and advise

## 2019-12-25 NOTE — Telephone Encounter (Signed)
Patient aware.

## 2019-12-25 NOTE — Telephone Encounter (Signed)
Prescription was corrected but looks like she got enough to last her 3 months anyway

## 2019-12-26 MED ORDER — METOPROLOL SUCCINATE ER 25 MG PO TB24: 12.5000 mg | ORAL_TABLET | Freq: Two times a day (BID) | ORAL | 3 refills | Status: DC

## 2019-12-26 NOTE — Addendum Note (Signed)
Addended by: Evelina Dun A on: 12/26/2019 02:12 PM   Modules accepted: Orders

## 2019-12-26 NOTE — Telephone Encounter (Signed)
Prescription sent to pharmacy.

## 2019-12-27 NOTE — Progress Notes (Signed)
[facility]   Telephone:([phone] [phone]) [phone]   Clinic Follow up Note   Patient Care Team: Sharion Balloon, FNP as PCP - General (Family Medicine) Gordy Levan, MD (Hematology and Oncology)  Date of Service:  12/30/2019  CHIEF COMPLAINT: F/u of breast cancer   ONCOLOGY HISTORY  1. 1998 left T2 with micrometastatic involvement of sentinel node, treated with lumpectomy and axillary node evaluation, chemo/RT/tamoxifen (only for a few years).  2. Spring 2010 right T1bN2 (6 of 23 nodes involved) invasive ductal ER/PR + and HER 2 - treated with lumpectomy + axillary nodes, adjuvant adria/cytoxan/taxol, radiation and aromatase inhibitor, anastrozole.    CURRENT THERAPY:  Surveillance    INTERVAL HISTORY:  Alison [name] is here for a follow up of breast cancer. She was last seen by me 2 years ago and seen by NP Lacie 1 year ago in interim. She presents to the clinic alone. She notes she is doing well. She notes she has a skin rash on her left lateral neck for the past 2 days with itching, no pain. She notes this happened before on her right lateral neck. She notes she had COVID booster was 11/28/19 and her flu vaccine 1 week before her booster. She has been using cortisone cream. She note she had shingles in the past. She notes light spots of her skin on her face. She attributes to wearing a mask. She denies any concerns of her breast. She notes baseline arthritic pain and occasional back pain when laying down is stable. She denies new concerning pain. She notes her eating and appetite is adequate. She is interested in losing weight.     REVIEW OF SYSTEMS:   Constitutional: Denies fevers, chills or abnormal weight loss Eyes: Denies blurriness of vision Ears, nose, mouth, throat, and face: Denies mucositis or sore throat Respiratory: Denies cough, dyspnea or wheezes Cardiovascular: Denies palpitation, chest discomfort or lower extremity  swelling Gastrointestinal:  Denies nausea, heartburn or change in bowel habits Skin: (+) Skin rash of left lateral neck. (+) Light spots of skin on her face.  Lymphatics: Denies new lymphadenopathy or easy bruising Neurological:Denies numbness, tingling or new weaknesses Behavioral/Psych: Mood is stable, no new changes  All other systems were reviewed with the patient and are negative.  MEDICAL HISTORY:  Past Medical History:  Diagnosis Date  . Anxiety   . Arthritis   . Breast cancer (HCC)    X2  . Cervical dysplasia   . GERD (gastroesophageal reflux disease)    takes otc meds  . Glaucoma   . Hyperlipidemia   . Hypertension   . Lichen sclerosus   . Personal history of chemotherapy 2010  . Personal history of radiation therapy 1999  . Shingles 2012    SURGICAL HISTORY: Past Surgical History:  Procedure Laterality Date  . BREAST BIOPSY Right 2010  . BREAST LUMPECTOMY Right 2010  . BREAST LUMPECTOMY Left   . BREAST SURGERY     Lumpectomy right and left  . GYNECOLOGIC CRYOSURGERY    . HYSTEROSCOPY    . PORT-A-CATH REMOVAL Left 01/31/2014   Procedure: REMOVAL PORT-A-CATH;  Surgeon: Autumn Messing III, MD;  Location: [facility];  Service: General;  Laterality: Left;  . TUBAL LIGATION      I have reviewed the social history and family history with the patient and they are unchanged from previous note.  ALLERGIES:  has No Known Allergies.  MEDICATIONS:  Current Outpatient Medications  Medication Sig Dispense Refill  .  aspirin 81 MG tablet Take 81 mg by mouth daily.   (Patient not taking: Reported on 11/21/2019)    . Brinzolamide-Brimonidine (SIMBRINZA) 1-0.2 % SUSP Apply to eye.    . Calcium Carbonate-Vitamin D (CALCIUM + D PO) Take 2 capsules by mouth daily.     . cetirizine (ZYRTEC) 10 MG tablet Take 1 tablet (10 mg total) by mouth daily. 90 tablet 3  . clobetasol cream (TEMOVATE) 0.05 % Apply nightly as needed for itching 30 g 2  . docusate sodium (STOOL SOFTENER) 100 MG capsule  Take 100 mg by mouth 2 (two) times daily as needed for mild constipation.    Marland Kitchen escitalopram (LEXAPRO) 10 MG tablet Take 1 tablet (10 mg total) by mouth daily. 90 tablet 3  . fluticasone (FLONASE) 50 MCG/ACT nasal spray Place 2 sprays into both nostrils daily. 48 g 3  . KLOR-CON M20 20 MEQ tablet Take 1 tablet (20 mEq total) by mouth 2 (two) times daily. 180 tablet 2  . latanoprost (XALATAN) 0.005 % ophthalmic solution 1 drop at bedtime.    Marland Kitchen losartan-hydrochlorothiazide (HYZAAR) 100-25 MG tablet Take 1 tablet by mouth daily. 90 tablet 3  . meloxicam (MOBIC) 15 MG tablet Take 1 tablet (15 mg total) by mouth daily. 90 tablet 1  . metoprolol succinate (TOPROL-XL) 25 MG 24 hr tablet Take 0.5 tablets (12.5 mg total) by mouth in the morning and at bedtime. 90 tablet 3  . Omega-3 Fatty Acids (FISH OIL PO) Take 1 capsule by mouth daily.     . psyllium (METAMUCIL) 58.6 % powder Take 1 packet by mouth as needed.    . rosuvastatin (CRESTOR) 10 MG tablet Take 1 tablet (10 mg total) by mouth daily. 90 tablet 3   No current facility-administered medications for this visit.    PHYSICAL EXAMINATION: ECOG PERFORMANCE STATUS: 0 - Asymptomatic  Vitals:   12/30/19 1022  BP: (!) 164/71  Pulse: 74  Resp: 17  Temp: 97.8 F (36.6 C)  SpO2: 100%   Filed Weights   12/30/19 1022  Weight: 199 lb 9.6 oz (90.5 kg)    GENERAL:alert, no distress and comfortable SKIN: skin color, texture, turgor are normal, no rashes or significant lesions (+) skin rash of left lateral neck EYES: normal, Conjunctiva are pink and non-injected, sclera clear  NECK: supple, thyroid normal size, non-tender, without nodularity LYMPH:  no palpable lymphadenopathy in the cervical, axillary  LUNGS: clear to auscultation and percussion with normal breathing effort HEART: regular rate & rhythm and no murmurs and no lower extremity edema ABDOMEN:abdomen soft, non-tender and normal bowel sounds Musculoskeletal:no cyanosis of digits and no  clubbing  NEURO: alert & oriented x 3 with fluent speech, no focal motor/sensory deficits BREAST: S/p b/l lumpectomy: Surgical incision healed well with b/l scar tissue (L>R)(+) Skin hyperpigmentation of right breast from RT. No palpable mass, nodules or adenopathy bilaterally. Breast exam benign.   LABORATORY DATA:  I have reviewed the data as listed CBC Latest Ref Rng & Units 12/30/2019 11/21/2019 05/17/2019  WBC 4.0 - 10.5 K/uL 5.0 4.9 4.4  Hemoglobin 12.0 - 15.0 g/dL 12.5 13.2 13.1  Hematocrit 36 - 46 % 38.0 40.0 39.3  Platelets 150 - 400 K/uL 223 244 241     CMP Latest Ref Rng & Units 12/30/2019 11/21/2019 05/17/2019  Glucose 70 - 99 mg/dL 95 109(H) 90  BUN 8 - 23 mg/dL '14 15 13  ' Creatinine 0.44 - 1.00 mg/dL 0.94 0.87 0.92  Sodium 135 - 145 mmol/L 142  140 144  Potassium 3.5 - 5.1 mmol/L 3.8 3.9 4.0  Chloride 98 - 111 mmol/L 105 103 105  CO2 22 - 32 mmol/L '29 23 25  ' Calcium 8.9 - 10.3 mg/dL 9.9 10.1 9.7  Total Protein 6.5 - 8.1 g/dL 7.7 7.1 7.0  Total Bilirubin 0.3 - 1.2 mg/dL 1.8(H) 1.3(H) 1.3(H)  Alkaline Phos 38 - 126 U/L 66 76 71  AST 15 - 41 U/L '28 26 21  ' ALT 0 - 44 U/L '27 20 16      ' RADIOGRAPHIC STUDIES: I have personally reviewed the radiological images as listed and agreed with the findings in the report. No results found.   ASSESSMENT & PLAN:  Alison [name] is a 79 y.o. female with    1. Right upper outer quadrant breast invasive ductal carcinoma, stage IIIA, ER and PR positive, HER-2 negative -She was diagnosed in 2010 and treated with right breast lumpectomy, adjuvant AC-T, radiation and Anastrozole for 8 years.  -She is clinically doing well. Lab reviewed, her CBC and CMP are within normal limits except Tbili increased to 1.8. Her physical exam and her 06/2019 mammogram were unremarkable. There is no clinical concern for recurrence. -Continue surveillance. Next mammogram in 06/2020. She is over 10 years since her cancer diagnosis. She opted to continue yearly  follow up with me in our clinic.  -Lab and F/u in 01/2021   2. H/o Left breast cancer in 1998, T2 with micrometastatic involvement of sentinel node, treated with lumpectomy and axillary node evaluation, chemo/RT/tamoxifen (only for a few years).    3. Bone Health  -Her 03/2015 DEXA was normal (T-score -0.9 at right hip). 06/2018 DEXA normal as well (T-score -0.4 in left hip).  -Continue Calcium. I encouraged her to take Vit D. Continue DEXA scans every 2 years, next in 06/2020.    4. HTN, arthritis, Anxiety  -She'll continue follow-up with her primary care physician. -ON Hyzaar and takes lexapro and ativan PRN.  5. Mild Hyperbilirubinemia  -She has had mild elevation in tbili since 2013 in [facility] records, mostly around 1.3 -CMP today shows tbili 1.8 (12/30/19). She is asymptomatic, liver exam was unremarkable.  I recommend she repeat with her PCP at next visit in 02/2020. She is agreeable.  -If she has persistent hyperbilirubinemia, will get ultrasound of liver or CT AP   PLAN:  -f/u with PCP in 02/2020 and repeat her LFTs with her appointment, I will copy her PCP [facility] and DEXA in 06/2020 -Lab and f/u in 01/2021   No problem-specific Assessment & Plan notes found for this encounter.   Orders Placed This Encounter  Procedures  . MM Digital Screening    Standing Status:   Future    Standing Expiration Date:   12/29/2020    Order Specific Question:   Reason for Exam (SYMPTOM  OR DIAGNOSIS REQUIRED)    Answer:   screening    Order Specific Question:   Preferred imaging location?    Answer:   [facility]  . DG Bone Density    Standing Status:   Future    Standing Expiration Date:   12/29/2020    Order Specific Question:   Reason for Exam (SYMPTOM  OR DIAGNOSIS REQUIRED)    Answer:   screening    Order Specific Question:   Preferred imaging location?    Answer:   [facility]   All questions were answered. The patient knows to call the clinic with any  problems, questions  or concerns. No barriers to learning was detected. The total time spent in the appointment was 30 minutes.     Truitt Merle, MD 12/30/2019   I, Joslyn Devon, am acting as scribe for Truitt Merle, MD.   I have reviewed the above documentation for accuracy and completeness, and I agree with the above.

## 2019-12-30 ENCOUNTER — Inpatient Hospital Stay: Payer: Medicare HMO | Attending: Hematology

## 2019-12-30 ENCOUNTER — Other Ambulatory Visit: Payer: Self-pay

## 2019-12-30 ENCOUNTER — Inpatient Hospital Stay: Payer: Medicare HMO | Admitting: Hematology

## 2019-12-30 ENCOUNTER — Encounter: Payer: Self-pay | Admitting: Hematology

## 2019-12-30 VITALS — BP 164/71 | HR 74 | Temp 97.8°F | Resp 17 | Ht 64.0 in | Wt 199.6 lb

## 2019-12-30 DIAGNOSIS — Z1231 Encounter for screening mammogram for malignant neoplasm of breast: Secondary | ICD-10-CM | POA: Diagnosis not present

## 2019-12-30 DIAGNOSIS — Z853 Personal history of malignant neoplasm of breast: Secondary | ICD-10-CM | POA: Insufficient documentation

## 2019-12-30 DIAGNOSIS — Z923 Personal history of irradiation: Secondary | ICD-10-CM | POA: Insufficient documentation

## 2019-12-30 DIAGNOSIS — Z170 Estrogen receptor positive status [ER+]: Secondary | ICD-10-CM | POA: Insufficient documentation

## 2019-12-30 DIAGNOSIS — E785 Hyperlipidemia, unspecified: Secondary | ICD-10-CM | POA: Insufficient documentation

## 2019-12-30 DIAGNOSIS — K219 Gastro-esophageal reflux disease without esophagitis: Secondary | ICD-10-CM | POA: Insufficient documentation

## 2019-12-30 DIAGNOSIS — F419 Anxiety disorder, unspecified: Secondary | ICD-10-CM | POA: Diagnosis not present

## 2019-12-30 DIAGNOSIS — I10 Essential (primary) hypertension: Secondary | ICD-10-CM | POA: Diagnosis not present

## 2019-12-30 DIAGNOSIS — R69 Illness, unspecified: Secondary | ICD-10-CM | POA: Diagnosis not present

## 2019-12-30 DIAGNOSIS — E2839 Other primary ovarian failure: Secondary | ICD-10-CM | POA: Diagnosis not present

## 2019-12-30 DIAGNOSIS — Z9221 Personal history of antineoplastic chemotherapy: Secondary | ICD-10-CM | POA: Insufficient documentation

## 2019-12-30 DIAGNOSIS — Z79899 Other long term (current) drug therapy: Secondary | ICD-10-CM | POA: Diagnosis not present

## 2019-12-30 DIAGNOSIS — Z7982 Long term (current) use of aspirin: Secondary | ICD-10-CM | POA: Diagnosis not present

## 2019-12-30 DIAGNOSIS — C50411 Malignant neoplasm of upper-outer quadrant of right female breast: Secondary | ICD-10-CM

## 2019-12-30 DIAGNOSIS — C50919 Malignant neoplasm of unspecified site of unspecified female breast: Secondary | ICD-10-CM

## 2019-12-30 LAB — COMPREHENSIVE METABOLIC PANEL
ALT: 27 U/L (ref 0–44)
AST: 28 U/L (ref 15–41)
Albumin: 3.9 g/dL (ref 3.5–5.0)
Alkaline Phosphatase: 66 U/L (ref 38–126)
Anion gap: 8 (ref 5–15)
BUN: 14 mg/dL (ref 8–23)
CO2: 29 mmol/L (ref 22–32)
Calcium: 9.9 mg/dL (ref 8.9–10.3)
Chloride: 105 mmol/L (ref 98–111)
Creatinine, Ser: 0.94 mg/dL (ref 0.44–1.00)
GFR, Estimated: >60 mL/min (ref >60)
Glucose, Bld: 95 mg/dL (ref 70–99)
Potassium: 3.8 mmol/L (ref 3.5–5.1)
Sodium: 142 mmol/L (ref 135–145)
Total Bilirubin: 1.8 mg/dL — ABNORMAL HIGH (ref 0.3–1.2)
Total Protein: 7.7 g/dL (ref 6.5–8.1)

## 2019-12-30 LAB — CBC WITH DIFFERENTIAL/PLATELET
Abs Immature Granulocytes: 0.01 10*3/uL (ref 0.00–0.07)
Basophils Absolute: 0 10*3/uL (ref 0.0–0.1)
Basophils Relative: 1 %
Eosinophils Absolute: 0.2 10*3/uL (ref 0.0–0.5)
Eosinophils Relative: 3 %
HCT: 38 % (ref 36.0–46.0)
Hemoglobin: 12.5 g/dL (ref 12.0–15.0)
Immature Granulocytes: 0 %
Lymphocytes Relative: 18 %
Lymphs Abs: 0.9 10*3/uL (ref 0.7–4.0)
MCH: 32.4 pg (ref 26.0–34.0)
MCHC: 32.9 g/dL (ref 30.0–36.0)
MCV: 98.4 fL (ref 80.0–100.0)
Monocytes Absolute: 0.5 10*3/uL (ref 0.1–1.0)
Monocytes Relative: 11 %
Neutro Abs: 3.4 10*3/uL (ref 1.7–7.7)
Neutrophils Relative %: 67 %
Platelets: 223 10*3/uL (ref 150–400)
RBC: 3.86 MIL/uL — ABNORMAL LOW (ref 3.87–5.11)
RDW: 13.6 % (ref 11.5–15.5)
WBC: 5 10*3/uL (ref 4.0–10.5)
nRBC: 0 % (ref 0.0–0.2)

## 2020-01-30 ENCOUNTER — Ambulatory Visit: Payer: Medicare HMO | Admitting: Hematology

## 2020-01-30 ENCOUNTER — Other Ambulatory Visit: Payer: Medicare HMO

## 2020-03-16 ENCOUNTER — Encounter: Payer: Self-pay | Admitting: Family

## 2020-03-16 ENCOUNTER — Ambulatory Visit (INDEPENDENT_AMBULATORY_CARE_PROVIDER_SITE_OTHER): Payer: Medicare HMO | Admitting: Family

## 2020-03-16 ENCOUNTER — Other Ambulatory Visit: Payer: Self-pay

## 2020-03-16 VITALS — BP 152/69 | HR 74 | Temp 97.2°F | Ht 64.0 in | Wt 197.8 lb

## 2020-03-16 DIAGNOSIS — R69 Illness, unspecified: Secondary | ICD-10-CM | POA: Diagnosis not present

## 2020-03-16 DIAGNOSIS — I10 Essential (primary) hypertension: Secondary | ICD-10-CM | POA: Diagnosis not present

## 2020-03-16 DIAGNOSIS — M170 Bilateral primary osteoarthritis of knee: Secondary | ICD-10-CM | POA: Diagnosis not present

## 2020-03-16 DIAGNOSIS — Z170 Estrogen receptor positive status [ER+]: Secondary | ICD-10-CM

## 2020-03-16 DIAGNOSIS — C50411 Malignant neoplasm of upper-outer quadrant of right female breast: Secondary | ICD-10-CM

## 2020-03-16 DIAGNOSIS — E782 Mixed hyperlipidemia: Secondary | ICD-10-CM | POA: Diagnosis not present

## 2020-03-16 DIAGNOSIS — F419 Anxiety disorder, unspecified: Secondary | ICD-10-CM

## 2020-03-16 DIAGNOSIS — F320 Major depressive disorder, single episode, mild: Secondary | ICD-10-CM

## 2020-03-16 NOTE — Patient Instructions (Signed)

## 2020-03-16 NOTE — Progress Notes (Signed)
Subjective:    Patient ID: Alison [name], female    DOB: Mar 09, 1940, 80 y.o.   MRN: [id number]  Chief Complaint  Patient presents with  . Hypertension   PT presents to the office today for chronic follow up. She is followed by Oncologists for Left breast cancer in 1998 and right breast cancer 2010.  She reports her anxiety and depression are worse because she is worried about her brother is currently in the hospital with COVID.  Hypertension This is a chronic problem. The current episode started more than 1 year ago. The problem has been waxing and waning since onset. The problem is uncontrolled. Associated symptoms include anxiety and malaise/fatigue. Pertinent negatives include no peripheral edema or shortness of breath. Risk factors for coronary artery disease include dyslipidemia, obesity and sedentary lifestyle. The current treatment provides mild improvement.  Arthritis Presents for follow-up visit. She complains of pain and stiffness. The symptoms have been stable. Affected locations include the right knee, left knee, left hip and right hip. Her pain is at a severity of 4/10.  Hyperlipidemia This is a chronic problem. The current episode started more than 1 year ago. The problem is controlled. Recent lipid tests were reviewed and are normal. Exacerbating diseases include obesity. Pertinent negatives include no shortness of breath. Current antihyperlipidemic treatment includes statins. The current treatment provides moderate improvement of lipids. Risk factors for coronary artery disease include dyslipidemia, hypertension and a sedentary lifestyle.  Depression        This is a chronic problem.  The current episode started more than 1 year ago.   The onset quality is gradual.   The problem occurs intermittently.  Associated symptoms include irritable and restlessness.  Associated symptoms include no hopelessness.  Past medical history includes anxiety.   Anxiety Presents for follow-up  visit. Symptoms include excessive worry, irritability, nervous/anxious behavior and restlessness. Patient reports no shortness of breath. Symptoms occur most days. The severity of symptoms is moderate.        Review of Systems  Constitutional: Positive for irritability and malaise/fatigue.  Respiratory: Negative for shortness of breath.   Musculoskeletal: Positive for arthritis and stiffness.  Psychiatric/Behavioral: Positive for depression. The patient is nervous/anxious.   All other systems reviewed and are negative.      Objective:   Physical Exam Vitals reviewed.  Constitutional:      General: She is irritable. She is not in acute distress.    Appearance: She is well-developed and well-nourished.  HENT:     Head: Normocephalic and atraumatic.     Right Ear: Tympanic membrane normal.     Left Ear: Tympanic membrane normal.     Mouth/Throat:     Mouth: Oropharynx is clear and moist.  Eyes:     Pupils: Pupils are equal, round, and reactive to light.  Neck:     Thyroid: No thyromegaly.  Cardiovascular:     Rate and Rhythm: Normal rate and regular rhythm.     Pulses: Intact distal pulses.     Heart sounds: Normal heart sounds. No murmur heard.   Pulmonary:     Effort: Pulmonary effort is normal. No respiratory distress.     Breath sounds: Normal breath sounds. No wheezing.  Abdominal:     General: Bowel sounds are normal. There is no distension.     Palpations: Abdomen is soft.     Tenderness: There is no abdominal tenderness.  Musculoskeletal:        General: No tenderness or edema.  Normal range of motion.     Cervical back: Normal range of motion and neck supple.  Skin:    General: Skin is warm and dry.  Neurological:     Mental Status: She is alert and oriented to person, place, and time.     Cranial Nerves: No cranial nerve deficit.     Deep Tendon Reflexes: Reflexes are normal and symmetric.  Psychiatric:        Mood and Affect: Mood and affect normal.         Behavior: Behavior normal.        Thought Content: Thought content normal.        Judgment: Judgment normal.          BP (!) 152/69   Pulse 74   Temp (!) 97.2 F (36.2 C)   Ht '5\' 4"'  (1.626 m)   Wt 197 lb 12.8 oz (89.7 kg)   LMP 01/25/2004   SpO2 100%   BMI 33.95 kg/m   Assessment & Plan:  TONJI ELLIFF comes in today with chief complaint of Hypertension   Diagnosis and orders addressed:  1. Hypertension with goal blood pressure less than 130/80 - CBC with Differential/Platelet - CMP14+EGFR - Lipid panel - AMB Referral to [facility]  2. Primary osteoarthritis of both knees - AMB Referral to [facility]  3. Malignant neoplasm of upper-outer quadrant of right breast in female, estrogen receptor positive ([place]) - AMB Referral to [facility]  4. Moderate mixed hyperlipidemia not requiring statin therapy - AMB Referral to [facility]  5. Anxiety - AMB Referral to [facility]  6. Depression, major, single episode, mild ([place]) - AMB Referral to [facility] pending Health Maintenance reviewed Diet and exercise encouraged  Follow up plan:  6 months    Evelina Dun, FNP

## 2020-03-17 LAB — CMP14+EGFR
ALT: 21 IU/L (ref 0–32)
AST: 26 IU/L (ref 0–40)
Albumin/Globulin Ratio: 1.6 (ref 1.2–2.2)
Albumin: 4.5 g/dL (ref 3.7–4.7)
Alkaline Phosphatase: 74 IU/L (ref 44–121)
BUN/Creatinine Ratio: 12 (ref 12–28)
BUN: 12 mg/dL (ref 8–27)
Bilirubin Total: 1.2 mg/dL (ref 0.0–1.2)
CO2: 23 mmol/L (ref 20–29)
Calcium: 9.6 mg/dL (ref 8.7–10.3)
Chloride: 103 mmol/L (ref 96–106)
Creatinine, Ser: 0.99 mg/dL (ref 0.57–1.00)
GFR calc Af Amer: 63 mL/min/{1.73_m2} (ref >59)
GFR calc non Af Amer: 55 mL/min/{1.73_m2} — ABNORMAL LOW (ref >59)
Globulin, Total: 2.9 g/dL (ref 1.5–4.5)
Glucose: 98 mg/dL (ref 65–99)
Potassium: 4.1 mmol/L (ref 3.5–5.2)
Sodium: 141 mmol/L (ref 134–144)
Total Protein: 7.4 g/dL (ref 6.0–8.5)

## 2020-03-17 LAB — CBC WITH DIFFERENTIAL/PLATELET
Basophils Absolute: 0.1 10*3/uL (ref 0.0–0.2)
Basos: 1 %
EOS (ABSOLUTE): 0.1 10*3/uL (ref 0.0–0.4)
Eos: 3 %
Hematocrit: 39.2 % (ref 34.0–46.6)
Hemoglobin: 13 g/dL (ref 11.1–15.9)
Immature Grans (Abs): 0 10*3/uL (ref 0.0–0.1)
Immature Granulocytes: 0 %
Lymphocytes Absolute: 0.9 10*3/uL (ref 0.7–3.1)
Lymphs: 20 %
MCH: 32.6 pg (ref 26.6–33.0)
MCHC: 33.2 g/dL (ref 31.5–35.7)
MCV: 98 fL — ABNORMAL HIGH (ref 79–97)
Monocytes Absolute: 0.4 10*3/uL (ref 0.1–0.9)
Monocytes: 11 %
Neutrophils Absolute: 2.7 10*3/uL (ref 1.4–7.0)
Neutrophils: 65 %
Platelets: 260 10*3/uL (ref 150–450)
RBC: 3.99 x10E6/uL (ref 3.77–5.28)
RDW: 12.4 % (ref 11.7–15.4)
WBC: 4.2 10*3/uL (ref 3.4–10.8)

## 2020-03-17 LAB — LIPID PANEL
Chol/HDL Ratio: 2.8 ratio (ref 0.0–4.4)
Cholesterol, Total: 182 mg/dL (ref 100–199)
HDL: 64 mg/dL (ref >39)
LDL Chol Calc (NIH): 105 mg/dL — ABNORMAL HIGH (ref 0–99)
Triglycerides: 70 mg/dL (ref 0–149)
VLDL Cholesterol Cal: 13 mg/dL (ref 5–40)

## 2020-03-30 ENCOUNTER — Telehealth: Payer: Self-pay | Admitting: *Deleted

## 2020-03-30 NOTE — Chronic Care Management (AMB) (Signed)
  Chronic Care Management   Note  03/30/2020 Name: SABRYN PRESLAR MRN: [id number] DOB: 1940/03/22  Alison [name] is a 80 y.o. year old female who is a primary care patient of Sharion Balloon, FNP. I reached out to Alison [name] by phone today in response to a referral sent by Ms. Michell Heinrich Barot's PCP, Sharion Balloon, FNP     Ms. Kvamme was given information about Chronic Care Management services today including:  1. CCM service includes personalized support from designated clinical staff supervised by her physician, including individualized plan of care and coordination with other care providers 2. 24/7 contact phone numbers for assistance for urgent and routine care needs. 3. Service will only be billed when office clinical staff spend 20 minutes or more in a month to coordinate care. 4. Only one practitioner may furnish and bill the service in a calendar month. 5. The patient may stop CCM services at any time (effective at the end of the month) by phone call to the office staff. 6. The patient will be responsible for cost sharing (co-pay) of up to 20% of the service fee (after annual deductible is met).  Patient agreed to services and verbal consent obtained.   Follow up plan: Telephone appointment with care management team member scheduled for:04/13/2020  [facility]  Direct Dial: [phone]

## 2020-04-09 ENCOUNTER — Telehealth: Payer: Self-pay | Admitting: *Deleted

## 2020-04-09 NOTE — Chronic Care Management (AMB) (Signed)
  Care Management   Note  04/09/2020 Name: ROCKELLE HEUERMAN MRN: [id number] DOB: 13-Jul-1940  [name] is a 80 y.o. year old female who is a primary care patient of Sharion Balloon, FNP and is actively engaged with the care management team. I reached out to [name] by phone today to assist with re-scheduling an initial visit with the RN Case Manager  Follow up plan:   A telephone outreach attempt [facility] with patient unavailable to speak advised to call her another day .The care management team will reach out to the patient again over the next 7 days.  If patient returns call to provider office, please advise to call [place] Lysle Morales at [facility]

## 2020-04-13 ENCOUNTER — Telehealth: Payer: Medicare HMO

## 2020-04-15 NOTE — Chronic Care Management (AMB) (Signed)
  Care Management   Note  04/15/2020 Name: Alison [name] MRN: [id number] DOB: September 19, 1940  Alison [name] is a 80 y.o. year old female who is a primary care patient of Sharion Balloon, FNP and is actively engaged with the care management team. I reached out to Alison [name] by phone today to assist with re-scheduling an initial visit with the RN Case Manager  Follow up plan: Telephone appointment with care management team member scheduled for: 04/22/2020  [facility]

## 2020-04-22 ENCOUNTER — Ambulatory Visit: Payer: Medicare HMO | Admitting: *Deleted

## 2020-04-22 ENCOUNTER — Encounter: Payer: Self-pay | Admitting: *Deleted

## 2020-04-22 ENCOUNTER — Telehealth: Payer: Medicare HMO

## 2020-04-22 DIAGNOSIS — I10 Essential (primary) hypertension: Secondary | ICD-10-CM

## 2020-04-22 DIAGNOSIS — F320 Major depressive disorder, single episode, mild: Secondary | ICD-10-CM

## 2020-04-22 NOTE — Patient Instructions (Signed)
Plan:  . The patient has been provided with contact information for the care management team and has been advised to call if they would like to re-enroll in the CCM program to receive care management and care coordination services.  . CCM enrollment status changed to "previously enrolled" as per patient request on 04/22/2020  to discontinue enrollment. Case closed to case management services in primary care home.  . Patient was provided with general disease management education and encouraged to follow-up with their PCP and specialists as recommended.   Chong Sicilian, BSN, RN-BC Embedded Chronic Care Manager [facility] Direct Dial: [phone]

## 2020-04-22 NOTE — Chronic Care Management (AMB) (Signed)
  Chronic Care Management   Initial Visit Note  04/22/2020 Name: Alison [name] MRN: [id number] DOB: [birth year]  Referred by: Sharion Balloon, FNP Reason for referral : Chronic Care Management (Initial visit)   Alison [name] is a 80 y.o. year old female who is a primary care patient of Sharion Balloon, FNP. The CCM team was consulted for assistance with chronic disease management and care coordination needs related to HTN, HLD, Anxiety and Depression  Review of patient status, including review of consultants reports, relevant laboratory and other test results was performed prior to outreach.   I spoke with Alison [name] by telephone today regarding management of their chronic medical conditions. They do not have any CCM or resource needs and feel that their medical conditions are well managed. They appreciated the outreach, but do not feel that CCM services are needed at this time. They will reach out in the future if a need arises.   SDOH (Social Determinants of Health) assessments performed: Yes See Care Plan activities for detailed interventions related to SDOH     Plan:  . The patient has been provided with contact information for the care management team and has been advised to call if they would like to re-enroll in the CCM program to receive care management and care coordination services.  . CCM enrollment status changed to "previously enrolled" as per patient request on 04/22/2020  to discontinue enrollment. Case closed to case management services in primary care home.  . Patient was provided with general disease management education and encouraged to follow-up with their PCP and specialists as recommended.   Chong Sicilian, BSN, RN-BC Embedded Chronic Care Manager [facility] Direct Dial: [phone]

## 2020-05-01 DIAGNOSIS — E785 Hyperlipidemia, unspecified: Secondary | ICD-10-CM | POA: Diagnosis not present

## 2020-05-01 DIAGNOSIS — I10 Essential (primary) hypertension: Secondary | ICD-10-CM | POA: Diagnosis not present

## 2020-05-01 DIAGNOSIS — J309 Allergic rhinitis, unspecified: Secondary | ICD-10-CM | POA: Diagnosis not present

## 2020-05-01 DIAGNOSIS — R69 Illness, unspecified: Secondary | ICD-10-CM | POA: Diagnosis not present

## 2020-05-01 DIAGNOSIS — F419 Anxiety disorder, unspecified: Secondary | ICD-10-CM | POA: Diagnosis not present

## 2020-05-01 DIAGNOSIS — K219 Gastro-esophageal reflux disease without esophagitis: Secondary | ICD-10-CM | POA: Diagnosis not present

## 2020-05-01 DIAGNOSIS — G8929 Other chronic pain: Secondary | ICD-10-CM | POA: Diagnosis not present

## 2020-05-01 DIAGNOSIS — Z6841 Body Mass Index (BMI) 40.0 and over, adult: Secondary | ICD-10-CM | POA: Diagnosis not present

## 2020-05-01 DIAGNOSIS — H409 Unspecified glaucoma: Secondary | ICD-10-CM | POA: Diagnosis not present

## 2020-05-21 ENCOUNTER — Ambulatory Visit: Payer: Medicare HMO | Admitting: Family

## 2020-07-15 ENCOUNTER — Ambulatory Visit
Admission: RE | Admit: 2020-07-15 | Discharge: 2020-07-15 | Disposition: A | Discharge status: Home / Self Care | Payer: Medicare HMO | Source: Ambulatory Visit | Attending: Hematology | Admitting: Hematology

## 2020-07-15 ENCOUNTER — Other Ambulatory Visit: Payer: Self-pay

## 2020-07-15 ENCOUNTER — Other Ambulatory Visit: Payer: Self-pay | Admitting: Hematology

## 2020-07-15 DIAGNOSIS — Z780 Asymptomatic menopausal state: Secondary | ICD-10-CM | POA: Diagnosis not present

## 2020-07-15 DIAGNOSIS — Z1231 Encounter for screening mammogram for malignant neoplasm of breast: Secondary | ICD-10-CM

## 2020-07-15 DIAGNOSIS — E2839 Other primary ovarian failure: Secondary | ICD-10-CM

## 2020-07-17 ENCOUNTER — Telehealth: Payer: Self-pay | Admitting: *Deleted

## 2020-07-17 NOTE — Telephone Encounter (Signed)
Notified pt of good DEXA result per Dr Burr Medico.  Pt expressed appreciation for result.

## 2020-07-17 NOTE — Telephone Encounter (Signed)
-----   Message from Truitt Merle, MD sent at 07/17/2020  7:34 AM EDT ----- Please let pt know her recent DEXA was normal, thanks   Truitt Merle  07/17/2020

## 2020-07-24 ENCOUNTER — Other Ambulatory Visit: Payer: Self-pay | Admitting: Family

## 2020-07-24 DIAGNOSIS — M170 Bilateral primary osteoarthritis of knee: Secondary | ICD-10-CM

## 2020-09-15 ENCOUNTER — Ambulatory Visit (INDEPENDENT_AMBULATORY_CARE_PROVIDER_SITE_OTHER): Payer: Medicare HMO | Admitting: Family

## 2020-09-15 ENCOUNTER — Other Ambulatory Visit: Payer: Self-pay

## 2020-09-15 ENCOUNTER — Encounter: Payer: Self-pay | Admitting: Family

## 2020-09-15 VITALS — BP 136/80 | HR 66 | Temp 97.7°F | Ht 64.0 in | Wt 203.6 lb

## 2020-09-15 DIAGNOSIS — I10 Essential (primary) hypertension: Secondary | ICD-10-CM

## 2020-09-15 DIAGNOSIS — E782 Mixed hyperlipidemia: Secondary | ICD-10-CM | POA: Diagnosis not present

## 2020-09-15 DIAGNOSIS — Z853 Personal history of malignant neoplasm of breast: Secondary | ICD-10-CM | POA: Diagnosis not present

## 2020-09-15 DIAGNOSIS — R69 Illness, unspecified: Secondary | ICD-10-CM | POA: Diagnosis not present

## 2020-09-15 DIAGNOSIS — M5416 Radiculopathy, lumbar region: Secondary | ICD-10-CM

## 2020-09-15 DIAGNOSIS — F320 Major depressive disorder, single episode, mild: Secondary | ICD-10-CM | POA: Diagnosis not present

## 2020-09-15 DIAGNOSIS — M170 Bilateral primary osteoarthritis of knee: Secondary | ICD-10-CM

## 2020-09-15 DIAGNOSIS — F419 Anxiety disorder, unspecified: Secondary | ICD-10-CM

## 2020-09-15 LAB — CMP14+EGFR
ALT: 16 IU/L (ref 0–32)
AST: 23 IU/L (ref 0–40)
Albumin/Globulin Ratio: 1.8 (ref 1.2–2.2)
Albumin: 4.3 g/dL (ref 3.7–4.7)
Alkaline Phosphatase: 71 IU/L (ref 44–121)
BUN/Creatinine Ratio: 19 (ref 12–28)
BUN: 18 mg/dL (ref 8–27)
Bilirubin Total: 0.9 mg/dL (ref 0.0–1.2)
CO2: 21 mmol/L (ref 20–29)
Calcium: 9.5 mg/dL (ref 8.7–10.3)
Chloride: 104 mmol/L (ref 96–106)
Creatinine, Ser: 0.97 mg/dL (ref 0.57–1.00)
Globulin, Total: 2.4 g/dL (ref 1.5–4.5)
Glucose: 95 mg/dL (ref 65–99)
Potassium: 4 mmol/L (ref 3.5–5.2)
Sodium: 141 mmol/L (ref 134–144)
Total Protein: 6.7 g/dL (ref 6.0–8.5)
eGFR: 59 mL/min/{1.73_m2} — ABNORMAL LOW (ref >59)

## 2020-09-15 MED ORDER — METOPROLOL SUCCINATE ER 25 MG PO TB24: 12.5000 mg | ORAL_TABLET | Freq: Two times a day (BID) | ORAL | 3 refills | Status: DC

## 2020-09-15 NOTE — Progress Notes (Signed)
Subjective:    Patient ID: Alison [name], female    DOB: [birth year], 80 y.o.   MRN: [id number]  Chief Complaint  Patient presents with   Hypertension    6 month follow u   Knee Pain    Patient states she has been having ongoing bilateral knee pain   PT presents to the office today for chronic follow up. She is followed by Oncologists for Left breast cancer in 1998 and right breast cancer 2010.  Hypertension This is a chronic problem. The current episode started more than 1 year ago. The problem has been waxing and waning since onset. The problem is uncontrolled. Associated symptoms include anxiety and malaise/fatigue. Pertinent negatives include no peripheral edema or shortness of breath. Risk factors for coronary artery disease include dyslipidemia, obesity and sedentary lifestyle. The current treatment provides moderate improvement. There is no history of kidney disease or heart failure.  Knee Pain   Back Pain This is a chronic problem. The current episode started more than 1 year ago. The problem occurs intermittently. The problem has been waxing and waning since onset. The quality of the pain is described as aching. The pain is at a severity of 5/10. The pain is moderate. The treatment provided moderate relief.  Arthritis Presents for follow-up visit. She complains of pain and stiffness. Affected locations include the right knee, left knee, right MCP and left MCP. Her pain is at a severity of 7/10.  Hyperlipidemia This is a chronic problem. The current episode started more than 1 year ago. The problem is controlled. Recent lipid tests were reviewed and are normal. Exacerbating diseases include obesity. Pertinent negatives include no shortness of breath. Current antihyperlipidemic treatment includes statins. The current treatment provides moderate improvement of lipids. Risk factors for coronary artery disease include dyslipidemia, hypertension, a sedentary lifestyle and post-menopausal.   Depression        This is a chronic problem.  The current episode started more than 1 year ago.   The onset quality is gradual.   The problem occurs intermittently.  Associated symptoms include irritable, restlessness and sad.  Associated symptoms include no helplessness and no hopelessness.  Past treatments include nothing.  Compliance with treatment is good.  Past medical history includes anxiety.   Anxiety Presents for follow-up visit. Symptoms include depressed mood, excessive worry, nervous/anxious behavior and restlessness. Patient reports no shortness of breath. Symptoms occur occasionally. The severity of symptoms is moderate.       Review of Systems  Constitutional:  Positive for malaise/fatigue.  Respiratory:  Negative for shortness of breath.   Musculoskeletal:  Positive for arthritis, back pain and stiffness.  Psychiatric/Behavioral:  Positive for depression. The patient is nervous/anxious.   All other systems reviewed and are negative.     Objective:   Physical Exam Vitals reviewed.  Constitutional:      General: She is irritable. She is not in acute distress.    Appearance: She is well-developed. She is obese.  HENT:     Head: Normocephalic and atraumatic.     Right Ear: Tympanic membrane normal.     Left Ear: Tympanic membrane normal.  Eyes:     Pupils: Pupils are equal, round, and reactive to light.  Neck:     Thyroid: No thyromegaly.  Cardiovascular:     Rate and Rhythm: Normal rate and regular rhythm.     Heart sounds: Normal heart sounds. No murmur heard. Pulmonary:     Effort: Pulmonary effort is normal. No  respiratory distress.     Breath sounds: Normal breath sounds. No wheezing.  Abdominal:     General: Bowel sounds are normal. There is no distension.     Palpations: Abdomen is soft.     Tenderness: There is no abdominal tenderness.  Musculoskeletal:        General: No tenderness. Normal range of motion.     Cervical back: Normal range of motion and  neck supple.  Skin:    General: Skin is warm and dry.  Neurological:     Mental Status: She is alert and oriented to person, place, and time.     Cranial Nerves: No cranial nerve deficit.     Deep Tendon Reflexes: Reflexes are normal and symmetric.  Psychiatric:        Behavior: Behavior normal.        Thought Content: Thought content normal.        Judgment: Judgment normal.      BP 136/80   Pulse 66   Temp 97.7 F (36.5 C) (Temporal)   Ht '5\' 4"'  (1.626 m)   Wt 203 lb 9.6 oz (92.4 kg)   LMP 01/25/2004   SpO2 96%   BMI 34.95 kg/m      Assessment & Plan:  Alison [name] comes in today with chief complaint of Hypertension (6 month follow u) and Knee Pain (Patient states she has been having ongoing bilateral knee pain)   Diagnosis and orders addressed:  1. Hypertension with goal blood pressure less than 130/80 - metoprolol succinate (TOPROL-XL) 25 MG 24 hr tablet; Take 0.5 tablets (12.5 mg total) by mouth in the morning and at bedtime.  Dispense: 90 tablet; Refill: 3 - CMP14+EGFR  2. Left lumbar radiculopathy - CMP14+EGFR  3. Depression, major, single episode, mild ([facility]) - CMP14+EGFR  4. Moderate mixed hyperlipidemia not requiring statin therapy - CMP14+EGFR  5. Anxiety - CMP14+EGFR  6. Primary osteoarthritis of both knees - CMP14+EGFR  7. History of right breast cancer - CMP14+EGFR   Labs pending Health Maintenance reviewed Diet and exercise encouraged  Follow up plan: 6 months    Evelina Dun, FNP

## 2020-09-15 NOTE — Patient Instructions (Signed)

## 2020-09-18 ENCOUNTER — Ambulatory Visit (INDEPENDENT_AMBULATORY_CARE_PROVIDER_SITE_OTHER): Payer: Medicare HMO

## 2020-09-18 VITALS — Ht 64.0 in | Wt 204.0 lb

## 2020-09-18 DIAGNOSIS — Z0000 Encounter for general adult medical examination without abnormal findings: Secondary | ICD-10-CM | POA: Diagnosis not present

## 2020-09-18 NOTE — Progress Notes (Signed)
Subjective:   Alison [name] is a 80 y.o. female who presents for Medicare Annual (Subsequent) preventive examination.  Virtual Visit via Telephone Note  I connected with  Alison [name] on 09/18/20 at  1:15 PM EDT by telephone and verified that I am speaking with the correct person using two identifiers.  Location: Patient: Home Provider: [facility] Persons participating in the virtual visit: patient/Nurse Health Advisor   I discussed the limitations, risks, security and privacy concerns of performing an evaluation and management service by telephone and the availability of in person appointments. The patient expressed understanding and agreed to proceed.  Interactive audio and video telecommunications were attempted between this nurse and patient, however failed, due to patient having technical difficulties OR patient did not have access to video capability.  We continued and completed visit with audio only.  Some vital signs may be absent or patient reported.   Stayce Delancy E Daric Koren, LPN   Review of Systems     Cardiac Risk Factors include: advanced age (>56mn, >>48women);obesity (BMI >30kg/m2);sedentary lifestyle;dyslipidemia;hypertension     Objective:    Today's Vitals   09/18/20 1302  Weight: 204 lb (92.5 kg)  Height: '5\' 4"'$  (1.626 m)  PainSc: 5    Body mass index is 35.02 kg/m.  Advanced Directives 09/18/2020 10/03/2018 12/30/2016 01/01/2016 07/09/2015 01/01/2015 01/29/2014  Does Patient Have a Medical Advance Directive? No No No No No No No  Would patient like information on creating a medical advance directive? No - Patient declined No - Patient declined - - No - patient declined information - -    Current Medications (verified) Outpatient Encounter Medications as of 09/18/2020  Medication Sig   aspirin 81 MG tablet Take 81 mg by mouth daily.   Brinzolamide-Brimonidine 1-0.2 % SUSP Apply to eye.   Calcium Carbonate-Vitamin D (CALCIUM + D PO) Take 2 capsules by mouth daily.     cetirizine (ZYRTEC) 10 MG tablet Take 1 tablet (10 mg total) by mouth daily.   clobetasol cream (TEMOVATE) 0.05 % Apply nightly as needed for itching   docusate sodium (COLACE) 100 MG capsule Take 100 mg by mouth 2 (two) times daily as needed for mild constipation.   fluticasone (FLONASE) 50 MCG/ACT nasal spray Place 2 sprays into both nostrils daily.   KLOR-CON M20 20 MEQ tablet Take 1 tablet (20 mEq total) by mouth 2 (two) times daily.   latanoprost (XALATAN) 0.005 % ophthalmic solution 1 drop at bedtime.   losartan-hydrochlorothiazide (HYZAAR) 100-25 MG tablet Take 1 tablet by mouth daily.   meloxicam (MOBIC) 15 MG tablet TAKE 1 TABLET BY MOUTH EVERY DAY   metoprolol succinate (TOPROL-XL) 25 MG 24 hr tablet Take 0.5 tablets (12.5 mg total) by mouth in the morning and at bedtime.   Omega-3 Fatty Acids (FISH OIL PO) Take 1 capsule by mouth daily.    rosuvastatin (CRESTOR) 10 MG tablet Take 1 tablet (10 mg total) by mouth daily.   No facility-administered encounter medications on file as of 09/18/2020.    Allergies (verified) Patient has no known allergies.   History: Past Medical History:  Diagnosis Date   Anxiety    Arthritis    [place]    [place]   Cervical dysplasia    GERD (gastroesophageal reflux disease)    takes otc meds   Glaucoma    Hyperlipidemia    Hypertension    Lichen sclerosus    Personal history of chemotherapy 2010   Personal history of radiation therapy  1999   Shingles 2012   Past Surgical History:  Procedure Laterality Date   BREAST BIOPSY Right 2010   BREAST LUMPECTOMY Right 2010   BREAST LUMPECTOMY Left    BREAST SURGERY     Lumpectomy right and left   GYNECOLOGIC CRYOSURGERY     HYSTEROSCOPY     PORT-A-CATH REMOVAL Left 01/31/2014   Procedure: REMOVAL PORT-A-CATH;  Surgeon: Autumn Messing III, MD;  Location: [facility];  Service: General;  Laterality: Left;   TUBAL LIGATION     Family History  Problem Relation Age of Onset   Hypertension Mother     Cancer Mother        colon   Colon cancer Mother    Diabetes Sister    Heart disease Father    Breast cancer Maternal Aunt 80   Breast cancer Maternal Aunt    Social History   Socioeconomic History   Marital status: Divorced    Spouse name: Not on file   Number of children: 3   Years of education: 10   Highest education level: 10th grade  Occupational History   Occupation: Retired  Tobacco Use   Smoking status: Never   Smokeless tobacco: Never  Scientific laboratory technician Use: Never used  Substance and Sexual Activity   Alcohol use: Yes    Comment: Rare   Drug use: No   Sexual activity: Not Currently    Birth control/protection: Post-menopausal    Comment: 1st intercourse 80 yo-Fewer than 5 partners  Other Topics Concern   Not on file  Social History Narrative   Lives alone. Children live in [place], [place] and [place].   Social Determinants of Health   Financial Resource Strain: Low Risk    Difficulty of Paying Living Expenses: Not hard at all  Food Insecurity: No Food Insecurity   Worried About Charity fundraiser in the Last Year: Never true   [place] in the Last Year: Never true  Transportation Needs: No Transportation Needs   Lack of Transportation (Medical): No   Lack of Transportation (Non-Medical): No  Physical Activity: Inactive   Days of Exercise per Week: 0 days   Minutes of Exercise per Session: 0 min  Stress: No Stress Concern Present   Feeling of Stress : Only a little  Social Connections: Moderately Integrated   Frequency of Communication with Friends and Family: More than three times a week   Frequency of Social Gatherings with Friends and Family: More than three times a week   Attends Religious Services: More than 4 times per year   Active Member of [employer] or Organizations: Yes   Attends Music therapist: More than 4 times per year   Marital Status: Divorced    Tobacco Counseling Counseling given: Not  Answered   Clinical Intake:  Pre-visit preparation completed: Yes  Pain : 0-10 Pain Score: 5  Pain Type: Chronic pain Pain Location: Knee Pain Orientation: Right Pain Descriptors / Indicators: Aching, Sore Pain Onset: More than a month ago Pain Frequency: Intermittent     BMI - recorded: 35.02 Nutritional Status: BMI > 30  Obese Nutritional Risks: None Diabetes: No  How often do you need to have someone help you when you read instructions, pamphlets, or other written materials from your doctor or pharmacy?: 1 - Never  Diabetic? No  Interpreter Needed?: No  Information entered by :: Tyah Acord, LPN   Activities of Daily Living In your present state of health,  do you have any difficulty performing the following activities: 09/18/2020  Hearing? Y  Comment mild  Vision? N  Difficulty concentrating or making decisions? N  Walking or climbing stairs? Y  Comment tries to avoid - hurts knee  Dressing or bathing? N  Doing errands, shopping? Y  Comment she doesn't Physiological scientist and eating ? N  Using the Toilet? N  In the past six months, have you accidently leaked urine? N  Do you have problems with loss of bowel control? N  Managing your Medications? N  Managing your Finances? N  Housekeeping or managing your Housekeeping? N  Some recent data might be hidden    Patient Care Team: Sharion Balloon, FNP as PCP - General (Family Medicine) Gordy Levan, MD (Hematology and Oncology)  Indicate any recent Medical Services you may have received from other than [facility] providers in the past year (date may be approximate).     Assessment:   This is a routine wellness examination for Alison [name].  Hearing/Vision screen Hearing Screening - Comments:: C/o mild hearing difficulties - declines hearing aids Vision Screening - Comments:: Wears eyeglasses - up to date with annual eye exams with [facility] in [place]  Dietary issues and exercise activities discussed: Current  Exercise Habits: The patient does not participate in regular exercise at present, Exercise limited by: orthopedic condition(s)   Goals Addressed             This Visit's Progress    awv   Not on track    10/03/2018 AWV Goal: Exercise for General Health  Patient will verbalize understanding of the benefits of increased physical activity: Exercising regularly is important. It will improve your overall fitness, flexibility, and endurance. Regular exercise also will improve your overall health. It can help you control your weight, reduce stress, and improve your bone density. Over the next year, patient will increase physical activity as tolerated with a goal of at least 150 minutes of moderate physical activity per week.  You can tell that you are exercising at a moderate intensity if your heart starts beating faster and you start breathing faster but can still hold a conversation. Moderate-intensity exercise ideas include: Walking 1 mile (1.6 km) in about 15 minutes Biking Hiking Golfing Dancing Water aerobics Patient will verbalize understanding of everyday activities that increase physical activity by providing examples like the following: Yard work, such as: Sales promotion account executive Gardening Washing windows or floors Patient will be able to explain general safety guidelines for exercising:  Before you start a new exercise program, talk with your health care provider. Do not exercise so much that you hurt yourself, feel dizzy, or get very short of breath. Wear comfortable clothes and wear shoes with good support. Drink plenty of water while you exercise to prevent dehydration or heat stroke. Work out until your breathing and your heartbeat get faster.       Depression Screen PHQ 2/9 Scores 09/18/2020 09/15/2020 03/16/2020 11/21/2019 05/21/2019 05/21/2019 11/12/2018  PHQ - 2 Score 0 '1 2 1 '$ 0 0 0  PHQ- 9  Score '2 2 5 2 2 '$ - -    Fall Risk Fall Risk  09/18/2020 09/15/2020 03/16/2020 11/21/2019 05/21/2019  Falls in the past year? 0 0 0 0 0  Number falls in past yr: 0 - - - -  Injury with Fall? 0 - - - -  Risk for fall due to :  Orthopedic patient;Impaired vision - - - -  Follow up Falls prevention discussed - - - -    FALL RISK PREVENTION PERTAINING TO THE HOME:  Any stairs in or around the home? No  If so, are there any without handrails? No  Home free of loose throw rugs in walkways, pet beds, electrical cords, etc? Yes  Adequate lighting in your home to reduce risk of falls? Yes   ASSISTIVE DEVICES UTILIZED TO PREVENT FALLS:  Life alert? No  Use of a cane, walker or w/c? No  Grab bars in the bathroom? Yes  Shower chair or bench in shower? No  Elevated toilet seat or a handicapped toilet? No   TIMED UP AND GO:  Was the test performed? No . Telephonic visit  Cognitive Function: Normal cognitive status assessed by direct observation by this Nurse Health Advisor. No abnormalities found.       6CIT Screen 10/03/2018  What Year? 0 points  What month? 0 points  What time? 0 points  Count back from 20 0 points  Months in reverse 0 points  Repeat phrase 0 points  Total Score 0    Immunizations Immunization History  Administered Date(s) Administered   Fluad Quad(high Dose 65+) 11/12/2018, 11/21/2019   Influenza Split 01/20/2018   Influenza, High Dose Seasonal PF 01/07/2016   Influenza, Seasonal, Injecte, Preservative Fre 12/03/2012, 01/01/2015   Influenza,inj,Quad PF,6+ Mos 12/03/2012, 01/01/2015   Influenza,trivalent, recombinat, inj, PF 11/11/2013, 10/25/2016   Moderna Sars-Covid-2 Vaccination 02/04/2019, 03/07/2019, 11/28/2019   Pneumococcal Conjugate-13 02/17/2016   Pneumococcal Polysaccharide-23 05/31/2017   Tdap 05/21/2019    TDAP status: Up to date  Flu Vaccine status: Up to date  Pneumococcal vaccine status: Up to date  Covid-19 vaccine status: Completed  vaccines  Qualifies for Shingles Vaccine? Yes   Zostavax completed No   Shingrix Completed?: No.    Education has been provided regarding the importance of this vaccine. Patient has been advised to call insurance company to determine out of pocket expense if they have not yet received this vaccine. Advised may also receive vaccine at local pharmacy or Health Dept. Verbalized acceptance and understanding.  Screening Tests Health Maintenance  Topic Date Due   COVID-19 Vaccine (4 - Booster for Moderna series) 10/01/2020 (Originally 02/28/2020)   Zoster Vaccines- Shingrix (1 of 2) 12/16/2020 (Originally 03/20/1959)   INFLUENZA VACCINE  04/23/2021 (Originally 08/24/2020)   TETANUS/TDAP  05/20/2029   DEXA SCAN  Completed   Hepatitis C Screening  Completed   PNA vac Low Risk Adult  Completed   HPV VACCINES  Aged Out    Health Maintenance  There are no preventive care reminders to display for this patient.  Colorectal cancer screening: No longer required.   Mammogram status: Completed 07/15/2020. Repeat every year  Bone Density status: Completed 07/15/2020. Results reflect: Bone density results: NORMAL. Repeat every 5 years.  Lung Cancer Screening: (Low Dose CT Chest recommended if Age 33-80 years, 30 pack-year currently smoking OR have quit w/in 15years.) does not qualify.   Additional Screening:  Hepatitis C Screening: does qualify; Completed 11/21/2019  Vision Screening: Recommended annual ophthalmology exams for early detection of glaucoma and other disorders of the eye. Is the patient up to date with their annual eye exam?  Yes  Who is the provider or what is the name of the office in which the patient attends annual eye exams? [facility] If pt is not established with a provider, would they like to be referred to a provider  to establish care? No .   Dental Screening: Recommended annual dental exams for proper oral hygiene  Community Resource Referral / Chronic Care  Management: CRR required this visit?  No   CCM required this visit?  No      Plan:     I have personally reviewed and noted the following in the patient's chart:   Medical and social history Use of alcohol, tobacco or illicit drugs  Current medications and supplements including opioid prescriptions.  Functional ability and status Nutritional status Physical activity Advanced directives List of other physicians Hospitalizations, surgeries, and ER visits in previous 12 months Vitals Screenings to include cognitive, depression, and falls Referrals and appointments  In addition, I have reviewed and discussed with patient certain preventive protocols, quality metrics, and best practice recommendations. A written personalized care plan for preventive services as well as general preventive health recommendations were provided to patient.     Sandrea Hammond, LPN   [id number]   Nurse Notes: None

## 2020-09-18 NOTE — Patient Instructions (Signed)
Alison [name] , Thank you for taking time to come for your Medicare Wellness Visit. I appreciate your ongoing commitment to your health goals. Please review the following plan we discussed and let me know if I can assist you in the future.   Screening recommendations/referrals: Colonoscopy: Done 04/17/2012 - no repeat required Mammogram: Done 07/15/2020 - Repeat annually Bone Density: Done 07/15/2020 - Repeat in 5 years Recommended yearly ophthalmology/optometry visit for glaucoma screening and checkup Recommended yearly dental visit for hygiene and checkup  Vaccinations: Influenza vaccine: Done 11/21/2019 - Repeat annually Pneumococcal vaccine: Done 02/17/2016 & 05/31/2017 Tdap vaccine: Done 05/15/2019 - Repeat in 10 years  Shingles vaccine: Due. Shingrix discussed. Please contact your pharmacy for coverage information.     Covid-19: Done 02/04/19, 03/07/19, & 11/28/19  Advanced directives: Please bring a copy of your health care power of attorney and living will to the office to be added to your chart at your convenience.   Conditions/risks identified: Aim for 30 minutes of exercise or brisk walking each day, drink 6-8 glasses of water and eat lots of fruits and vegetables.   Next appointment: Follow up in one year for your annual wellness visit    Preventive Care 65 Years and Older, Female Preventive care refers to lifestyle choices and visits with your health care provider that can promote health and wellness. What does preventive care include? A yearly physical exam. This is also called an annual well check. Dental exams once or twice a year. Routine eye exams. Ask your health care provider how often you should have your eyes checked. Personal lifestyle choices, including: Daily care of your teeth and gums. Regular physical activity. Eating a healthy diet. Avoiding tobacco and drug use. Limiting alcohol use. Practicing safe sex. Taking low-dose aspirin every day. Taking vitamin and  mineral supplements as recommended by your health care provider. What happens during an annual well check? The services and screenings done by your health care provider during your annual well check will depend on your age, overall health, lifestyle risk factors, and family history of disease. Counseling  Your health care provider may ask you questions about your: Alcohol use. Tobacco use. Drug use. Emotional well-being. Home and relationship well-being. Sexual activity. Eating habits. History of falls. Memory and ability to understand (cognition). Work and work Statistician. Reproductive health. Screening  You may have the following tests or measurements: Height, weight, and BMI. Blood pressure. Lipid and cholesterol levels. These may be checked every 5 years, or more frequently if you are over 34 years old. Skin check. Lung cancer screening. You may have this screening every year starting at age 54 if you have a 30-pack-year history of smoking and currently smoke or have quit within the past 15 years. Fecal occult blood test (FOBT) of the stool. You may have this test every year starting at age 25. Flexible sigmoidoscopy or colonoscopy. You may have a sigmoidoscopy every 5 years or a colonoscopy every 10 years starting at age 14. Hepatitis C blood test. Hepatitis B blood test. Sexually transmitted disease (STD) testing. Diabetes screening. This is done by checking your blood sugar (glucose) after you have not eaten for a while (fasting). You may have this done every 1-3 years. Bone density scan. This is done to screen for osteoporosis. You may have this done starting at age 1. Mammogram. This may be done every 1-2 years. Talk to your health care provider about how often you should have regular mammograms. Talk with your health care provider  about your test results, treatment options, and if necessary, the need for more tests. Vaccines  Your health care provider may recommend  certain vaccines, such as: Influenza vaccine. This is recommended every year. Tetanus, diphtheria, and acellular pertussis (Tdap, Td) vaccine. You may need a Td booster every 10 years. Zoster vaccine. You may need this after age 72. Pneumococcal 13-valent conjugate (PCV13) vaccine. One dose is recommended after age 61. Pneumococcal polysaccharide (PPSV23) vaccine. One dose is recommended after age 76. Talk to your health care provider about which screenings and vaccines you need and how often you need them. This information is not intended to replace advice given to you by your health care provider. Make sure you discuss any questions you have with your health care provider. Document Released: 02/06/2015 Document Revised: 09/30/2015 Document Reviewed: 11/11/2014 Elsevier Interactive Patient Education  2017 [place] Prevention in the Home Falls can cause injuries. They can happen to people of all ages. There are many things you can do to make your home safe and to help prevent falls. What can I do on the outside of my home? Regularly fix the edges of walkways and driveways and fix any cracks. Remove anything that might make you trip as you walk through a door, such as a raised step or threshold. Trim any bushes or trees on the path to your home. Use bright outdoor lighting. Clear any walking paths of anything that might make someone trip, such as rocks or tools. Regularly check to see if handrails are loose or broken. Make sure that both sides of any steps have handrails. Any raised decks and porches should have guardrails on the edges. Have any leaves, snow, or ice cleared regularly. Use sand or salt on walking paths during winter. Clean up any spills in your garage right away. This includes oil or grease spills. What can I do in the bathroom? Use night lights. Install grab bars by the toilet and in the tub and shower. Do not use towel bars as grab bars. Use non-skid mats or  decals in the tub or shower. If you need to sit down in the shower, use a plastic, non-slip stool. Keep the floor dry. Clean up any water that spills on the floor as soon as it happens. Remove soap buildup in the tub or shower regularly. Attach bath mats securely with double-sided non-slip rug tape. Do not have throw rugs and other things on the floor that can make you trip. What can I do in the bedroom? Use night lights. Make sure that you have a light by your bed that is easy to reach. Do not use any sheets or blankets that are too big for your bed. They should not hang down onto the floor. Have a firm chair that has side arms. You can use this for support while you get dressed. Do not have throw rugs and other things on the floor that can make you trip. What can I do in the kitchen? Clean up any spills right away. Avoid walking on wet floors. Keep items that you use a lot in easy-to-reach places. If you need to reach something above you, use a strong step stool that has a grab bar. Keep electrical cords out of the way. Do not use floor polish or wax that makes floors slippery. If you must use wax, use non-skid floor wax. Do not have throw rugs and other things on the floor that can make you trip. What can I do  with my stairs? Do not leave any items on the stairs. Make sure that there are handrails on both sides of the stairs and use them. Fix handrails that are broken or loose. Make sure that handrails are as long as the stairways. Check any carpeting to make sure that it is firmly attached to the stairs. Fix any carpet that is loose or worn. Avoid having throw rugs at the top or bottom of the stairs. If you do have throw rugs, attach them to the floor with carpet tape. Make sure that you have a light switch at the top of the stairs and the bottom of the stairs. If you do not have them, ask someone to add them for you. What else can I do to help prevent falls? Wear shoes that: Do not  have high heels. Have rubber bottoms. Are comfortable and fit you well. Are closed at the toe. Do not wear sandals. If you use a stepladder: Make sure that it is fully opened. Do not climb a closed stepladder. Make sure that both sides of the stepladder are locked into place. Ask someone to hold it for you, if possible. Clearly mark and make sure that you can see: Any grab bars or handrails. First and last steps. Where the edge of each step is. Use tools that help you move around (mobility aids) if they are needed. These include: Canes. Walkers. Scooters. Crutches. Turn on the lights when you go into a dark area. Replace any light bulbs as soon as they burn out. Set up your furniture so you have a clear path. Avoid moving your furniture around. If any of your floors are uneven, fix them. If there are any pets around you, be aware of where they are. Review your medicines with your doctor. Some medicines can make you feel dizzy. This can increase your chance of falling. Ask your doctor what other things that you can do to help prevent falls. This information is not intended to replace advice given to you by your health care provider. Make sure you discuss any questions you have with your health care provider. Document Released: 11/06/2008 Document Revised: 06/18/2015 Document Reviewed: 02/14/2014 Elsevier Interactive Patient Education  2017 [employer].

## 2020-10-19 DIAGNOSIS — Z0100 Encounter for examination of eyes and vision without abnormal findings: Secondary | ICD-10-CM | POA: Diagnosis not present

## 2020-10-19 DIAGNOSIS — E7800 Pure hypercholesterolemia, unspecified: Secondary | ICD-10-CM | POA: Diagnosis not present

## 2020-10-19 DIAGNOSIS — H52229 Regular astigmatism, unspecified eye: Secondary | ICD-10-CM | POA: Diagnosis not present

## 2020-10-22 ENCOUNTER — Other Ambulatory Visit: Payer: Self-pay | Admitting: Family

## 2020-10-23 ENCOUNTER — Other Ambulatory Visit: Payer: Self-pay | Admitting: Family

## 2020-10-23 DIAGNOSIS — M170 Bilateral primary osteoarthritis of knee: Secondary | ICD-10-CM

## 2020-11-24 ENCOUNTER — Ambulatory Visit: Payer: Medicare HMO

## 2020-12-02 DIAGNOSIS — Z961 Presence of intraocular lens: Secondary | ICD-10-CM | POA: Diagnosis not present

## 2020-12-02 DIAGNOSIS — H401131 Primary open-angle glaucoma, bilateral, mild stage: Secondary | ICD-10-CM | POA: Diagnosis not present

## 2020-12-17 ENCOUNTER — Other Ambulatory Visit: Payer: Self-pay | Admitting: Family

## 2020-12-17 DIAGNOSIS — I10 Essential (primary) hypertension: Secondary | ICD-10-CM

## 2021-01-24 ENCOUNTER — Other Ambulatory Visit: Payer: Self-pay | Admitting: Family

## 2021-01-24 DIAGNOSIS — M170 Bilateral primary osteoarthritis of knee: Secondary | ICD-10-CM

## 2021-01-26 ENCOUNTER — Other Ambulatory Visit: Payer: Medicare HMO

## 2021-01-26 ENCOUNTER — Telehealth: Payer: Self-pay | Admitting: Hematology

## 2021-01-26 ENCOUNTER — Ambulatory Visit: Payer: Medicare HMO | Admitting: Family Medicine

## 2021-01-26 DIAGNOSIS — R300 Dysuria: Secondary | ICD-10-CM

## 2021-01-26 NOTE — Telephone Encounter (Signed)
Rescheduled 01/04 per patient's request to 01/13, patient has been called and notified.

## 2021-01-27 ENCOUNTER — Encounter: Payer: Self-pay | Admitting: Family Medicine

## 2021-01-27 ENCOUNTER — Encounter: Payer: Self-pay | Admitting: Family

## 2021-01-27 ENCOUNTER — Ambulatory Visit (INDEPENDENT_AMBULATORY_CARE_PROVIDER_SITE_OTHER): Payer: Medicare HMO | Admitting: Family Medicine

## 2021-01-27 ENCOUNTER — Inpatient Hospital Stay: Payer: Medicare HMO | Admitting: Hematology

## 2021-01-27 ENCOUNTER — Inpatient Hospital Stay: Payer: Medicare HMO

## 2021-01-27 DIAGNOSIS — J310 Chronic rhinitis: Secondary | ICD-10-CM | POA: Diagnosis not present

## 2021-01-27 DIAGNOSIS — J329 Chronic sinusitis, unspecified: Secondary | ICD-10-CM | POA: Diagnosis not present

## 2021-01-27 DIAGNOSIS — N3000 Acute cystitis without hematuria: Secondary | ICD-10-CM | POA: Diagnosis not present

## 2021-01-27 DIAGNOSIS — C50411 Malignant neoplasm of upper-outer quadrant of right female breast: Secondary | ICD-10-CM

## 2021-01-27 LAB — URINALYSIS, ROUTINE W REFLEX MICROSCOPIC
Bilirubin, UA: NEGATIVE
Glucose, UA: NEGATIVE
Ketones, UA: NEGATIVE
Nitrite, UA: NEGATIVE
Protein,UA: NEGATIVE
Specific Gravity, UA: 1.02 (ref 1.005–1.030)
Urobilinogen, Ur: 0.2 mg/dL (ref 0.2–1.0)
pH, UA: 6 (ref 5.0–7.5)

## 2021-01-27 MED ORDER — CEFDINIR 300 MG PO CAPS: 300.0000 mg | ORAL_CAPSULE | Freq: Two times a day (BID) | ORAL | 0 refills | Status: DC

## 2021-01-27 MED ORDER — PHENAZOPYRIDINE HCL 100 MG PO TABS: 100.0000 mg | ORAL_TABLET | Freq: Three times a day (TID) | ORAL | 0 refills | Status: AC | PRN

## 2021-01-27 NOTE — Progress Notes (Signed)
Telephone visit  Subjective: CC:UTI PCP: Alison Balloon, FNP Alison [name] is a 81 y.o. female calls for telephone consult today. Patient provides verbal consent for consult held via phone.  Due to COVID-19 pandemic this visit was conducted virtually. This visit type was conducted due to national recommendations for restrictions regarding the COVID-19 Pandemic (e.g. social distancing, sheltering in place) in an effort to limit this patient's exposure and mitigate transmission in our community. All issues noted in this document were discussed and addressed.  A physical exam was not performed with this format.   Location of patient: home Location of provider: [facility] Others present for call: none  1. Urinary symptoms Patient reports a 1 week h/o mild dysuria and urinary frequency.  No urgency, hematuria, fevers, chills, abdominal pain, nausea, vomiting, back pain, vaginal discharge.  Patient has used nothing for symptoms. She reports some low back pain but she suffers from orthopedic issues.  2. Sinusitis She reports onset of nasal congestion and sinus drainage for the last month.  Using nasal spray    ROS: Per HPI  No Known Allergies Past Medical History:  Diagnosis Date   Anxiety    Arthritis    Breast cancer ([place])    X2   Cervical dysplasia    GERD (gastroesophageal reflux disease)    takes otc meds   Glaucoma    Hyperlipidemia    Hypertension    Lichen sclerosus    Personal history of chemotherapy 2010   Personal history of radiation therapy 1999   Shingles 2012    Current Outpatient Medications:    aspirin 81 MG tablet, Take 81 mg by mouth daily., Disp: , Rfl:    Brinzolamide-Brimonidine 1-0.2 % SUSP, Apply to eye., Disp: , Rfl:    Calcium Carbonate-Vitamin D (CALCIUM + D PO), Take 2 capsules by mouth daily. , Disp: , Rfl:    cetirizine (ZYRTEC) 10 MG tablet, Take 1 tablet (10 mg total) by mouth daily., Disp: 90 tablet, Rfl: 3   clobetasol cream (TEMOVATE) 0.05  %, Apply nightly as needed for itching, Disp: 30 g, Rfl: 2   docusate sodium (COLACE) 100 MG capsule, Take 100 mg by mouth 2 (two) times daily as needed for mild constipation., Disp: , Rfl:    fluticasone (FLONASE) 50 MCG/ACT nasal spray, SPRAY 2 SPRAYS INTO EACH NOSTRIL EVERY DAY, Disp: 48 mL, Rfl: 3   KLOR-CON M20 20 MEQ tablet, TAKE 1 TABLET BY MOUTH TWICE A DAY, Disp: 180 tablet, Rfl: 1   latanoprost (XALATAN) 0.005 % ophthalmic solution, 1 drop at bedtime., Disp: , Rfl:    losartan-hydrochlorothiazide (HYZAAR) 100-25 MG tablet, TAKE 1 TABLET BY MOUTH EVERY DAY, Disp: 90 tablet, Rfl: 0   meloxicam (MOBIC) 15 MG tablet, TAKE 1 TABLET BY MOUTH EVERY DAY, Disp: 90 tablet, Rfl: 0   metoprolol succinate (TOPROL-XL) 25 MG 24 hr tablet, Take 0.5 tablets (12.5 mg total) by mouth in the morning and at bedtime., Disp: 90 tablet, Rfl: 3   Omega-3 Fatty Acids (FISH OIL PO), Take 1 capsule by mouth daily. , Disp: , Rfl:    rosuvastatin (CRESTOR) 10 MG tablet, Take 1 tablet (10 mg total) by mouth daily., Disp: 90 tablet, Rfl: 3  Results for orders placed or performed in visit on 01/26/21 (from the past 24 hour(s))  Urinalysis, Routine w reflex microscopic     Status: Abnormal   Collection Time: 01/26/21  4:31 PM  Result Value Ref Range   Specific Gravity, UA 1.020 1.005 -  1.030   pH, UA 6.0 5.0 - 7.5   Color, UA Yellow Yellow   Appearance Ur Clear Clear   Leukocytes,UA Trace (A) Negative   Protein,UA Negative Negative/Trace   Glucose, UA Negative Negative   Ketones, UA Negative Negative   RBC, UA Trace (A) Negative   Bilirubin, UA Negative Negative   Urobilinogen, Ur 0.2 0.2 - 1.0 mg/dL   Nitrite, UA Negative Negative   Narrative   Performed at:  [facility] [address] [place], [place]  [id number] Lab Director: [facility], Phone:  [phone]    Assessment/ Plan: 81 y.o. female   1. Acute cystitis without hematuria Urinary tract infection appreciated on  urinalysis.  Urine culture has already been sent.  Awaiting results.  Start Omnicef twice daily.  Hopefully this will give [place] some dual therapy with her sinus symptoms below.  Home care instructions reviewed and reasons for her to be reevaluated discussed.  Follow-up as needed - cefdinir (OMNICEF) 300 MG capsule; Take 1 capsule (300 mg total) by mouth 2 (two) times daily. 1 po BID  Dispense: 20 capsule; Refill: 0 - phenazopyridine (PYRIDIUM) 100 MG tablet; Take 1 tablet (100 mg total) by mouth 3 (three) times daily as needed for up to 2 days for pain.  Dispense: 6 tablet; Refill: 0  2. Rhinosinusitis Most likely allergic in nature.  Continue current allergy medicine.  Omnicef prescribed for UTI as above should cover any bacterial sinusitis - cefdinir (OMNICEF) 300 MG capsule; Take 1 capsule (300 mg total) by mouth 2 (two) times daily. 1 po BID  Dispense: 20 capsule; Refill: 0   Start time: 11:38am End time: 11:43a  Total time spent on patient care (including telephone call/ virtual visit): 5 minutes  [name], [place] [phone]

## 2021-01-28 LAB — URINE CULTURE

## 2021-02-03 ENCOUNTER — Other Ambulatory Visit: Payer: Self-pay | Admitting: Family

## 2021-02-03 ENCOUNTER — Telehealth: Payer: Self-pay | Admitting: Hematology

## 2021-02-03 DIAGNOSIS — E782 Mixed hyperlipidemia: Secondary | ICD-10-CM

## 2021-02-03 NOTE — Telephone Encounter (Signed)
Sch per 1/11 inbasket, pt aware °

## 2021-02-05 ENCOUNTER — Ambulatory Visit: Payer: Medicare HMO | Admitting: Hematology

## 2021-02-05 ENCOUNTER — Other Ambulatory Visit: Payer: Medicare HMO

## 2021-02-25 ENCOUNTER — Other Ambulatory Visit: Payer: Self-pay

## 2021-02-25 DIAGNOSIS — E2839 Other primary ovarian failure: Secondary | ICD-10-CM

## 2021-02-25 DIAGNOSIS — C50411 Malignant neoplasm of upper-outer quadrant of right female breast: Secondary | ICD-10-CM

## 2021-02-26 ENCOUNTER — Other Ambulatory Visit: Payer: Self-pay

## 2021-02-26 ENCOUNTER — Inpatient Hospital Stay: Payer: Medicare HMO | Attending: Hematology

## 2021-02-26 ENCOUNTER — Inpatient Hospital Stay: Payer: Medicare HMO | Admitting: Hematology

## 2021-02-26 ENCOUNTER — Encounter: Payer: Self-pay | Admitting: Hematology

## 2021-02-26 VITALS — BP 159/78 | HR 76 | Temp 98.9°F | Resp 18 | Ht 64.0 in | Wt 198.2 lb

## 2021-02-26 DIAGNOSIS — Z9221 Personal history of antineoplastic chemotherapy: Secondary | ICD-10-CM | POA: Insufficient documentation

## 2021-02-26 DIAGNOSIS — Z170 Estrogen receptor positive status [ER+]: Secondary | ICD-10-CM

## 2021-02-26 DIAGNOSIS — Z853 Personal history of malignant neoplasm of breast: Secondary | ICD-10-CM | POA: Diagnosis not present

## 2021-02-26 DIAGNOSIS — C50411 Malignant neoplasm of upper-outer quadrant of right female breast: Secondary | ICD-10-CM

## 2021-02-26 DIAGNOSIS — Z7982 Long term (current) use of aspirin: Secondary | ICD-10-CM | POA: Insufficient documentation

## 2021-02-26 DIAGNOSIS — I10 Essential (primary) hypertension: Secondary | ICD-10-CM | POA: Diagnosis not present

## 2021-02-26 DIAGNOSIS — Z79899 Other long term (current) drug therapy: Secondary | ICD-10-CM | POA: Insufficient documentation

## 2021-02-26 DIAGNOSIS — E2839 Other primary ovarian failure: Secondary | ICD-10-CM

## 2021-02-26 DIAGNOSIS — Z923 Personal history of irradiation: Secondary | ICD-10-CM | POA: Diagnosis not present

## 2021-02-26 LAB — CBC WITH DIFFERENTIAL (CANCER CENTER ONLY)
Abs Immature Granulocytes: 0.01 10*3/uL (ref 0.00–0.07)
Basophils Absolute: 0 10*3/uL (ref 0.0–0.1)
Basophils Relative: 1 %
Eosinophils Absolute: 0.1 10*3/uL (ref 0.0–0.5)
Eosinophils Relative: 1 %
HCT: 37.8 % (ref 36.0–46.0)
Hemoglobin: 12.8 g/dL (ref 12.0–15.0)
Immature Granulocytes: 0 %
Lymphocytes Relative: 23 %
Lymphs Abs: 1 10*3/uL (ref 0.7–4.0)
MCH: 32.9 pg (ref 26.0–34.0)
MCHC: 33.9 g/dL (ref 30.0–36.0)
MCV: 97.2 fL (ref 80.0–100.0)
Monocytes Absolute: 0.5 10*3/uL (ref 0.1–1.0)
Monocytes Relative: 11 %
Neutro Abs: 2.8 10*3/uL (ref 1.7–7.7)
Neutrophils Relative %: 64 %
Platelet Count: 243 10*3/uL (ref 150–400)
RBC: 3.89 MIL/uL (ref 3.87–5.11)
RDW: 12.9 % (ref 11.5–15.5)
WBC Count: 4.4 10*3/uL (ref 4.0–10.5)
nRBC: 0 % (ref 0.0–0.2)

## 2021-02-26 LAB — CMP (CANCER CENTER ONLY)
ALT: 17 U/L (ref 0–44)
AST: 19 U/L (ref 15–41)
Albumin: 4.2 g/dL (ref 3.5–5.0)
Alkaline Phosphatase: 63 U/L (ref 38–126)
Anion gap: 6 (ref 5–15)
BUN: 22 mg/dL (ref 8–23)
CO2: 28 mmol/L (ref 22–32)
Calcium: 9.6 mg/dL (ref 8.9–10.3)
Chloride: 104 mmol/L (ref 98–111)
Creatinine: 0.97 mg/dL (ref 0.44–1.00)
GFR, Estimated: 59 mL/min — ABNORMAL LOW (ref >60)
Glucose, Bld: 103 mg/dL — ABNORMAL HIGH (ref 70–99)
Potassium: 3.8 mmol/L (ref 3.5–5.1)
Sodium: 138 mmol/L (ref 135–145)
Total Bilirubin: 1.4 mg/dL — ABNORMAL HIGH (ref 0.3–1.2)
Total Protein: 7.4 g/dL (ref 6.5–8.1)

## 2021-02-26 NOTE — Progress Notes (Signed)
[facility]   [phone] [phone]   Clinic Follow up Note   Patient Care Team: Sharion Balloon, FNP as PCP - General (Family Medicine) Gordy Levan, MD (Hematology and Oncology)  Date of Service:  02/26/2021  CHIEF COMPLAINT: f/u of breast cancer  CURRENT THERAPY:  Surveillance  ASSESSMENT & PLAN:  Alison [name] is a 81 y.o. female with   1. Right upper outer quadrant breast invasive ductal carcinoma, stage IIIA, ER and PR positive, HER-2 negative -She was diagnosed in 2010 and treated with right breast lumpectomy, adjuvant AC-T, radiation and Anastrozole for 8 years.  -she is now under surveillance. -most recent mammogram 07/15/20 was negative. -She is clinically doing well. Lab reviewed, her CBC and CMP are within normal limits except Tbili 1.4. Her physical exam was unremarkable. There is no clinical concern for recurrence. -Continue surveillance. Next mammogram in 06/2020. She is over 10 years since her cancer diagnosis. She opted to continue yearly follow up with me in our clinic.  -Lab and F/u in one year.   2. H/o Left breast cancer  -dx in 1998, stage T2 with micrometastatic involvement of sentinel node  -treated with lumpectomy and axillary node evaluation, chemo, radiation, and a few years of tamoxifen    3. Bone Health  -06/2018 DEXA normal (T-score -0.4 in left hip). 07/15/20 DEXA stable. -Continue Calcium and Vit D. Continue DEXA scans every 2 years   4. HTN, arthritis, Anxiety  -She'll continue follow-up with her primary care physician. -ON Hyzaar and takes lexapro and ativan PRN.   5. Mild Hyperbilirubinemia  -She has had mild elevation in tbili since 2013 in [facility] records, mostly around 1.3 -CMP today shows tbili 1.4 (02/26/21).     PLAN:  -Continue breast cancer surveillance  -mammogram in 06/2021 -Lab and f/u in 1 year   No problem-specific Assessment & Plan notes found for this encounter.   SUMMARY OF  ONCOLOGIC HISTORY: 1. 1998 left T2 with micrometastatic involvement of sentinel node, treated with lumpectomy and axillary node evaluation, chemo/RT/tamoxifen (only for a few years).   2. Spring 2010 right T1bN2 (6 of 23 nodes involved) invasive ductal ER/PR + and HER 2 - treated with  lumpectomy + axillary nodes, adjuvant adria/cytoxan/taxol, radiation and aromatase inhibitor, anastrozole.     INTERVAL HISTORY:  Alison [name] is here for a follow up of breast cancer. She was last seen by me on 12/30/19. She presents to the clinic alone. She reports she had a UTI and a sinus infection fall of last year. She is now feeling better. No pain or other complains.    All other systems were reviewed with the patient and are negative.  MEDICAL HISTORY:  Past Medical History:  Diagnosis Date   Anxiety    Arthritis    Breast cancer ([place])    X2   Cervical dysplasia    GERD (gastroesophageal reflux disease)    takes otc meds   Glaucoma    Hyperlipidemia    Hypertension    Lichen sclerosus    Personal history of chemotherapy 2010   Personal history of radiation therapy 1999   Shingles 2012    SURGICAL HISTORY: Past Surgical History:  Procedure Laterality Date   BREAST BIOPSY Right 2010   BREAST LUMPECTOMY Right 2010   BREAST LUMPECTOMY Left    BREAST SURGERY     Lumpectomy right and left   GYNECOLOGIC CRYOSURGERY     HYSTEROSCOPY  PORT-A-CATH REMOVAL Left 01/31/2014   Procedure: REMOVAL PORT-A-CATH;  Surgeon: Autumn Messing III, MD;  Location: [facility];  Service: General;  Laterality: Left;   TUBAL LIGATION      I have reviewed the social history and family history with the patient and they are unchanged from previous note.  ALLERGIES:  has No Known Allergies.  MEDICATIONS:  Current Outpatient Medications  Medication Sig Dispense Refill   aspirin 81 MG tablet Take 81 mg by mouth daily.     Brinzolamide-Brimonidine 1-0.2 % SUSP Apply to eye.     Calcium Carbonate-Vitamin D (CALCIUM +  D PO) Take 2 capsules by mouth daily.      cefdinir (OMNICEF) 300 MG capsule Take 1 capsule (300 mg total) by mouth 2 (two) times daily. 1 po BID 20 capsule 0   cetirizine (ZYRTEC) 10 MG tablet Take 1 tablet (10 mg total) by mouth daily. 90 tablet 3   clobetasol cream (TEMOVATE) 0.05 % Apply nightly as needed for itching 30 g 2   docusate sodium (COLACE) 100 MG capsule Take 100 mg by mouth 2 (two) times daily as needed for mild constipation.     fluticasone (FLONASE) 50 MCG/ACT nasal spray SPRAY 2 SPRAYS INTO EACH NOSTRIL EVERY DAY 48 mL 3   KLOR-CON M20 20 MEQ tablet TAKE 1 TABLET BY MOUTH TWICE A DAY 180 tablet 1   latanoprost (XALATAN) 0.005 % ophthalmic solution 1 drop at bedtime.     losartan-hydrochlorothiazide (HYZAAR) 100-25 MG tablet TAKE 1 TABLET BY MOUTH EVERY DAY 90 tablet 0   meloxicam (MOBIC) 15 MG tablet TAKE 1 TABLET BY MOUTH EVERY DAY 90 tablet 0   metoprolol succinate (TOPROL-XL) 25 MG 24 hr tablet Take 0.5 tablets (12.5 mg total) by mouth in the morning and at bedtime. 90 tablet 3   Omega-3 Fatty Acids (FISH OIL PO) Take 1 capsule by mouth daily.      rosuvastatin (CRESTOR) 10 MG tablet TAKE 1 TABLET BY MOUTH EVERY DAY 90 tablet 0   No current facility-administered medications for this visit.    PHYSICAL EXAMINATION: ECOG PERFORMANCE STATUS: 0 - Asymptomatic  Vitals:   02/26/21 1013  BP: (!) 159/78  Pulse: 76  Resp: 18  Temp: 98.9 F (37.2 C)  SpO2: 100%   Wt Readings from Last 3 Encounters:  02/26/21 198 lb 3.2 oz (89.9 kg)  09/18/20 204 lb (92.5 kg)  09/15/20 203 lb 9.6 oz (92.4 kg)     GENERAL:alert, no distress and comfortable SKIN: skin color, texture, turgor are normal, no rashes or significant lesions EYES: normal, Conjunctiva are pink and non-injected, sclera clear  NECK: supple, thyroid normal size, non-tender, without nodularity LYMPH:  no palpable lymphadenopathy in the cervical, axillary  LUNGS: clear to auscultation and percussion with normal  breathing effort HEART: regular rate & rhythm and no murmurs and no lower extremity edema ABDOMEN:abdomen soft, non-tender and normal bowel sounds Musculoskeletal:no cyanosis of digits and no clubbing  NEURO: alert & oriented x 3 with fluent speech, no focal motor/sensory deficits BREAST: No palpable mass, nodules or adenopathy bilaterally. Breast exam benign.   LABORATORY DATA:  I have reviewed the data as listed CBC Latest Ref Rng & Units 02/26/2021 03/16/2020 12/30/2019  WBC 4.0 - 10.5 K/uL 4.4 4.2 5.0  Hemoglobin 12.0 - 15.0 g/dL 12.8 13.0 12.5  Hematocrit 36.0 - 46.0 % 37.8 39.2 38.0  Platelets 150 - 400 K/uL 243 260 223     CMP Latest Ref Rng & Units 02/26/2021  09/15/2020 03/16/2020  Glucose 70 - 99 mg/dL 103(H) 95 98  BUN 8 - 23 mg/dL '22 18 12  ' Creatinine 0.44 - 1.00 mg/dL 0.97 0.97 0.99  Sodium 135 - 145 mmol/L 138 141 141  Potassium 3.5 - 5.1 mmol/L 3.8 4.0 4.1  Chloride 98 - 111 mmol/L 104 104 103  CO2 22 - 32 mmol/L '28 21 23  ' Calcium 8.9 - 10.3 mg/dL 9.6 9.5 9.6  Total Protein 6.5 - 8.1 g/dL 7.4 6.7 7.4  Total Bilirubin 0.3 - 1.2 mg/dL 1.4(H) 0.9 1.2  Alkaline Phos 38 - 126 U/L 63 71 74  AST 15 - 41 U/L '19 23 26  ' ALT 0 - 44 U/L '17 16 21      ' RADIOGRAPHIC STUDIES: I have personally reviewed the radiological images as listed and agreed with the findings in the report. No results found.    Orders Placed This Encounter  Procedures   MM Digital Screening    Standing Status:   Future    Standing Expiration Date:   02/26/2022    Order Specific Question:   Reason for Exam (SYMPTOM  OR DIAGNOSIS REQUIRED)    Answer:   screening    Order Specific Question:   Preferred imaging location?    Answer:   [facility]   All questions were answered. The patient knows to call the clinic with any problems, questions or concerns. No barriers to learning was detected. The total time spent in the appointment was 20 minutes.     Truitt Merle, MD 02/26/2021   I, Wilburn Mylar, am  acting as scribe for Truitt Merle, MD.   I have reviewed the above documentation for accuracy and completeness, and I agree with the above.

## 2021-03-05 ENCOUNTER — Other Ambulatory Visit: Payer: Self-pay | Admitting: Family

## 2021-03-05 DIAGNOSIS — M170 Bilateral primary osteoarthritis of knee: Secondary | ICD-10-CM

## 2021-03-05 DIAGNOSIS — I10 Essential (primary) hypertension: Secondary | ICD-10-CM

## 2021-03-08 ENCOUNTER — Other Ambulatory Visit: Payer: Self-pay | Admitting: Family

## 2021-03-18 ENCOUNTER — Ambulatory Visit: Payer: Medicare HMO | Admitting: Family

## 2021-03-30 ENCOUNTER — Encounter: Payer: Self-pay | Admitting: Family

## 2021-03-30 ENCOUNTER — Ambulatory Visit (INDEPENDENT_AMBULATORY_CARE_PROVIDER_SITE_OTHER): Payer: Medicare HMO | Admitting: Family

## 2021-03-30 VITALS — BP 150/68 | HR 66 | Temp 97.0°F | Ht 60.0 in | Wt 199.0 lb

## 2021-03-30 DIAGNOSIS — M170 Bilateral primary osteoarthritis of knee: Secondary | ICD-10-CM | POA: Diagnosis not present

## 2021-03-30 DIAGNOSIS — M5416 Radiculopathy, lumbar region: Secondary | ICD-10-CM | POA: Diagnosis not present

## 2021-03-30 DIAGNOSIS — R69 Illness, unspecified: Secondary | ICD-10-CM | POA: Diagnosis not present

## 2021-03-30 DIAGNOSIS — K5900 Constipation, unspecified: Secondary | ICD-10-CM

## 2021-03-30 DIAGNOSIS — E782 Mixed hyperlipidemia: Secondary | ICD-10-CM | POA: Diagnosis not present

## 2021-03-30 DIAGNOSIS — I10 Essential (primary) hypertension: Secondary | ICD-10-CM

## 2021-03-30 DIAGNOSIS — Z853 Personal history of malignant neoplasm of breast: Secondary | ICD-10-CM | POA: Diagnosis not present

## 2021-03-30 DIAGNOSIS — F419 Anxiety disorder, unspecified: Secondary | ICD-10-CM

## 2021-03-30 DIAGNOSIS — Z23 Encounter for immunization: Secondary | ICD-10-CM

## 2021-03-30 DIAGNOSIS — F320 Major depressive disorder, single episode, mild: Secondary | ICD-10-CM

## 2021-03-30 MED ORDER — POLYETHYLENE GLYCOL 3350 17 GM/SCOOP PO POWD: 17.0000 g | Freq: Two times a day (BID) | ORAL | 1 refills | Status: DC | PRN

## 2021-03-30 NOTE — Patient Instructions (Signed)

## 2021-03-30 NOTE — Progress Notes (Signed)
? ?Subjective:  ? ? Patient ID: Alison [name], female    DOB: 07/05/1940, 81 y.o.   MRN: [id number] ? ?Chief Complaint  ?Patient presents with  ? Medical Management of Chronic Issues  ? Knee Pain  ?  Want ortho   ? ?PT presents to the office today for chronic follow up. She is followed by Oncologists for Left breast cancer in 1998 and right breast cancer 2010.  ?Knee Pain  ?The incident occurred more than 1 week ago. There was no injury mechanism. The pain is present in the right knee and left knee. The pain is at a severity of 8/10. The pain is moderate. The pain has been Constant since onset. Associated symptoms include muscle weakness. She reports no foreign bodies present. The symptoms are aggravated by weight bearing. She has tried NSAIDs and non-weight bearing for the symptoms. The treatment provided mild relief.  ?Hypertension ?This is a chronic problem. The current episode started more than 1 year ago. The problem has been waxing and waning since onset. The problem is uncontrolled. Associated symptoms include anxiety and peripheral edema. Pertinent negatives include no malaise/fatigue or shortness of breath. Risk factors for coronary artery disease include dyslipidemia, obesity and sedentary lifestyle. The current treatment provides moderate improvement.  ?Arthritis ?Presents for follow-up visit. She complains of pain and stiffness. The symptoms have been stable. Affected locations include the left knee, right knee, left MCP and right MCP. Her pain is at a severity of 8/10.  ?Hyperlipidemia ?This is a chronic problem. The current episode started more than 1 year ago. The problem is controlled. Recent lipid tests were reviewed and are normal. Exacerbating diseases include obesity. Pertinent negatives include no shortness of breath. Current antihyperlipidemic treatment includes statins. The current treatment provides moderate improvement of lipids. Risk factors for coronary artery disease include dyslipidemia,  hypertension, a sedentary lifestyle and post-menopausal.  ?Depression ?       This is a chronic problem.  The current episode started more than 1 year ago.   The problem occurs intermittently.  Associated symptoms include restlessness and sad.  Associated symptoms include no helplessness and no hopelessness.  Past treatments include nothing.  Past medical history includes anxiety.   ?Anxiety ?Presents for follow-up visit. Symptoms include depressed mood, excessive worry, irritability, nervous/anxious behavior and restlessness. Patient reports no shortness of breath. Symptoms occur occasionally. The severity of symptoms is moderate.  ? ? ?Constipation ?This is a chronic problem. The current episode started more than 1 year ago. Her stool frequency is 2 to 3 times per week. Risk factors include obesity. She has tried laxatives for the symptoms. The treatment provided moderate relief.  ? ? ? ?Review of Systems  ?Constitutional:  Positive for irritability. Negative for malaise/fatigue.  ?Respiratory:  Negative for shortness of breath.   ?Gastrointestinal:  Positive for constipation.  ?Musculoskeletal:  Positive for arthritis and stiffness.  ?Psychiatric/Behavioral:  Positive for depression. The patient is nervous/anxious.   ?All other systems reviewed and are negative. ? ?   ?Objective:  ? Physical Exam ?Vitals reviewed.  ?Constitutional:   ?   General: She is not in acute distress. ?   Appearance: She is well-developed. She is obese.  ?HENT:  ?   Head: Normocephalic and atraumatic.  ?   Right Ear: Tympanic membrane normal.  ?   Left Ear: Tympanic membrane normal.  ?Eyes:  ?   Pupils: Pupils are equal, round, and reactive to light.  ?Neck:  ?   Thyroid: No  thyromegaly.  ?Cardiovascular:  ?   Rate and Rhythm: Normal rate and regular rhythm.  ?   Heart sounds: Normal heart sounds. No murmur heard. ?Pulmonary:  ?   Effort: Pulmonary effort is normal. No respiratory distress.  ?   Breath sounds: Normal breath sounds. No  wheezing.  ?Abdominal:  ?   General: Bowel sounds are normal. There is no distension.  ?   Palpations: Abdomen is soft.  ?   Tenderness: There is no abdominal tenderness.  ?Musculoskeletal:     ?   General: Tenderness (mild pain with bilateral knee with flexion) present. Normal range of motion.  ?   Cervical back: Normal range of motion and neck supple.  ?Skin: ?   General: Skin is warm and dry.  ?Neurological:  ?   Mental Status: She is alert and oriented to person, place, and time.  ?   Cranial Nerves: No cranial nerve deficit.  ?   Deep Tendon Reflexes: Reflexes are normal and symmetric.  ?Psychiatric:     ?   Behavior: Behavior normal.     ?   Thought Content: Thought content normal.     ?   Judgment: Judgment normal.  ? ? ? ? ?BP (!) 150/68   Pulse 66   Temp (!) 97 ?F (36.1 ?C) (Temporal)   Ht 5' (1.524 m)   Wt 199 lb (90.3 kg)   LMP 01/25/2004   BMI 38.86 kg/m?  ? ?   ?Assessment & Plan:  ?Alison [name] comes in today with chief complaint of Medical Management of Chronic Issues and Knee Pain (Want ortho ) ? ? ?Diagnosis and orders addressed: ? ?1. Hypertension with goal blood pressure less than 130/80 ? ?2. Left lumbar radiculopathy ? ?3. Primary osteoarthritis of both knees ?- AMB referral to orthopedics ? ?4. Moderate mixed hyperlipidemia not requiring statin therapy ? ?5. Depression, major, single episode, mild ([place]) ? ?6. Anxiety ? ?7. HX: breast cancer ? ?8. Constipation, unspecified constipation type ?Start Miralax ?- polyethylene glycol powder (GLYCOLAX/MIRALAX) 17 GM/SCOOP powder; Take 17 g by mouth 2 (two) times daily as needed.  Dispense: 3350 g; Refill: 1 ? ?9. Need for shingles vaccine ?- Varicella-zoster vaccine IM (Shingrix) ? ? ?Labs reviewed from last month ?Health Maintenance reviewed ?Diet and exercise encouraged ? ?Follow up plan: ?3 months  ? ?Evelina Dun, FNP ? ? ?

## 2021-04-05 ENCOUNTER — Ambulatory Visit: Payer: Self-pay | Admitting: Orthopedic Surgery

## 2021-05-13 ENCOUNTER — Other Ambulatory Visit: Payer: Self-pay | Admitting: Family

## 2021-05-13 DIAGNOSIS — E782 Mixed hyperlipidemia: Secondary | ICD-10-CM

## 2021-05-17 ENCOUNTER — Ambulatory Visit: Payer: Self-pay | Admitting: Orthopedic Surgery

## 2021-06-05 ENCOUNTER — Other Ambulatory Visit: Payer: Self-pay | Admitting: Family

## 2021-06-05 DIAGNOSIS — I10 Essential (primary) hypertension: Secondary | ICD-10-CM

## 2021-06-17 ENCOUNTER — Other Ambulatory Visit: Payer: Self-pay | Admitting: Family

## 2021-06-17 DIAGNOSIS — M170 Bilateral primary osteoarthritis of knee: Secondary | ICD-10-CM

## 2021-06-27 ENCOUNTER — Other Ambulatory Visit: Payer: Self-pay

## 2021-06-27 ENCOUNTER — Encounter: Payer: Self-pay | Admitting: Emergency Medicine

## 2021-06-27 ENCOUNTER — Emergency Department
Admission: EM | Admit: 2021-06-27 | Discharge: 2021-06-27 | Disposition: A | Discharge status: Home / Self Care | Payer: Medicare HMO | Source: Home / Self Care

## 2021-06-27 DIAGNOSIS — R0981 Nasal congestion: Secondary | ICD-10-CM

## 2021-06-27 DIAGNOSIS — R350 Frequency of micturition: Secondary | ICD-10-CM | POA: Diagnosis not present

## 2021-06-27 DIAGNOSIS — R3915 Urgency of urination: Secondary | ICD-10-CM

## 2021-06-27 LAB — POCT URINALYSIS DIP (MANUAL ENTRY)
Bilirubin, UA: NEGATIVE
Glucose, UA: NEGATIVE mg/dL
Ketones, POC UA: NEGATIVE mg/dL
Leukocytes, UA: NEGATIVE
Nitrite, UA: NEGATIVE
Protein Ur, POC: NEGATIVE mg/dL
Spec Grav, UA: 1.015 (ref 1.010–1.025)
Urobilinogen, UA: 0.2 E.U./dL
pH, UA: 6 (ref 5.0–8.0)

## 2021-06-27 MED ORDER — CEPHALEXIN 500 MG PO CAPS: 500.0000 mg | ORAL_CAPSULE | Freq: Two times a day (BID) | ORAL | 0 refills | Status: DC

## 2021-06-27 NOTE — Discharge Instructions (Signed)
Your urine was not convincing for an infection but I am concerned that you are continuing to have symptoms after 1 week.  We will send this off for culture and contact you if we need to change her treatment plan.  We are going to use an antibiotic that should cover for a urinary tract infection as well as sinuses.  Start Keflex 500 mg twice daily for 7 days.  Continue your allergy medication as previously prescribed and make sure you are drinking plenty of fluid.  As we discussed, you do have some blood in your urine and I recommend that you follow-up with your PCP within a few weeks.  If this continues you may need to see a urologist for further evaluation and management.  If anything worsens please return immediately for further evaluation.

## 2021-06-27 NOTE — ED Provider Notes (Signed)
[name]    CSN: [id number] Arrival date & time: 06/27/21  1140      History   Chief Complaint Chief Complaint  Patient presents with   Urinary Frequency   Nasal Congestion    HPI Alison [name] is a 81 y.o. female.   Patient presents today with several concerns.  Her primary concern today is a weeklong history of UTI symptoms including mild dysuria, frequency, urgency, lower back pain.  She does have a history of chronic lower back pain but states current symptoms are slightly different than typical pain.  She has not tried any over-the-counter medication for symptom management.  She does report that she was treated in January for UTI and had resolution with antibiotics.  She has not seen a urologist.  She denies history of nephrolithiasis, single kidney, self-catheterization.  Denies any recent urogenital procedure.  Denies abdominal pain, diarrhea, fever, nausea, vomiting, pelvic pain.  In addition, patient reports a weeklong history of URI symptoms.  She initially thought these were related to her allergies and has been using allergy medication as previously prescribed but continues to have significant sinus congestion and mild cough.  She denies any fever, chest pain, shortness of breath, nausea, vomiting.  She has not tried additional over-the-counter medication with the exception of her allergy medicine.  Denies any known sick contacts.  She denies history of diabetes, asthma, COPD.   Past Medical History:  Diagnosis Date   Anxiety    Arthritis    Breast cancer ([place])    X2   Cervical dysplasia    GERD (gastroesophageal reflux disease)    takes otc meds   Glaucoma    Hyperlipidemia    Hypertension    Lichen sclerosus    Personal history of chemotherapy 2010   Personal history of radiation therapy 1999   Shingles 2012    Patient Active Problem List   Diagnosis Date Noted   Constipation 03/30/2021   Anxiety 08/14/2018   Left lumbar radiculopathy  06/16/2015   Trochanteric bursitis of left hip 06/04/2015   HX: breast cancer 01/01/2015   Depression, major, single episode, mild ([place]) 06/13/2013   Hyperlipidemia 06/13/2013   Hypertension with goal blood pressure less than 130/80 06/13/2013   Primary osteoarthritis of both knees 04/17/2013   Tendinitis, de Quervain's 94/70/9628   Lichen sclerosus     Past Surgical History:  Procedure Laterality Date   BREAST BIOPSY Right 2010   BREAST LUMPECTOMY Right 2010   BREAST LUMPECTOMY Left    BREAST SURGERY     Lumpectomy right and left   GYNECOLOGIC CRYOSURGERY     HYSTEROSCOPY     PORT-A-CATH REMOVAL Left 01/31/2014   Procedure: REMOVAL PORT-A-CATH;  Surgeon: Autumn Messing III, MD;  Location: [facility];  Service: General;  Laterality: Left;   TUBAL LIGATION      OB History     Gravida  3   Para  3   Term  3   Preterm      AB      Living  3      SAB      IAB      Ectopic      Multiple      Live Births               Home Medications    Prior to Admission medications   Medication Sig Start Date End Date Taking? Authorizing Provider  aspirin 81 MG tablet Take 81 mg by  mouth daily.   Yes [provider]  Brinzolamide-Brimonidine 1-0.2 % SUSP Apply to eye.   Yes [provider]  Calcium Carbonate-Vitamin D (CALCIUM + D PO) Take 2 capsules by mouth daily.    Yes [provider]  cephALEXin (KEFLEX) 500 MG capsule Take 1 capsule (500 mg total) by mouth 2 (two) times daily. 06/27/21  Yes Niccolas Loeper K, PA-C  cetirizine (ZYRTEC) 10 MG tablet Take 1 tablet (10 mg total) by mouth daily. 11/12/18  Yes Terald Sleeper, PA-C  clobetasol cream (TEMOVATE) 0.05 % Apply nightly as needed for itching 06/06/13  Yes Fontaine, Belinda Block, MD  docusate sodium (COLACE) 100 MG capsule Take 100 mg by mouth 2 (two) times daily as needed for mild constipation.   Yes [provider]  fluticasone (FLONASE) 50 MCG/ACT nasal spray SPRAY 2 SPRAYS INTO EACH NOSTRIL  EVERY DAY 01/26/21  Yes Hawks, Christy A, FNP  KLOR-CON M20 20 MEQ tablet TAKE 1 TABLET BY MOUTH TWICE A DAY 06/18/21  Yes Hawks, Christy A, FNP  latanoprost (XALATAN) 0.005 % ophthalmic solution 1 drop at bedtime.   Yes [provider]  losartan-hydrochlorothiazide (HYZAAR) 100-25 MG tablet TAKE 1 TABLET BY MOUTH EVERY DAY 06/07/21  Yes Hawks, Christy A, FNP  meloxicam (MOBIC) 15 MG tablet TAKE 1 TABLET BY MOUTH EVERY DAY 06/18/21  Yes Hawks, Christy A, FNP  metoprolol succinate (TOPROL-XL) 25 MG 24 hr tablet Take 0.5 tablets (12.5 mg total) by mouth in the morning and at bedtime. 09/15/20  Yes Hawks, Christy A, FNP  Omega-3 Fatty Acids (FISH OIL PO) Take 1 capsule by mouth daily.    Yes [provider]  polyethylene glycol powder (GLYCOLAX/MIRALAX) 17 GM/SCOOP powder Take 17 g by mouth 2 (two) times daily as needed. 03/30/21  Yes Hawks, Christy A, FNP  rosuvastatin (CRESTOR) 10 MG tablet TAKE 1 TABLET BY MOUTH EVERY DAY 05/13/21  Yes Hawks, Theador Hawthorne, FNP    Family History Family History  Problem Relation Age of Onset   Hypertension Mother    Cancer Mother        colon   Colon cancer Mother    Diabetes Sister    Heart disease Father    Breast cancer Maternal Aunt 22   Breast cancer Maternal Aunt     Social History Social History   Tobacco Use   Smoking status: Never   Smokeless tobacco: Never  Vaping Use   Vaping Use: Never used  Substance Use Topics   Alcohol use: Yes    Comment: Rare   Drug use: No     Allergies   Patient has no known allergies.   Review of Systems Review of Systems  Constitutional:  Positive for activity change. Negative for appetite change, fatigue and fever.  HENT:  Positive for congestion and sinus pressure. Negative for sneezing and sore throat.   Respiratory:  Positive for cough. Negative for shortness of breath.   Cardiovascular:  Negative for chest pain.  Gastrointestinal:  Negative for abdominal pain, diarrhea, nausea and  vomiting.  Genitourinary:  Positive for dysuria, frequency and urgency. Negative for pelvic pain, vaginal bleeding, vaginal discharge and vaginal pain.  Musculoskeletal:  Positive for back pain. Negative for arthralgias and myalgias.  Neurological:  Negative for dizziness, light-headedness and headaches.    Physical Exam Triage Vital Signs ED Triage Vitals  Enc Vitals Group     BP 06/27/21 1159 (!) 165/90     Pulse Rate 06/27/21 1159 78  Resp 06/27/21 1159 16     Temp 06/27/21 1159 98.5 F (36.9 C)     Temp Source 06/27/21 1159 Oral     SpO2 06/27/21 1159 98 %     Weight --      Height --      Head Circumference --      Peak Flow --      Pain Score 06/27/21 1157 3     Pain Loc --      Pain Edu? --      Excl. in [place]? --    No data found.  Updated Vital Signs BP (!) 165/90 (BP Location: Right Arm)   Pulse 78   Temp 98.5 F (36.9 C) (Oral)   Resp 16   LMP 01/25/2004   SpO2 98%   Visual Acuity Right Eye Distance:   Left Eye Distance:   Bilateral Distance:    Right Eye Near:   Left Eye Near:    Bilateral Near:     Physical Exam Vitals reviewed.  Constitutional:      General: She is awake. She is not in acute distress.    Appearance: Normal appearance. She is well-developed. She is not ill-appearing.     Comments: Very pleasant female appears stated age in no acute distress sitting comfortably in exam room  HENT:     Head: Normocephalic and atraumatic.     Right Ear: Tympanic membrane, ear canal and external ear normal. Tympanic membrane is not erythematous or bulging.     Left Ear: Tympanic membrane, ear canal and external ear normal. Tympanic membrane is not erythematous or bulging.     Nose:     Right Sinus: Maxillary sinus tenderness present. No frontal sinus tenderness.     Left Sinus: Maxillary sinus tenderness present. No frontal sinus tenderness.     Mouth/Throat:     Pharynx: Uvula midline. Posterior oropharyngeal erythema present. No oropharyngeal  exudate.     Comments: Erythema and drainage in posterior oropharynx Cardiovascular:     Rate and Rhythm: Normal rate and regular rhythm.     Heart sounds: Normal heart sounds, S1 normal and S2 normal. No murmur heard. Pulmonary:     Effort: Pulmonary effort is normal.     Breath sounds: Normal breath sounds. No wheezing, rhonchi or rales.     Comments: Clear to auscultation bilaterally Abdominal:     General: Bowel sounds are normal.     Palpations: Abdomen is soft.     Tenderness: There is no abdominal tenderness. There is no right CVA tenderness, left CVA tenderness, guarding or rebound.     Comments: Benign abdominal exam  Psychiatric:        Behavior: Behavior is cooperative.     UC Treatments / Results  Labs (all labs ordered are listed, but only abnormal results are displayed) Labs Reviewed  POCT URINALYSIS DIP (MANUAL ENTRY) - Abnormal; Notable for the following components:      Result Value   Blood, UA trace-intact (*)    All other components within normal limits  URINE CULTURE    EKG   Radiology No results found.  Procedures Procedures (including critical care time)  Medications Ordered in UC Medications - No data to display  Initial Impression / Assessment and Plan / UC Course  I have reviewed the triage vital signs and the nursing notes.  Pertinent labs & imaging results that were available during my care of the patient were reviewed by me and considered in  my medical decision making (see chart for details).     UA was obtained that showed trace blood but was otherwise normal.  Concern for secondary bacterial infection of sinuses given prolonged and worsening symptoms.  No indication for viral testing as patient has been symptomatic for over a week and this would not change management.  Will cover for both UTI and sinuses with Keflex.  No indication for dose adjustment based on 02/26/2021 CMP that showed creatinine of 0.97 and calculated creatinine clearance  of 65 mL/min.  Patient was encouraged to continue allergy medication and over-the-counter medicines as needed for symptom relief.  She is to rest and drink plenty of fluid.  We did discuss that she has blood noted in her urine and she should follow-up with her PCP.  If this persists she would need to consider referral to urologist given persistent hematuria.  Urine culture was obtained and we discussed potential need to change antibiotics based on susceptibilities identified on culture.  Discussed that if she has any worsening symptoms she needs to be seen immediately to which she expressed understanding.  Final Clinical Impressions(s) / UC Diagnoses   Final diagnoses:  Urinary frequency  Urinary urgency  Sinus congestion     Discharge Instructions      Your urine was not convincing for an infection but I am concerned that you are continuing to have symptoms after 1 week.  We will send this off for culture and contact you if we need to change her treatment plan.  We are going to use an antibiotic that should cover for a urinary tract infection as well as sinuses.  Start Keflex 500 mg twice daily for 7 days.  Continue your allergy medication as previously prescribed and make sure you are drinking plenty of fluid.  As we discussed, you do have some blood in your urine and I recommend that you follow-up with your PCP within a few weeks.  If this continues you may need to see a urologist for further evaluation and management.  If anything worsens please return immediately for further evaluation.     ED Prescriptions     Medication Sig Dispense Auth. Provider   cephALEXin (KEFLEX) 500 MG capsule Take 1 capsule (500 mg total) by mouth 2 (two) times daily. 14 capsule Gwendlyn Hanback K, PA-C      PDMP not reviewed this encounter.   Terrilee Croak, PA-C 06/27/21 1225

## 2021-06-27 NOTE — ED Triage Notes (Signed)
Patient presents to Urgent Care with complaints of urinary frequency, dysuria, low back pain since 1 week ago. Patient reports does hold her urine until the last minute before going to the bathroom. She doesn't drink a lot of water. Denies any fever or chills.    Patient also c/o runny nose, postnasal drainage down her throat. She does allergy spray and allergy pills.

## 2021-06-29 LAB — URINE CULTURE
MICRO NUMBER:: 13482252
SPECIMEN QUALITY:: ADEQUATE

## 2021-07-02 ENCOUNTER — Ambulatory Visit (INDEPENDENT_AMBULATORY_CARE_PROVIDER_SITE_OTHER): Payer: Medicare HMO | Admitting: Family

## 2021-07-02 ENCOUNTER — Encounter: Payer: Self-pay | Admitting: Family

## 2021-07-02 VITALS — BP 127/64 | HR 68 | Temp 97.6°F | Ht 64.0 in | Wt 198.2 lb

## 2021-07-02 DIAGNOSIS — I10 Essential (primary) hypertension: Secondary | ICD-10-CM

## 2021-07-02 DIAGNOSIS — F320 Major depressive disorder, single episode, mild: Secondary | ICD-10-CM | POA: Diagnosis not present

## 2021-07-02 DIAGNOSIS — Z853 Personal history of malignant neoplasm of breast: Secondary | ICD-10-CM

## 2021-07-02 DIAGNOSIS — M170 Bilateral primary osteoarthritis of knee: Secondary | ICD-10-CM

## 2021-07-02 DIAGNOSIS — F419 Anxiety disorder, unspecified: Secondary | ICD-10-CM

## 2021-07-02 DIAGNOSIS — K5900 Constipation, unspecified: Secondary | ICD-10-CM

## 2021-07-02 DIAGNOSIS — R69 Illness, unspecified: Secondary | ICD-10-CM | POA: Diagnosis not present

## 2021-07-02 DIAGNOSIS — E782 Mixed hyperlipidemia: Secondary | ICD-10-CM | POA: Diagnosis not present

## 2021-07-02 DIAGNOSIS — Z23 Encounter for immunization: Secondary | ICD-10-CM

## 2021-07-02 MED ORDER — ESCITALOPRAM OXALATE 5 MG PO TABS: 5.0000 mg | ORAL_TABLET | Freq: Every day | ORAL | 1 refills | Status: DC

## 2021-07-02 NOTE — Progress Notes (Signed)
Subjective:    Patient ID: Alison [name], female    DOB: 12/18/1940, 81 y.o.   MRN: [id number]  Chief Complaint  Patient presents with   ER Followup   PT presents to the office today for chronic follow up. She is followed by Oncologists for Left breast cancer in 1998 and right breast cancer 2010.  Hypertension This is a chronic problem. The current episode started more than 1 year ago. The problem has been resolved since onset. The problem is controlled. Associated symptoms include anxiety. Pertinent negatives include no malaise/fatigue, peripheral edema or shortness of breath. Risk factors for coronary artery disease include dyslipidemia, obesity and sedentary lifestyle. The current treatment provides moderate improvement. There is no history of CVA or heart failure.  Arthritis Presents for follow-up visit. She complains of pain and stiffness. The symptoms have been stable. Affected locations include the left knee and right knee. Her pain is at a severity of 8/10.  Hyperlipidemia This is a chronic problem. The current episode started more than 1 year ago. The problem is controlled. Recent lipid tests were reviewed and are normal. Exacerbating diseases include obesity. Pertinent negatives include no shortness of breath. Current antihyperlipidemic treatment includes statins. The current treatment provides moderate improvement of lipids. Risk factors for coronary artery disease include dyslipidemia, hypertension, a sedentary lifestyle and post-menopausal.  Depression        This is a chronic problem.  The current episode started more than 1 year ago.   The onset quality is gradual.   The problem occurs intermittently.  Associated symptoms include hopelessness, decreased interest and sad.  Associated symptoms include no helplessness.  Past treatments include nothing.  Past medical history includes anxiety.   Anxiety Presents for follow-up visit. Symptoms include excessive worry and nervous/anxious  behavior. Patient reports no shortness of breath. Symptoms occur occasionally. The severity of symptoms is mild.    Constipation This is a chronic problem. The current episode started more than 1 year ago. The problem has been waxing and waning since onset. Her stool frequency is 2 to 3 times per week. Risk factors include obesity. She has tried stool softeners and fiber for the symptoms. The treatment provided moderate relief.      Review of Systems  Constitutional:  Negative for malaise/fatigue.  Respiratory:  Negative for shortness of breath.   Gastrointestinal:  Positive for constipation.  Musculoskeletal:  Positive for arthritis and stiffness.  Psychiatric/Behavioral:  Positive for depression. The patient is nervous/anxious.   All other systems reviewed and are negative.      Objective:   Physical Exam Vitals reviewed.  Constitutional:      General: She is not in acute distress.    Appearance: She is well-developed. She is obese.  HENT:     Head: Normocephalic and atraumatic.     Right Ear: Tympanic membrane normal.     Left Ear: Tympanic membrane normal.  Eyes:     Pupils: Pupils are equal, round, and reactive to light.  Neck:     Thyroid: No thyromegaly.  Cardiovascular:     Rate and Rhythm: Normal rate and regular rhythm.     Heart sounds: Normal heart sounds. No murmur heard. Pulmonary:     Effort: Pulmonary effort is normal. No respiratory distress.     Breath sounds: Normal breath sounds. No wheezing.  Abdominal:     General: Bowel sounds are normal. There is no distension.     Palpations: Abdomen is soft.  Tenderness: There is no abdominal tenderness.  Musculoskeletal:        General: No tenderness. Normal range of motion.     Cervical back: Normal range of motion and neck supple.  Skin:    General: Skin is warm and dry.  Neurological:     Mental Status: She is alert and oriented to person, place, and time.     Cranial Nerves: No cranial nerve deficit.      Deep Tendon Reflexes: Reflexes are normal and symmetric.  Psychiatric:        Behavior: Behavior normal.        Thought Content: Thought content normal.        Judgment: Judgment normal.     BP 127/64   Pulse 68   Temp 97.6 F (36.4 C)   Ht _0  (1.626 m)   Wt 198 lb 3.2 oz (89.9 kg)   LMP 01/25/2004   SpO2 100%   BMI 34.02 kg/m      Assessment & Plan:  THERESIA PREE comes in today with chief complaint of ER Followup   Diagnosis and orders addressed:  1. Need for shingles vaccine - Varicella-zoster vaccine IM (Shingrix) - CMP14+EGFR - CBC with Differential/Platelet  2. Hypertension with goal blood pressure less than 130/80 - CMP14+EGFR - CBC with Differential/Platelet  3. Primary osteoarthritis of both knees - CMP14+EGFR - CBC with Differential/Platelet  4. Moderate mixed hyperlipidemia not requiring statin therapy - CMP14+EGFR - CBC with Differential/Platelet  5. HX: breast cancer - CMP14+EGFR - CBC with Differential/Platelet  6. Depression, major, single episode, mild ([facility]) Restart Lexapro 5 mg  Take daily - CMP14+EGFR - CBC with Differential/Platelet - escitalopram (LEXAPRO) 5 MG tablet; Take 1 tablet (5 mg total) by mouth daily.  Dispense: 90 tablet; Refill: 1  7. Constipation, unspecified constipation type - CMP14+EGFR - CBC with Differential/Platelet  8. Anxiety  - CMP14+EGFR - CBC with Differential/Platelet - escitalopram (LEXAPRO) 5 MG tablet; Take 1 tablet (5 mg total) by mouth daily.  Dispense: 90 tablet; Refill: 1   Labs pending Health Maintenance reviewed Diet and exercise encouraged  Follow up plan: 2 months recheck GAD and depression   Evelina Dun, FNP

## 2021-07-02 NOTE — Patient Instructions (Signed)
Major Depressive Disorder, Adult Major depressive disorder (MDD) is a mental health condition. It may also be called clinical depression or unipolar depression. MDD causes symptoms of sadness, hopelessness, and loss of interest in things. These symptoms last most of the day, almost every day, for 2 weeks. MDD can also cause physical symptoms. It can interfere with relationships and with everyday activities, such as work, school, and activities that are usually pleasant. MDD may be mild, moderate, or severe. It may be single-episode MDD, which happens once, or recurrent MDD, which may occur multiple times. What are the causes? The exact cause of this condition is not known. MDD is most likely caused by a combination of things, which may include: Your personality traits. Learned or conditioned behaviors or thoughts or feelings that reinforce negativity. Any alcohol or substance misuse. Long-term (chronic) physical or mental health illness. Going through a traumatic experience or major life changes. What increases the risk? The following factors may make someone more likely to develop MDD: A family history of depression. Being a woman. Troubled family relationships. Abnormally low levels of certain brain chemicals. Traumatic or painful events in childhood, especially abuse or loss of a parent. A lot of stress from life experiences, such as poor living conditions or discrimination. Chronic physical illness or other mental health disorders. What are the signs or symptoms? The main symptoms of MDD usually include: Constant depressed or irritable mood. A loss of interest in things and activities. Other symptoms include: Sleeping or eating too much or too little. Unexplained weight gain or weight loss. Tiredness or low energy. Being agitated, restless, or weak. Feeling hopeless, worthless, or guilty. Trouble thinking clearly or making decisions. Thoughts of suicide or thoughts of harming  others. Isolating oneself or avoiding other people or activities. Trouble completing tasks, work, or any normal obligations. Severe symptoms of this condition may include: Psychotic depression.This may include false beliefs, or delusions. It may also include seeing, hearing, tasting, smelling, or feeling things that are not real (hallucinations). Chronic depression or persistent depressive disorder. This is low-level depression that lasts for at least 2 years. Melancholic depression, or feeling extremely sad and hopeless. Catatonic depression, which includes trouble speaking and trouble moving. How is this diagnosed? This condition may be diagnosed based on: Your symptoms. Your medical and mental health history. You may be asked questions about your lifestyle, including any drug and alcohol use. A physical exam. Blood tests to rule out other conditions. MDD is confirmed if you have the following symptoms most of the day, nearly every day, in a 2-week period: Either a depressed mood or loss of interest. At least four other MDD symptoms. How is this treated? This condition is usually treated by mental health professionals, such as psychologists, psychiatrists, and clinical social workers. You may need more than one type of treatment. Treatment may include: Psychotherapy, also called talk therapy or counseling. Types of psychotherapy include: Cognitive behavioral therapy (CBT). This teaches you to recognize unhealthy feelings, thoughts, and behaviors, and replace them with positive thoughts and actions. Interpersonal therapy (IPT). This helps you to improve the way you communicate with others or relate to them. Family therapy. This treatment includes members of your family. Medicines to treat anxiety and depression. These medicines help to balance the brain chemicals that affect your emotions. Lifestyle changes. You may be asked to: Limit alcohol use and avoid drug use. Get regular  exercise. Get plenty of sleep. Make healthy eating choices. Spend more time outdoors. Brain stimulation. This may   be done if symptoms are very severe and other treatments have not worked. Examples of this treatment are electroconvulsive therapy and transcranial magnetic stimulation. Follow these instructions at home: Activity Exercise regularly and spend time outdoors. Find activities that you enjoy doing, and make time to do them. Find healthy ways to manage stress, such as: Meditation or deep breathing. Spending time in nature. Journaling. Return to your normal activities as told by your health care provider. Ask your health care provider what activities are safe for you. Alcohol and drug use If you drink alcohol: Limit how much you use to: 0-1 drink a day for women who are not pregnant. 0-2 drinks a day for men. Be aware of how much alcohol is in your drink. In the [place] one drink equals one 12 oz bottle of beer (355 mL), one 5 oz glass of wine (148 mL), or one 1 oz glass of hard liquor (44 mL). Discuss your alcohol use with your health care provider. Alcohol can affect any antidepressant medicines you are taking. Discuss any drug use with your health care provider. General instructions  Take over-the-counter and prescription medicines only as told by your health care provider. Eat a healthy diet and get plenty of sleep. Consider joining a support group. Your health care provider may be able to recommend one. Keep all follow-up visits as told by your health care provider. This is important. Where to find more information National Alliance on Mental Illness: www.nami.org [place] National Institute of Mental Health: www.nimh.nih.gov Contact a health care provider if: Your symptoms get worse. You develop new symptoms. Get help right away if: You self-harm. You have serious thoughts about hurting yourself or others. You hallucinate. If you ever feel like you may hurt yourself or  others, or have thoughts about taking your own life, get help right away. Go to your nearest emergency department or: Call your local emergency services (911 in the [place] Call a suicide crisis helpline, such as the National Suicide Prevention Lifeline at 1-800-273-8255 or 988 in the [place] This is open 24 hours a day in the [place] Text the Crisis Text Line at 741741 (in the [place] Summary Major depressive disorder (MDD) is a mental health condition. MDD causes symptoms of sadness, hopelessness, and loss of interest in things. These symptoms last most of the day, almost every day, for 2 weeks. The symptoms of MDD can interfere with relationships and with everyday activities. Treatments and support are available for people who develop MDD. You may need more than one type of treatment. Get help right away if you have serious thoughts about hurting yourself or others. This information is not intended to replace advice given to you by your health care provider. Make sure you discuss any questions you have with your health care provider. Document Revised: 08/05/2020 Document Reviewed: 12/22/2018 Elsevier Patient Education  2023 Elsevier Inc.  

## 2021-07-03 LAB — CBC WITH DIFFERENTIAL/PLATELET
Basophils Absolute: 0.1 10*3/uL (ref 0.0–0.2)
Basos: 1 %
EOS (ABSOLUTE): 0.1 10*3/uL (ref 0.0–0.4)
Eos: 1 %
Hematocrit: 36.1 % (ref 34.0–46.6)
Hemoglobin: 11.8 g/dL (ref 11.1–15.9)
Immature Grans (Abs): 0 10*3/uL (ref 0.0–0.1)
Immature Granulocytes: 0 %
Lymphocytes Absolute: 1 10*3/uL (ref 0.7–3.1)
Lymphs: 19 %
MCH: 32.7 pg (ref 26.6–33.0)
MCHC: 32.7 g/dL (ref 31.5–35.7)
MCV: 100 fL — ABNORMAL HIGH (ref 79–97)
Monocytes Absolute: 0.6 10*3/uL (ref 0.1–0.9)
Monocytes: 12 %
Neutrophils Absolute: 3.5 10*3/uL (ref 1.4–7.0)
Neutrophils: 67 %
Platelets: 257 10*3/uL (ref 150–450)
RBC: 3.61 x10E6/uL — ABNORMAL LOW (ref 3.77–5.28)
RDW: 12.3 % (ref 11.7–15.4)
WBC: 5.2 10*3/uL (ref 3.4–10.8)

## 2021-07-03 LAB — CMP14+EGFR
ALT: 20 IU/L (ref 0–32)
AST: 16 IU/L (ref 0–40)
Albumin/Globulin Ratio: 1.7 (ref 1.2–2.2)
Albumin: 4.3 g/dL (ref 3.7–4.7)
Alkaline Phosphatase: 61 IU/L (ref 44–121)
BUN/Creatinine Ratio: 15 (ref 12–28)
BUN: 17 mg/dL (ref 8–27)
Bilirubin Total: 0.9 mg/dL (ref 0.0–1.2)
CO2: 27 mmol/L (ref 20–29)
Calcium: 10 mg/dL (ref 8.7–10.3)
Chloride: 101 mmol/L (ref 96–106)
Creatinine, Ser: 1.14 mg/dL — ABNORMAL HIGH (ref 0.57–1.00)
Globulin, Total: 2.6 g/dL (ref 1.5–4.5)
Glucose: 76 mg/dL (ref 70–99)
Potassium: 4.1 mmol/L (ref 3.5–5.2)
Sodium: 140 mmol/L (ref 134–144)
Total Protein: 6.9 g/dL (ref 6.0–8.5)
eGFR: 49 mL/min/{1.73_m2} — ABNORMAL LOW (ref >59)

## 2021-07-04 ENCOUNTER — Other Ambulatory Visit: Payer: Self-pay | Admitting: Family

## 2021-07-04 DIAGNOSIS — I10 Essential (primary) hypertension: Secondary | ICD-10-CM

## 2021-07-08 ENCOUNTER — Ambulatory Visit (INDEPENDENT_AMBULATORY_CARE_PROVIDER_SITE_OTHER): Payer: Medicare HMO | Admitting: Family Medicine

## 2021-07-08 ENCOUNTER — Encounter: Payer: Self-pay | Admitting: Family Medicine

## 2021-07-08 VITALS — BP 134/82 | HR 84 | Temp 97.8°F | Ht 64.0 in | Wt 195.4 lb

## 2021-07-08 DIAGNOSIS — N3090 Cystitis, unspecified without hematuria: Secondary | ICD-10-CM

## 2021-07-08 DIAGNOSIS — N952 Postmenopausal atrophic vaginitis: Secondary | ICD-10-CM | POA: Diagnosis not present

## 2021-07-08 DIAGNOSIS — R300 Dysuria: Secondary | ICD-10-CM | POA: Diagnosis not present

## 2021-07-08 DIAGNOSIS — J309 Allergic rhinitis, unspecified: Secondary | ICD-10-CM

## 2021-07-08 LAB — MICROSCOPIC EXAMINATION
Bacteria, UA: NONE SEEN
RBC, Urine: NONE SEEN /hpf (ref 0–2)
Renal Epithel, UA: NONE SEEN /hpf
WBC, UA: NONE SEEN /hpf (ref 0–5)

## 2021-07-08 LAB — URINALYSIS, ROUTINE W REFLEX MICROSCOPIC
Bilirubin, UA: NEGATIVE
Glucose, UA: NEGATIVE
Leukocytes,UA: NEGATIVE
Nitrite, UA: NEGATIVE
Protein,UA: NEGATIVE
RBC, UA: NEGATIVE
Specific Gravity, UA: 1.015 (ref 1.005–1.030)
Urobilinogen, Ur: 1 mg/dL (ref 0.2–1.0)
pH, UA: 7 (ref 5.0–7.5)

## 2021-07-08 MED ORDER — ESTRADIOL 0.1 MG/GM VA CREA: 1.0000 | TOPICAL_CREAM | VAGINAL | 0 refills | Status: DC

## 2021-07-08 MED ORDER — LEVOCETIRIZINE DIHYDROCHLORIDE 5 MG PO TABS: 2.5000 mg | ORAL_TABLET | Freq: Every evening | ORAL | 1 refills | Status: DC

## 2021-07-08 NOTE — Progress Notes (Signed)
Established Patient Office Visit  Subjective   Patient ID: Alison [name], female    DOB: 27-Feb-1940  Age: 81 y.o. MRN: [id number]  Chief Complaint  Patient presents with   Dysuria    Dysuria  This is a recurrent problem. The current episode started more than 1 year ago. The problem occurs intermittently. The problem has been gradually worsening. The quality of the pain is described as aching and burning. The pain is mild. There has been no fever. She is Not sexually active. There is No history of pyelonephritis. Pertinent negatives include no chills, flank pain, frequency, hematuria, nausea, possible pregnancy, sweats, urgency or vomiting.  At one time she was on a vaginal estrogen cream that was helpful for these symptoms.   She also reports ongoing runny nose. Denies HA, cough, fever, sore throat. She has been taking zyrtec. She has not been using flonase regularly.   Past Medical History:  Diagnosis Date   Anxiety    Arthritis    Breast cancer ([place])    X2   Cervical dysplasia    GERD (gastroesophageal reflux disease)    takes otc meds   Glaucoma    Hyperlipidemia    Hypertension    Lichen sclerosus    Personal history of chemotherapy 2010   Personal history of radiation therapy 1999   Shingles 2012      Review of Systems  Constitutional:  Negative for chills, fever and weight loss.  HENT:  Negative for ear discharge, ear pain, hearing loss and nosebleeds.   Eyes:  Negative for blurred vision, double vision, pain and discharge.  Respiratory:  Negative for cough, sputum production, shortness of breath and wheezing.   Cardiovascular:  Negative for chest pain, claudication and leg swelling.  Gastrointestinal:  Negative for abdominal pain, constipation, diarrhea, heartburn, nausea and vomiting.  Genitourinary:  Positive for dysuria. Negative for flank pain, frequency, hematuria and urgency.  Musculoskeletal:  Positive for back pain (chronic back pain). Negative for joint  pain and neck pain.  Skin:  Negative for itching and rash.  Neurological:  Negative for tremors, sensory change, speech change and headaches.  Endo/Heme/Allergies:  Negative for environmental allergies. Does not bruise/bleed easily.  Psychiatric/Behavioral:  Negative for depression, hallucinations, substance abuse and suicidal ideas.       Objective:     BP 134/82   Pulse 84   Temp 97.8 F (36.6 C) (Temporal)   Ht '5\' 4"'$  (1.626 m)   Wt 88.6 kg   LMP 01/25/2004   SpO2 97%   BMI 33.54 kg/m  BP Readings from Last 3 Encounters:  07/08/21 134/82  07/02/21 127/64  06/27/21 (!) 165/90      Physical Exam Constitutional:      Appearance: Normal appearance. She is obese.  HENT:     Head: Normocephalic and atraumatic.     Right Ear: Tympanic membrane, ear canal and external ear normal.     Left Ear: Tympanic membrane, ear canal and external ear normal.     Nose: Nose normal.     Right Sinus: No maxillary sinus tenderness or frontal sinus tenderness.     Left Sinus: No maxillary sinus tenderness or frontal sinus tenderness.     Mouth/Throat:     Mouth: Mucous membranes are moist.  Eyes:     Extraocular Movements: Extraocular movements intact.     Conjunctiva/sclera: Conjunctivae normal.  Cardiovascular:     Rate and Rhythm: Normal rate and regular rhythm.     Pulses:  Normal pulses.     Heart sounds: Normal heart sounds.  Pulmonary:     Effort: Pulmonary effort is normal.     Breath sounds: Normal breath sounds.  Abdominal:     General: Bowel sounds are normal. There is no distension.     Palpations: Abdomen is soft.     Tenderness: There is no abdominal tenderness. There is no right CVA tenderness, left CVA tenderness, guarding or rebound.  Musculoskeletal:        General: Normal range of motion.     Cervical back: Normal range of motion and neck supple.  Skin:    General: Skin is warm and dry.     Capillary Refill: Capillary refill takes less than 2 seconds.   Neurological:     Mental Status: She is alert and oriented to person, place, and time.  Psychiatric:        Mood and Affect: Mood normal.        Behavior: Behavior normal.        Thought Content: Thought content normal.        Judgment: Judgment normal.      No results found for any visits on 07/08/21.    The ASCVD Risk score (Arnett DK, et al., 2019) failed to calculate for the following reasons:   The 2019 ASCVD risk score is only valid for ages 52 to 21    Assessment & Plan:   Alison [name] was seen today for dysuria.  Diagnoses and all orders for this visit:  Cystitis Negative UA. Previously treated well with estrogen vaginal, will restart.  -     Urinalysis, Routine w reflex microscopic -     estradiol (ESTRACE VAGINAL) 0.1 MG/GM vaginal cream; Place 1 Applicatorful vaginally 2 (two) times a week.  Atrophic vaginitis Start as below. After 2 weeks, try 1/2 applicatorful 2x a week.  -     estradiol (ESTRACE VAGINAL) 0.1 MG/GM vaginal cream; Place 1 Applicatorful vaginally 2 (two) times a week.  Chronic allergic rhinitis Not well controlled. Switch from zyrtec to xyzal. Continue flonase daily.  -     levocetirizine (XYZAL) 5 MG tablet; Take 0.5 tablets (2.5 mg total) by mouth every evening.   Kathlen Mody, RN  I personally was present during the history, physical exam, and medical decision-making activities of this service and have verified that the service and findings are accurately documented in the nurse practitioner student's note.  Marjorie Smolder, FNP

## 2021-07-08 NOTE — Patient Instructions (Signed)
Atrophic Vaginitis  Atrophic vaginitis is a condition in which the tissues that line the vagina become dry and thin. This condition is most common in women who have stopped having regular menstrual periods (are in menopause). This usually starts when a woman is 80 to 80 years old. That is the time when a woman's estrogen levels begin to decrease. Estrogen is a female hormone. It helps to keep the tissues of the vagina moist. It stimulates the vagina to produce a clear fluid that lubricates the vagina for sex. This fluid also protects the vagina from infection. Lack of estrogen can cause the lining of the vagina to get thinner and dryer. The vagina may also shrink in size. It may become less elastic. Atrophic vaginitis tends to get worse over time as a woman's estrogen level drops. What are the causes? This condition is caused by the normal drop in estrogen that happens around the time of menopause. What increases the risk? Certain conditions or situations may lower a woman's estrogen level, leading to a higher risk for atrophic vaginitis. You are more likely to develop this condition if: You are taking medicines that block estrogen. You have had your ovaries removed. You are being treated for cancer with radiation or medicines (chemotherapy). You have given birth or are breastfeeding. You are older than age 80. You smoke. What are the signs or symptoms? Symptoms of this condition include: Pain, soreness, a feeling of pressure, or bleeding during sex (dyspareunia). Vaginal burning, irritation, or itching. Pain or bleeding when a speculum is used in a vaginal exam. Having burning pain while urinating. Vaginal discharge. In some cases, there are no symptoms. How is this diagnosed? This condition is diagnosed based on your medical history and a physical exam. This will include a pelvic exam that checks the vaginal tissues. Though rare, you may also have other tests, including: A urine test. A  test that checks the acid balance in your vagina (acid balance test). How is this treated? Treatment for this condition depends on how severe your symptoms are. Treatment may include: Using an over-the-counter vaginal lubricant before sex. Using a long-acting vaginal moisturizer. Using low-dose estrogen for moderate to severe symptoms that do not respond to other treatments. Options include creams, tablets, and inserts (vaginal rings). Before you use a vaginal estrogen, tell your health care provider if you have a history of: Breast cancer. Endometrial cancer. Blood clots. If you are not sexually active and your symptoms are very mild, you may not need treatment. Follow these instructions at home: Medicines Take over-the-counter and prescription medicines only as told by your health care provider. Do not use herbal or alternative medicines unless your health care provider says that you can. Use over-the-counter creams, lubricants, or moisturizers for dryness only as told by your health care provider. General instructions If your atrophic vaginitis is caused by menopause, discuss all of your menopause symptoms and treatment options with your health care provider. Do not douche. Do not use products that can make your vagina dry. These include: Scented feminine sprays. Scented tampons. Scented soaps. Vaginal sex can help to improve blood flow and elasticity of vaginal tissue. If you choose to have sex and it hurts, try using a water-soluble lubricant or moisturizer right before having sex. Contact a health care provider if: Your discharge looks different than normal. Your vagina has an unusual smell. You have new symptoms. Your symptoms do not improve with treatment. Your symptoms get worse. Summary Atrophic vaginitis is a condition   in which the tissues that line the vagina become dry and thin. It is most common in women who have stopped having regular menstrual periods (are in  menopause). Treatment options include using vaginal lubricants and low-dose vaginal estrogen. Contact a health care provider if your vagina has an unusual smell, or if your symptoms get worse or do not improve after treatment. This information is not intended to replace advice given to you by your health care provider. Make sure you discuss any questions you have with your health care provider. Document Revised: 07/11/2019 Document Reviewed: 07/11/2019 Elsevier Patient Education  2023 Elsevier Inc.  

## 2021-07-16 ENCOUNTER — Ambulatory Visit: Payer: Medicare HMO

## 2021-07-16 ENCOUNTER — Ambulatory Visit (INDEPENDENT_AMBULATORY_CARE_PROVIDER_SITE_OTHER): Payer: Medicare HMO | Admitting: Nurse Practitioner

## 2021-07-16 ENCOUNTER — Other Ambulatory Visit: Payer: Self-pay | Admitting: Nurse Practitioner

## 2021-07-16 ENCOUNTER — Ambulatory Visit (INDEPENDENT_AMBULATORY_CARE_PROVIDER_SITE_OTHER): Payer: Medicare HMO

## 2021-07-16 ENCOUNTER — Encounter: Payer: Self-pay | Admitting: Nurse Practitioner

## 2021-07-16 VITALS — BP 178/91 | HR 89 | Ht 64.0 in | Wt 194.8 lb

## 2021-07-16 DIAGNOSIS — R109 Unspecified abdominal pain: Secondary | ICD-10-CM

## 2021-07-16 DIAGNOSIS — N200 Calculus of kidney: Secondary | ICD-10-CM

## 2021-07-16 LAB — URINALYSIS, ROUTINE W REFLEX MICROSCOPIC
Bilirubin, UA: NEGATIVE
Glucose, UA: NEGATIVE
Ketones, UA: NEGATIVE
Leukocytes,UA: NEGATIVE
Nitrite, UA: NEGATIVE
Protein,UA: NEGATIVE
RBC, UA: NEGATIVE
Specific Gravity, UA: 1.015 (ref 1.005–1.030)
Urobilinogen, Ur: 0.2 mg/dL (ref 0.2–1.0)
pH, UA: 8 — ABNORMAL HIGH (ref 5.0–7.5)

## 2021-07-16 MED ORDER — TAMSULOSIN HCL 0.4 MG PO CAPS: 0.4000 mg | ORAL_CAPSULE | Freq: Every day | ORAL | 3 refills | Status: DC

## 2021-07-20 ENCOUNTER — Telehealth: Payer: Self-pay | Admitting: Family

## 2021-07-20 NOTE — Telephone Encounter (Signed)
No, this was prescribed for possible kidney stones. Is she still having flank pain? Once she passes stone or pain resolves she can stop medication.

## 2021-07-23 ENCOUNTER — Ambulatory Visit
Admission: RE | Admit: 2021-07-23 | Discharge: 2021-07-23 | Disposition: A | Discharge status: Home / Self Care | Payer: Medicare HMO | Source: Ambulatory Visit | Attending: Hematology | Admitting: Hematology

## 2021-07-23 DIAGNOSIS — Z1231 Encounter for screening mammogram for malignant neoplasm of breast: Secondary | ICD-10-CM | POA: Diagnosis not present

## 2021-07-23 DIAGNOSIS — C50411 Malignant neoplasm of upper-outer quadrant of right female breast: Secondary | ICD-10-CM

## 2021-07-28 ENCOUNTER — Ambulatory Visit (HOSPITAL_COMMUNITY)
Admission: RE | Admit: 2021-07-28 | Discharge: 2021-07-28 | Disposition: A | Discharge status: Home / Self Care | Payer: Medicare HMO | Source: Ambulatory Visit | Attending: Nurse Practitioner | Admitting: Nurse Practitioner

## 2021-07-28 DIAGNOSIS — R109 Unspecified abdominal pain: Secondary | ICD-10-CM | POA: Diagnosis not present

## 2021-09-02 ENCOUNTER — Ambulatory Visit: Payer: Medicare HMO | Admitting: Family

## 2021-09-07 ENCOUNTER — Encounter: Payer: Self-pay | Admitting: Family

## 2021-09-07 ENCOUNTER — Ambulatory Visit (INDEPENDENT_AMBULATORY_CARE_PROVIDER_SITE_OTHER): Payer: Medicare HMO | Admitting: Family

## 2021-09-07 VITALS — BP 130/64 | HR 76 | Temp 97.2°F | Ht 64.0 in | Wt 194.6 lb

## 2021-09-07 DIAGNOSIS — M5416 Radiculopathy, lumbar region: Secondary | ICD-10-CM

## 2021-09-07 DIAGNOSIS — K5900 Constipation, unspecified: Secondary | ICD-10-CM | POA: Diagnosis not present

## 2021-09-07 DIAGNOSIS — M170 Bilateral primary osteoarthritis of knee: Secondary | ICD-10-CM

## 2021-09-07 DIAGNOSIS — F419 Anxiety disorder, unspecified: Secondary | ICD-10-CM | POA: Diagnosis not present

## 2021-09-07 DIAGNOSIS — R69 Illness, unspecified: Secondary | ICD-10-CM | POA: Diagnosis not present

## 2021-09-07 DIAGNOSIS — I10 Essential (primary) hypertension: Secondary | ICD-10-CM | POA: Diagnosis not present

## 2021-09-07 DIAGNOSIS — E782 Mixed hyperlipidemia: Secondary | ICD-10-CM | POA: Diagnosis not present

## 2021-09-07 DIAGNOSIS — F320 Major depressive disorder, single episode, mild: Secondary | ICD-10-CM | POA: Diagnosis not present

## 2021-09-07 DIAGNOSIS — Z853 Personal history of malignant neoplasm of breast: Secondary | ICD-10-CM

## 2021-09-07 MED ORDER — ESCITALOPRAM OXALATE 10 MG PO TABS: 10.0000 mg | ORAL_TABLET | Freq: Every day | ORAL | 1 refills | Status: DC

## 2021-09-07 NOTE — Patient Instructions (Signed)
Health Maintenance After Age 80 After age 80, you are at a higher risk for certain long-term diseases and infections as well as injuries from falls. Falls are a major cause of broken bones and head injuries in people who are older than age 80. Getting regular preventive care can help to keep you healthy and well. Preventive care includes getting regular testing and making lifestyle changes as recommended by your health care provider. Talk with your health care provider about: Which screenings and tests you should have. A screening is a test that checks for a disease when you have no symptoms. A diet and exercise plan that is right for you. What should I know about screenings and tests to prevent falls? Screening and testing are the best ways to find a health problem early. Early diagnosis and treatment give you the best chance of managing medical conditions that are common after age 80. Certain conditions and lifestyle choices may make you more likely to have a fall. Your health care provider may recommend: Regular vision checks. Poor vision and conditions such as cataracts can make you more likely to have a fall. If you wear glasses, make sure to get your prescription updated if your vision changes. Medicine review. Work with your health care provider to regularly review all of the medicines you are taking, including over-the-counter medicines. Ask your health care provider about any side effects that may make you more likely to have a fall. Tell your health care provider if any medicines that you take make you feel dizzy or sleepy. Strength and balance checks. Your health care provider may recommend certain tests to check your strength and balance while standing, walking, or changing positions. Foot health exam. Foot pain and numbness, as well as not wearing proper footwear, can make you more likely to have a fall. Screenings, including: Osteoporosis screening. Osteoporosis is a condition that causes  the bones to get weaker and break more easily. Blood pressure screening. Blood pressure changes and medicines to control blood pressure can make you feel dizzy. Depression screening. You may be more likely to have a fall if you have a fear of falling, feel depressed, or feel unable to do activities that you used to do. Alcohol use screening. Using too much alcohol can affect your balance and may make you more likely to have a fall. Follow these instructions at home: Lifestyle Do not drink alcohol if: Your health care provider tells you not to drink. If you drink alcohol: Limit how much you have to: 0-1 drink a day for women. 0-2 drinks a day for men. Know how much alcohol is in your drink. In the [place] one drink equals one 12 oz bottle of beer (355 mL), one 5 oz glass of wine (148 mL), or one 1 oz glass of hard liquor (44 mL). Do not use any products that contain nicotine or tobacco. These products include cigarettes, chewing tobacco, and vaping devices, such as e-cigarettes. If you need help quitting, ask your health care provider. Activity  Follow a regular exercise program to stay fit. This will help you maintain your balance. Ask your health care provider what types of exercise are appropriate for you. If you need a cane or walker, use it as recommended by your health care provider. Wear supportive shoes that have nonskid soles. Safety  Remove any tripping hazards, such as rugs, cords, and clutter. Install safety equipment such as grab bars in bathrooms and safety rails on stairs. Keep rooms and walkways   well-lit. General instructions Talk with your health care provider about your risks for falling. Tell your health care provider if: You fall. Be sure to tell your health care provider about all falls, even ones that seem minor. You feel dizzy, tiredness (fatigue), or off-balance. Take over-the-counter and prescription medicines only as told by your health care provider. These include  supplements. Eat a healthy diet and maintain a healthy weight. A healthy diet includes low-fat dairy products, low-fat (lean) meats, and fiber from whole grains, beans, and lots of fruits and vegetables. Stay current with your vaccines. Schedule regular health, dental, and eye exams. Summary Having a healthy lifestyle and getting preventive care can help to protect your health and wellness after age 80. Screening and testing are the best way to find a health problem early and help you avoid having a fall. Early diagnosis and treatment give you the best chance for managing medical conditions that are more common for people who are older than age 80. Falls are a major cause of broken bones and head injuries in people who are older than age 80. Take precautions to prevent a fall at home. Work with your health care provider to learn what changes you can make to improve your health and wellness and to prevent falls. This information is not intended to replace advice given to you by your health care provider. Make sure you discuss any questions you have with your health care provider. Document Revised: 06/01/2020 Document Reviewed: 06/01/2020 Elsevier Patient Education  2023 [employer] Inc.  

## 2021-09-07 NOTE — Progress Notes (Signed)
Subjective:    Patient ID: Alison [name], female    DOB: 02/05/1940, 81 y.o.   MRN: [id number]  Chief Complaint  Patient presents with   Medical Management of Chronic Issues    WANTS TO UP LEXAPRO BACK TO 10 MG AND CHANGE MOBIC BECAUSE ITS NOT HELPING    PT presents to the office today for chronic follow up. She is followed by Oncologists for Left breast cancer in 1998 and right breast cancer 2010.  Hypertension This is a chronic problem. The current episode started more than 1 year ago. The problem has been resolved since onset. The problem is controlled. Associated symptoms include anxiety and peripheral edema. Pertinent negatives include no chest pain, malaise/fatigue or shortness of breath. Risk factors for coronary artery disease include obesity, dyslipidemia and sedentary lifestyle. The current treatment provides moderate improvement.  Back Pain This is a chronic problem. The current episode started more than 1 year ago. The problem occurs intermittently. The problem has been waxing and waning since onset. The pain is present in the lumbar spine. The quality of the pain is described as aching. The pain is at a severity of 2/10. The pain is moderate. Pertinent negatives include no chest pain. The treatment provided moderate relief.  Arthritis Presents for follow-up visit. She complains of pain and stiffness. Affected locations include the left knee, right knee, left hip and right hip. Her pain is at a severity of 8/10. Pertinent negatives include no fatigue.  Hyperlipidemia This is a chronic problem. The current episode started more than 1 year ago. The problem is controlled. Recent lipid tests were reviewed and are normal. Exacerbating diseases include obesity. Pertinent negatives include no chest pain or shortness of breath. Current antihyperlipidemic treatment includes statins. The current treatment provides moderate improvement of lipids. Risk factors for coronary artery disease include  dyslipidemia, hypertension, a sedentary lifestyle and post-menopausal.  Depression        This is a chronic problem.  The current episode started more than 1 year ago.   The onset quality is gradual.   The problem occurs intermittently.  Associated symptoms include no fatigue, no helplessness, no hopelessness and not sad.  Past treatments include SSRIs - Selective serotonin reuptake inhibitors.  Past medical history includes anxiety.   Anxiety Presents for follow-up visit. Symptoms include excessive worry, irritability and nervous/anxious behavior. Patient reports no chest pain or shortness of breath. Symptoms occur occasionally. The severity of symptoms is moderate.    Constipation This is a chronic problem. The current episode started more than 1 year ago. The problem has been waxing and waning since onset. Her stool frequency is 2 to 3 times per week. Associated symptoms include back pain. She has tried laxatives and stool softeners for the symptoms. The treatment provided moderate relief.      Review of Systems  Constitutional:  Positive for irritability. Negative for fatigue and malaise/fatigue.  Respiratory:  Negative for shortness of breath.   Cardiovascular:  Negative for chest pain.  Gastrointestinal:  Positive for constipation.  Musculoskeletal:  Positive for arthritis, back pain and stiffness.  Psychiatric/Behavioral:  Positive for depression. The patient is nervous/anxious.   All other systems reviewed and are negative.      Objective:   Physical Exam Vitals reviewed.  Constitutional:      General: She is not in acute distress.    Appearance: She is well-developed. She is obese.  HENT:     Head: Normocephalic and atraumatic.  Right Ear: Tympanic membrane normal.     Left Ear: Tympanic membrane normal.  Eyes:     Pupils: Pupils are equal, round, and reactive to light.  Neck:     Thyroid: No thyromegaly.  Cardiovascular:     Rate and Rhythm: Normal rate and regular  rhythm.     Heart sounds: Normal heart sounds. No murmur heard. Pulmonary:     Effort: Pulmonary effort is normal. No respiratory distress.     Breath sounds: Normal breath sounds. No wheezing.  Abdominal:     General: Bowel sounds are normal. There is no distension.     Palpations: Abdomen is soft.     Tenderness: There is no abdominal tenderness.  Musculoskeletal:        General: No tenderness. Normal range of motion.     Cervical back: Normal range of motion and neck supple.     Right lower leg: Edema (trace) present.     Left lower leg: Edema (trace) present.  Skin:    General: Skin is warm and dry.  Neurological:     Mental Status: She is alert and oriented to person, place, and time.     Cranial Nerves: No cranial nerve deficit.     Deep Tendon Reflexes: Reflexes are normal and symmetric.  Psychiatric:        Behavior: Behavior normal.        Thought Content: Thought content normal.        Judgment: Judgment normal.      BP 130/64   Pulse 76   Temp (!) 97.2 F (36.2 C)   Ht _0  (1.626 m)   Wt 194 lb 9.6 oz (88.3 kg)   LMP 01/25/2004   SpO2 99%   BMI 33.40 kg/m       Assessment & Plan:  Alison [name] comes in today with chief complaint of Medical Management of Chronic Issues (WANTS TO UP LEXAPRO BACK TO 10 MG AND CHANGE MOBIC BECAUSE ITS NOT HELPING )   Diagnosis and orders addressed:  1. Hypertension with goal blood pressure less than 130/80 - CMP14+EGFR  2. Primary osteoarthritis of both knees - CMP14+EGFR  3. Anxiety Will increase Lexapro to 10 mg, pt has already increased her 5 mg by taking two tablets and states this is working well.  - escitalopram (LEXAPRO) 10 MG tablet; Take 1 tablet (10 mg total) by mouth daily.  Dispense: 90 tablet; Refill: 1 - CMP14+EGFR  4. Constipation, unspecified constipation type - CMP14+EGFR  5. Depression, major, single episode, mild (HCC) - escitalopram (LEXAPRO) 10 MG tablet; Take 1 tablet (10 mg total) by  mouth daily.  Dispense: 90 tablet; Refill: 1 - CMP14+EGFR  6. HX: breast cancer - CMP14+EGFR  7. Moderate mixed hyperlipidemia not requiring statin therapy - CMP14+EGFR  8. Left lumbar radiculopathy - CMP14+EGFR   Labs pending Health Maintenance reviewed Diet and exercise encouraged  Follow up plan: 6 months    Evelina Dun, FNP

## 2021-09-08 LAB — CMP14+EGFR
ALT: 16 IU/L (ref 0–32)
AST: 21 IU/L (ref 0–40)
Albumin/Globulin Ratio: 1.5 (ref 1.2–2.2)
Albumin: 4.1 g/dL (ref 3.8–4.8)
Alkaline Phosphatase: 65 IU/L (ref 44–121)
BUN/Creatinine Ratio: 13 (ref 12–28)
BUN: 14 mg/dL (ref 8–27)
Bilirubin Total: 0.9 mg/dL (ref 0.0–1.2)
CO2: 26 mmol/L (ref 20–29)
Calcium: 9.5 mg/dL (ref 8.7–10.3)
Chloride: 102 mmol/L (ref 96–106)
Creatinine, Ser: 1.04 mg/dL — ABNORMAL HIGH (ref 0.57–1.00)
Globulin, Total: 2.7 g/dL (ref 1.5–4.5)
Glucose: 84 mg/dL (ref 70–99)
Potassium: 4 mmol/L (ref 3.5–5.2)
Sodium: 141 mmol/L (ref 134–144)
Total Protein: 6.8 g/dL (ref 6.0–8.5)
eGFR: 54 mL/min/{1.73_m2} — ABNORMAL LOW (ref >59)

## 2021-09-13 ENCOUNTER — Other Ambulatory Visit: Payer: Self-pay | Admitting: Family

## 2021-09-19 ENCOUNTER — Other Ambulatory Visit: Payer: Self-pay | Admitting: Family

## 2021-09-19 DIAGNOSIS — I10 Essential (primary) hypertension: Secondary | ICD-10-CM

## 2021-09-21 ENCOUNTER — Ambulatory Visit (INDEPENDENT_AMBULATORY_CARE_PROVIDER_SITE_OTHER): Payer: Medicare HMO

## 2021-09-21 ENCOUNTER — Other Ambulatory Visit: Payer: Self-pay

## 2021-09-21 DIAGNOSIS — I10 Essential (primary) hypertension: Secondary | ICD-10-CM

## 2021-09-21 DIAGNOSIS — Z0000 Encounter for general adult medical examination without abnormal findings: Secondary | ICD-10-CM

## 2021-09-21 MED ORDER — METOPROLOL SUCCINATE ER 25 MG PO TB24: 12.5000 mg | ORAL_TABLET | Freq: Two times a day (BID) | ORAL | 1 refills | Status: DC

## 2021-09-21 NOTE — Progress Notes (Signed)
MEDICARE ANNUAL WELLNESS VISIT  09/21/2021  Telephone Visit Disclaimer This Medicare AWV was conducted by telephone due to national recommendations for restrictions regarding the COVID-19 Pandemic (e.g. social distancing).  I verified, using two identifiers, that I am speaking with Alison [name] or their authorized healthcare agent. I discussed the limitations, risks, security, and privacy concerns of performing an evaluation and management service by telephone and the potential availability of an in-person appointment in the future. The patient expressed understanding and agreed to proceed.  Location of Patient: Home Location of Provider (nurse):  [facility]  Subjective:    Alison [name] is a 81 y.o. female patient of Hawks, Theador Hawthorne, FNP who had a Medicare Annual Wellness Visit today via telephone. Alison [name] is Retired and lives alone. She has three children, four grandchildren, and two great grandchildren. She reports that she is socially active and does interact with friends/family regularly. She is minimally physically active and enjoys being a part of the [employer] and spending time with family and friends.  Patient Care Team: Sharion Balloon, FNP as PCP - General (Family Medicine) Gordy Levan, MD (Hematology and Oncology)     09/21/2021   11:13 AM 09/18/2020    1:06 PM 10/03/2018   10:32 AM 12/30/2016   10:11 AM 01/01/2016    9:35 AM 07/09/2015    2:52 PM 01/01/2015    9:45 AM  Advanced Directives  Does Patient Have a Medical Advance Directive? No No No No No No No  Would patient like information on creating a medical advance directive? No - Patient declined No - Patient declined No - Patient declined   No - patient declined information     Hospital Utilization Over the Past 12 Months: # of hospitalizations or ER visits: 0 # of surgeries: 0  Review of Systems    Patient reports that her overall health is unchanged compared to last year.  History obtained from chart  review and the patient  Patient Reported Readings (BP, Pulse, CBG, Weight, etc) none  Pain Assessment Pain : 0-10 Pain Score: 5  Pain Type: Chronic pain Pain Location: Knee Pain Orientation: Right, Left Pain Descriptors / Indicators: Aching, Constant Pain Onset: More than a month ago Pain Frequency: Constant     Current Medications & Allergies (verified) Allergies as of 09/21/2021   No Known Allergies      Medication List        Accurate as of September 21, 2021 11:17 AM. If you have any questions, ask your nurse or doctor.          aspirin 81 MG tablet Take 81 mg by mouth daily.   Brinzolamide-Brimonidine 1-0.2 % Susp Apply to eye.   CALCIUM + D PO Take 2 capsules by mouth daily.   escitalopram 10 MG tablet Commonly known as: Lexapro Take 1 tablet (10 mg total) by mouth daily.   estradiol 0.1 MG/GM vaginal cream Commonly known as: ESTRACE VAGINAL Place 1 Applicatorful vaginally 2 (two) times a week.   FISH OIL PO Take 1 capsule by mouth daily.   fluticasone 50 MCG/ACT nasal spray Commonly known as: FLONASE SPRAY 2 SPRAYS INTO EACH NOSTRIL EVERY DAY   Klor-Con M20 20 MEQ tablet Generic drug: potassium chloride SA TAKE 1 TABLET BY MOUTH TWICE A DAY   latanoprost 0.005 % ophthalmic solution Commonly known as: XALATAN 1 drop at bedtime.   levocetirizine 5 MG tablet Commonly known as: XYZAL Take 0.5 tablets (2.5 mg  total) by mouth every evening.   losartan-hydrochlorothiazide 100-25 MG tablet Commonly known as: HYZAAR TAKE 1 TABLET BY MOUTH EVERY DAY   metoprolol succinate 25 MG 24 hr tablet Commonly known as: TOPROL-XL TAKE 0.5 TABLETS (12.5 MG TOTAL) BY MOUTH IN THE MORNING AND AT BEDTIME.   rosuvastatin 10 MG tablet Commonly known as: CRESTOR TAKE 1 TABLET BY MOUTH EVERY DAY   tamsulosin 0.4 MG Caps capsule Commonly known as: FLOMAX Take 1 capsule (0.4 mg total) by mouth daily.        History (reviewed): Past Medical History:   Diagnosis Date   Anxiety    Arthritis    Breast cancer ([place])    X2   Cervical dysplasia    GERD (gastroesophageal reflux disease)    takes otc meds   Glaucoma    Hyperlipidemia    Hypertension    Lichen sclerosus    Personal history of chemotherapy 2010   Personal history of radiation therapy 1999   Shingles 2012   Past Surgical History:  Procedure Laterality Date   BREAST BIOPSY Right 2010   BREAST LUMPECTOMY Right 2010   BREAST LUMPECTOMY Left    BREAST SURGERY     Lumpectomy right and left   GYNECOLOGIC CRYOSURGERY     HYSTEROSCOPY     PORT-A-CATH REMOVAL Left 01/31/2014   Procedure: REMOVAL PORT-A-CATH;  Surgeon: Autumn Messing III, MD;  Location: [facility];  Service: General;  Laterality: Left;   TUBAL LIGATION     Family History  Problem Relation Age of Onset   Hypertension Mother    Cancer Mother        colon   Colon cancer Mother    Diabetes Sister    Heart disease Father    Breast cancer Maternal Aunt 38   Breast cancer Maternal Aunt    Social History   Socioeconomic History   Marital status: Divorced    Spouse name: Not on file   Number of children: 3   Years of education: 10   Highest education level: 10th grade  Occupational History   Occupation: Retired  Tobacco Use   Smoking status: Never   Smokeless tobacco: Never  Scientific laboratory technician Use: Never used  Substance and Sexual Activity   Alcohol use: Yes    Comment: Rare   Drug use: No   Sexual activity: Not Currently    Birth control/protection: Post-menopausal    Comment: 1st intercourse 81 yo-Fewer than 5 partners  Other Topics Concern   Not on file  Social History Narrative   Lives alone. Children live in [place], [place] and [place].   Social Determinants of Health   Financial Resource Strain: Low Risk  (09/18/2020)   Overall Financial Resource Strain (CARDIA)    Difficulty of Paying Living Expenses: Not hard at all  Food Insecurity: No Food Insecurity (09/18/2020)   Hunger Vital Sign     Worried About Running Out of Food in the Last Year: Never true    Ran Out of Food in the Last Year: Never true  Transportation Needs: No Transportation Needs (09/18/2020)   PRAPARE - Hydrologist (Medical): No    Lack of Transportation (Non-Medical): No  Physical Activity: Inactive (09/18/2020)   Exercise Vital Sign    Days of Exercise per Week: 0 days    Minutes of Exercise per Session: 0 min  Stress: No Stress Concern Present (09/18/2020)   [place]  Questionnaire    Feeling of Stress : Only a little  Social Connections: Moderately Integrated (09/18/2020)   Social Connection and Isolation Panel [NHANES]    Frequency of Communication with Friends and Family: More than three times a week    Frequency of Social Gatherings with Friends and Family: More than three times a week    Attends Religious Services: More than 4 times per year    Active Member of [employer] or Organizations: Yes    Attends Archivist Meetings: More than 4 times per year    Marital Status: Divorced    Activities of Daily Living    09/21/2021   11:13 AM  In your present state of health, do you have any difficulty performing the following activities:  Hearing? 0  Vision? 1  Difficulty concentrating or making decisions? 0  Walking or climbing stairs? 1  Dressing or bathing? 0  Doing errands, shopping? 1  Preparing Food and eating ? N  Using the Toilet? N  In the past six months, have you accidently leaked urine? N  Do you have problems with loss of bowel control? N  Managing your Medications? N  Managing your Finances? N  Housekeeping or managing your Housekeeping? N   Patient does have vision problems because of glaucoma.  She also reports bilateral knee pain and plans to see her orthopedic doctor to discuss this.  She does not drive any longer so she has family members who take her to church, appointments, shopping, and  anything she wants to or needs to do.  She has a very close family and is thankful for that.  Patient Education/ Literacy How often do you need to have someone help you when you read instructions, pamphlets, or other written materials from your doctor or pharmacy?: 1 - Never  Exercise Current Exercise Habits: The patient does not participate in regular exercise at present, Exercise limited by: orthopedic condition(s)  Diet Patient reports consuming 2 meals a day and 1 snack(s) a day Patient reports that her primary diet is: Regular Patient reports that she does have regular access to food.   Depression Screen    09/21/2021   11:17 AM 07/08/2021   10:03 AM 07/02/2021   11:30 AM 03/30/2021   10:54 AM 09/18/2020    1:04 PM 09/15/2020   12:00 PM 03/16/2020   11:14 AM  PHQ 2/9 Scores  PHQ - 2 Score 0 1 4 0 0 1 2  PHQ- 9 Score  '3 5 2 2 2 5     '$ Fall Risk    09/21/2021   11:17 AM 09/07/2021   11:54 AM 07/08/2021   10:03 AM 07/02/2021   11:29 AM 03/30/2021   10:53 AM  Fall Risk   Falls in the past year? 0 0 0 0 0  Number falls in past yr:    0   Injury with Fall?    0   Follow up Falls evaluation completed   Falls evaluation completed      Objective:  Alison [name] seemed alert and oriented and she participated appropriately during our telephone visit.  Blood Pressure Weight BMI  BP Readings from Last 3 Encounters:  09/07/21 130/64  07/16/21 (!) 178/91  07/08/21 134/82   Wt Readings from Last 3 Encounters:  09/07/21 194 lb 9.6 oz (88.3 kg)  07/16/21 194 lb 12.8 oz (88.4 kg)  07/08/21 195 lb 6 oz (88.6 kg)   BMI Readings from Last 1 Encounters:  09/07/21 33.40  kg/m    *Unable to obtain current vital signs, weight, and BMI due to telephone visit type  Hearing/Vision  Alison [name] did not seem to have difficulty with hearing/understanding during the telephone conversation Reports that she has had a formal eye exam by an eye care professional within the past year Reports that she has not  had a formal hearing evaluation within the past year *Unable to fully assess hearing and vision during telephone visit type  Cognitive Function:    09/21/2021   11:14 AM 10/03/2018   10:37 AM  6CIT Screen  What Year? 0 points 0 points  What month? 0 points 0 points  What time? 0 points 0 points  Count back from 20 2 points 0 points  Months in reverse 0 points 0 points  Repeat phrase 2 points 0 points  Total Score 4 points 0 points   (Normal:0-7, Significant for Dysfunction: >8)  Normal Cognitive Function Screening: Yes   Immunization & Health Maintenance Record Immunization History  Administered Date(s) Administered   Fluad Quad(high Dose 65+) 11/12/2018, 11/21/2019   Influenza Split 01/20/2018   Influenza, High Dose Seasonal PF 01/07/2016   Influenza, Seasonal, Injecte, Preservative Fre 12/03/2012, 01/01/2015   Influenza,inj,Quad PF,6+ Mos 12/03/2012, 01/01/2015   Influenza,trivalent, recombinat, inj, PF 11/11/2013, 10/25/2016, 01/20/2018   Influenza-Unspecified 12/03/2012, 11/11/2013, 01/01/2015, 10/25/2016, 11/24/2020   Moderna Sars-Covid-2 Vaccination 02/04/2019, 03/07/2019, 11/28/2019   Pneumococcal Conjugate-13 02/17/2016   Pneumococcal Polysaccharide-23 05/31/2017   Tdap 05/21/2019   Zoster Recombinat (Shingrix) 03/30/2021, 07/02/2021    Health Maintenance  Topic Date Due   COVID-19 Vaccine (4 - Moderna risk series) 01/23/2020   INFLUENZA VACCINE  04/24/2022 (Originally 08/24/2021)   TETANUS/TDAP  05/20/2029   Pneumonia Vaccine 98+ Years old  Completed   DEXA SCAN  Completed   Zoster Vaccines- Shingrix  Completed   HPV VACCINES  Aged Out   COLONOSCOPY (Pts 45-34yr Insurance coverage will need to be confirmed)  Discontinued       Assessment  This is a routine wellness examination for [employer]  Health Maintenance: Due or Overdue Health Maintenance Due  Topic Date Due   COVID-19 Vaccine (4 - Moderna risk series) 01/23/2020    BDalbert Garnetdoes not  need a referral for Community Assistance: Care Management:   no Social Work:    no Prescription Assistance:  no Nutrition/Diabetes Education:  no   Plan:  Personalized Goals  Goals Addressed             This Visit's Progress    Patient Stated       09/21/2021 AWV Goal: Fall Prevention  Over the next year, patient will decrease their risk for falls by: Using assistive devices, such as a cane or walker, as needed Identifying fall risks within their home and correcting them by: Removing throw rugs Adding handrails to stairs or ramps Removing clutter and keeping a clear pathway throughout the home Increasing light, especially at night Adding shower handles/bars Raising toilet seat Identifying potential personal risk factors for falls: Medication side effects Incontinence/urgency Vestibular dysfunction Hearing loss Musculoskeletal disorders Neurological disorders Orthostatic hypotension           Lung Cancer Screening Recommended: no (Low Dose CT Chest recommended if Age 10880-80years, 30 pack-year currently smoking OR have quit w/in past 15 years) Hepatitis C Screening recommended: no HIV Screening recommended: no  Advanced Directives: Written information was not prepared per patient's request.  Referrals & Orders No orders of the defined types were placed  in this encounter.   Follow-up Plan Follow-up with Sharion Balloon, FNP as planned    I have personally reviewed and noted the following in the patient's chart:   Medical and social history Use of alcohol, tobacco or illicit drugs  Current medications and supplements Functional ability and status Nutritional status Physical activity Advanced directives List of other physicians Hospitalizations, surgeries, and ER visits in previous 12 months Vitals Screenings to include cognitive, depression, and falls Referrals and appointments  In addition, I have reviewed and discussed with Alison [name] certain  preventive protocols, quality metrics, and best practice recommendations. A written personalized care plan for preventive services as well as general preventive health recommendations is available and can be mailed to the patient at her request.      Burnadette Pop  09/21/2021   Patient declined after visit summary

## 2021-10-04 ENCOUNTER — Ambulatory Visit: Payer: Medicare HMO | Admitting: Family

## 2021-10-14 ENCOUNTER — Other Ambulatory Visit: Payer: Self-pay | Admitting: Nurse Practitioner

## 2021-10-14 DIAGNOSIS — N200 Calculus of kidney: Secondary | ICD-10-CM

## 2021-10-16 ENCOUNTER — Other Ambulatory Visit: Payer: Self-pay | Admitting: Family

## 2021-10-16 DIAGNOSIS — M170 Bilateral primary osteoarthritis of knee: Secondary | ICD-10-CM

## 2021-10-21 ENCOUNTER — Telehealth: Payer: Self-pay | Admitting: Family

## 2021-10-21 DIAGNOSIS — M170 Bilateral primary osteoarthritis of knee: Secondary | ICD-10-CM

## 2021-10-27 NOTE — Telephone Encounter (Signed)
Pt says that she was taking tylenol but now it taking too much. She wants a refill on rx for arthritis RF request came in on 10/16/21 for Meloxicam which is not on her current med list, this was DCd at her 09/07/21 visit Please advise

## 2021-10-28 MED ORDER — MELOXICAM 7.5 MG PO TABS: 7.5000 mg | ORAL_TABLET | Freq: Every day | ORAL | 1 refills | Status: DC

## 2021-10-28 NOTE — Telephone Encounter (Signed)
LMTCB

## 2021-10-28 NOTE — Addendum Note (Signed)
Addended by: Evelina Dun A on: 10/28/2021 09:16 AM   Modules accepted: Orders

## 2021-10-28 NOTE — Telephone Encounter (Signed)
Mobic 7.5 mg Prescription sent to pharmacy, no other NSAID's while taking this. She can continue tylenol with this.

## 2021-11-19 ENCOUNTER — Other Ambulatory Visit: Payer: Self-pay | Admitting: Family

## 2021-11-19 DIAGNOSIS — E782 Mixed hyperlipidemia: Secondary | ICD-10-CM

## 2021-11-24 ENCOUNTER — Other Ambulatory Visit: Payer: Self-pay | Admitting: Family

## 2021-11-24 DIAGNOSIS — I10 Essential (primary) hypertension: Secondary | ICD-10-CM

## 2021-11-30 ENCOUNTER — Ambulatory Visit (INDEPENDENT_AMBULATORY_CARE_PROVIDER_SITE_OTHER): Payer: Medicare HMO

## 2021-11-30 ENCOUNTER — Ambulatory Visit (INDEPENDENT_AMBULATORY_CARE_PROVIDER_SITE_OTHER): Payer: Medicare HMO | Admitting: Sports Medicine

## 2021-11-30 DIAGNOSIS — M1711 Unilateral primary osteoarthritis, right knee: Secondary | ICD-10-CM | POA: Diagnosis not present

## 2021-11-30 DIAGNOSIS — M170 Bilateral primary osteoarthritis of knee: Secondary | ICD-10-CM

## 2021-11-30 DIAGNOSIS — M1712 Unilateral primary osteoarthritis, left knee: Secondary | ICD-10-CM | POA: Diagnosis not present

## 2021-11-30 DIAGNOSIS — G8929 Other chronic pain: Secondary | ICD-10-CM | POA: Diagnosis not present

## 2021-11-30 MED ORDER — TRIAMCINOLONE ACETONIDE 40 MG/ML IJ SUSP
80.0000 mg | Freq: Once | INTRAMUSCULAR | Status: AC
  Administered 2021-11-30: 80 mg via INTRAMUSCULAR

## 2021-11-30 NOTE — Progress Notes (Signed)
    Procedures performed today:    Procedure: Real-time Ultrasound Guided injection of the left knee Device: Samsung HS60  Verbal informed consent obtained.  Time-out conducted.  Noted no overlying erythema, induration, or other signs of local infection.  Skin prepped in a sterile fashion.  Local anesthesia: Topical Ethyl chloride.  With sterile technique and under real time ultrasound guidance: Mild effusion noted 1 cc Kenalog 40, 2 cc lidocaine, 2 cc bupivacaine injected easily Completed without difficulty  Advised to call if fevers/chills, erythema, induration, drainage, or persistent bleeding.  Images permanently stored and available for review in PACS.  Impression: Technically successful ultrasound guided injection.  Procedure: Real-time Ultrasound Guided injection of the right knee Device: Samsung HS60  Verbal informed consent obtained.  Time-out conducted.  Noted no overlying erythema, induration, or other signs of local infection.  Skin prepped in a sterile fashion.  Local anesthesia: Topical Ethyl chloride.  With sterile technique and under real time ultrasound guidance: moderate effusion noted 1 cc Kenalog 40, 2 cc lidocaine, 2 cc bupivacaine injected easily Completed without difficulty  Advised to call if fevers/chills, erythema, induration, drainage, or persistent bleeding.  Images permanently stored and available for review in PACS.  Impression: Technically successful ultrasound guided injection.  Independent interpretation of notes and tests performed by another provider:   None.  Brief History, Exam, Impression, and Recommendations:    Primary osteoarthritis of both knees This is a very pleasant 81 year old female, she is well-known to [place], we last saw her for her knees in 2017, she had bilateral injections done early 2017 and did really well until now. Now having recurrence of pain, repeat bilateral injections. Adding updated x-rays and home physical  therapy. Return in 6 weeks, if insufficient improvement we will proceed with viscosupplementation.  Chronic process with exacerbation and pharmacologic intervention  ____________________________________________ Gwen Her. Dianah Field, M.D., ABFM., CAQSM., AME. Primary Care and Sports Medicine [facility]  Adjunct Professor of [place] [employer] of Medicine  Risk manager

## 2021-11-30 NOTE — Addendum Note (Signed)
Addended by: Tarri Glenn A on: 11/30/2021 04:28 PM   Modules accepted: Orders

## 2021-11-30 NOTE — Assessment & Plan Note (Signed)
This is a very pleasant 81 year old female, she is well-known to [place], we last saw her for her knees in 2017, she had bilateral injections done early 2017 and did really well until now. Now having recurrence of pain, repeat bilateral injections. Adding updated x-rays and home physical therapy. Return in 6 weeks, if insufficient improvement we will proceed with viscosupplementation.

## 2021-12-17 ENCOUNTER — Other Ambulatory Visit: Payer: Self-pay | Admitting: Family Medicine

## 2021-12-17 ENCOUNTER — Other Ambulatory Visit: Payer: Self-pay | Admitting: Family

## 2021-12-17 DIAGNOSIS — J309 Allergic rhinitis, unspecified: Secondary | ICD-10-CM

## 2021-12-17 DIAGNOSIS — N952 Postmenopausal atrophic vaginitis: Secondary | ICD-10-CM

## 2021-12-17 DIAGNOSIS — M170 Bilateral primary osteoarthritis of knee: Secondary | ICD-10-CM

## 2021-12-17 DIAGNOSIS — N3090 Cystitis, unspecified without hematuria: Secondary | ICD-10-CM

## 2021-12-23 ENCOUNTER — Other Ambulatory Visit: Payer: Self-pay | Admitting: Family

## 2022-01-11 ENCOUNTER — Telehealth: Payer: Self-pay | Admitting: Sports Medicine

## 2022-01-11 ENCOUNTER — Ambulatory Visit (INDEPENDENT_AMBULATORY_CARE_PROVIDER_SITE_OTHER): Payer: Medicare HMO | Admitting: Sports Medicine

## 2022-01-11 DIAGNOSIS — M170 Bilateral primary osteoarthritis of knee: Secondary | ICD-10-CM

## 2022-01-11 MED ORDER — ACETAMINOPHEN ER 650 MG PO TBCR: 650.0000 mg | EXTENDED_RELEASE_TABLET | Freq: Two times a day (BID) | ORAL | 3 refills | Status: AC

## 2022-01-11 NOTE — Progress Notes (Signed)
    Procedures performed today:    None.  Independent interpretation of notes and tests performed by another provider:   None.  Brief History, Exam, Impression, and Recommendations:    Primary osteoarthritis of both knees Alison [name] is a very pleasant 81 year old [name], she is well-known to [place], she has known bilateral knee osteoarthritis, most recently seen on x-rays at the last visit. She had bilateral injections done early in 2017 and returned last month, we did repeat bilateral injections but she only got a couple of weeks of relief. She will do arthritis strength Tylenol twice daily, and I would like to get her approved for viscosupplementation, I will see her back to start Visco injections when approved.    ____________________________________________ Alison [name], M.D., ABFM., CAQSM., AME. Primary Care and Sports Medicine [facility]  Adjunct Professor of [place] [employer] of Medicine  Risk manager

## 2022-01-11 NOTE — Telephone Encounter (Signed)
Approval please, bilateral, x-ray confirmed arthritis, failed greater than 6 weeks of home physical therapy, oral analgesics, steroid injections, bilateral.

## 2022-01-11 NOTE — Assessment & Plan Note (Signed)
Alison [name] is a very pleasant 81 year old female, she is well-known to [place], she has known bilateral knee osteoarthritis, most recently seen on x-rays at the last visit. She had bilateral injections done early in 2017 and returned last month, we did repeat bilateral injections but she only got a couple of weeks of relief. She will do arthritis strength Tylenol twice daily, and I would like to get her approved for viscosupplementation, I will see her back to start Visco injections when approved.

## 2022-01-12 NOTE — Telephone Encounter (Signed)
PA information submitted via [email or web address] for Orthovisc Paperwork has been printed and given to Dr. T for signatures. Once obtained, information will be faxed to [employer] at [phone]  

## 2022-01-19 NOTE — Telephone Encounter (Signed)
Benefits Investigation Details received from [employer] Injection: Orthovisc  Medical: deductible does not apply and once OOP is met patient is covered 100% PA required: Yes PA form and medical records faxed to [place] at [phone]  Fax confirmation received yes  Pharmacy: product not covered over pharmacy plan  Specialty [facility]: [facility]  May fill through: [place] Copay/Coinsurance: 0 Product Copay: 20% Administration Coinsurance: 0 Administration Copay: $25 Out of Pocket Max: $4500 (met: $93.89)

## 2022-02-02 NOTE — Telephone Encounter (Signed)
Her insurance expired so Orthovisc declined it so she will bring a new updated insurance card so we can run it again through [employer].

## 2022-02-09 NOTE — Telephone Encounter (Signed)
PA information submitted via [email or web address] for Orthovisc Paperwork has been printed and given to Dr. T for signatures. Once obtained, information will be faxed to [employer] at [phone]  

## 2022-02-16 NOTE — Telephone Encounter (Signed)
Benefits Investigation Details received from [employer] Injection: Synvisc PA required: No May fill through: [employer] Copay/Coinsurance: $15 Product Copay: 20% Administration Coinsurance: 0 Administration Copay: $15 Deductible: $545 (met: $7) Out of Pocket Max: $3600 (met: $0)

## 2022-02-23 ENCOUNTER — Encounter: Payer: Self-pay | Admitting: Sports Medicine

## 2022-02-23 NOTE — Telephone Encounter (Signed)
Patient notified of cost and is okay with it so will order Synvisc boxes once they come in will call and schedule patient.

## 2022-02-24 ENCOUNTER — Inpatient Hospital Stay: Payer: 59 | Admitting: Nurse Practitioner

## 2022-02-24 ENCOUNTER — Inpatient Hospital Stay: Payer: 59

## 2022-03-10 ENCOUNTER — Ambulatory Visit: Payer: Medicare HMO | Admitting: Family

## 2022-03-13 ENCOUNTER — Other Ambulatory Visit: Payer: Self-pay | Admitting: Family

## 2022-03-13 DIAGNOSIS — F320 Major depressive disorder, single episode, mild: Secondary | ICD-10-CM

## 2022-03-13 DIAGNOSIS — F419 Anxiety disorder, unspecified: Secondary | ICD-10-CM

## 2022-03-21 ENCOUNTER — Other Ambulatory Visit: Payer: Self-pay | Admitting: Family

## 2022-03-24 ENCOUNTER — Ambulatory Visit: Payer: 59 | Admitting: Sports Medicine

## 2022-03-24 ENCOUNTER — Encounter: Payer: Self-pay | Admitting: Radiology

## 2022-03-25 ENCOUNTER — Ambulatory Visit (INDEPENDENT_AMBULATORY_CARE_PROVIDER_SITE_OTHER): Payer: 59 | Admitting: Family

## 2022-03-25 ENCOUNTER — Encounter: Payer: Self-pay | Admitting: Family

## 2022-03-25 VITALS — BP 128/74 | HR 74 | Temp 97.0°F | Ht 64.0 in | Wt 191.0 lb

## 2022-03-25 DIAGNOSIS — Z0000 Encounter for general adult medical examination without abnormal findings: Secondary | ICD-10-CM

## 2022-03-25 DIAGNOSIS — Z853 Personal history of malignant neoplasm of breast: Secondary | ICD-10-CM

## 2022-03-25 DIAGNOSIS — I10 Essential (primary) hypertension: Secondary | ICD-10-CM | POA: Diagnosis not present

## 2022-03-25 DIAGNOSIS — Z0001 Encounter for general adult medical examination with abnormal findings: Secondary | ICD-10-CM

## 2022-03-25 DIAGNOSIS — R300 Dysuria: Secondary | ICD-10-CM | POA: Diagnosis not present

## 2022-03-25 DIAGNOSIS — M170 Bilateral primary osteoarthritis of knee: Secondary | ICD-10-CM

## 2022-03-25 DIAGNOSIS — K5900 Constipation, unspecified: Secondary | ICD-10-CM

## 2022-03-25 DIAGNOSIS — E782 Mixed hyperlipidemia: Secondary | ICD-10-CM

## 2022-03-25 DIAGNOSIS — F320 Major depressive disorder, single episode, mild: Secondary | ICD-10-CM | POA: Diagnosis not present

## 2022-03-25 DIAGNOSIS — F419 Anxiety disorder, unspecified: Secondary | ICD-10-CM

## 2022-03-25 LAB — MICROSCOPIC EXAMINATION
Epithelial Cells (non renal): NONE SEEN /hpf (ref 0–10)
RBC, Urine: NONE SEEN /hpf (ref 0–2)
Renal Epithel, UA: NONE SEEN /hpf
WBC, UA: NONE SEEN /hpf (ref 0–5)

## 2022-03-25 LAB — URINALYSIS, COMPLETE
Bilirubin, UA: NEGATIVE
Glucose, UA: NEGATIVE
Ketones, UA: NEGATIVE
Leukocytes,UA: NEGATIVE
Nitrite, UA: NEGATIVE
Protein,UA: NEGATIVE
RBC, UA: NEGATIVE
Specific Gravity, UA: 1.02 (ref 1.005–1.030)
Urobilinogen, Ur: 1 mg/dL (ref 0.2–1.0)
pH, UA: 7 (ref 5.0–7.5)

## 2022-03-25 NOTE — Patient Instructions (Signed)

## 2022-03-25 NOTE — Progress Notes (Signed)
Subjective:    Patient ID: Alison [name], female    DOB: 1940-04-17, 82 y.o.   MRN: [id number]  Chief Complaint  Patient presents with   Medical Management of Chronic Issues   PT presents to the office today for chronic follow up. She is followed by Oncologists for Left breast cancer in 1998 and right breast cancer 2010.    She is followed by Ortho for bilateral knee pain and getting gel injections.  Hypertension This is a chronic problem. The current episode started more than 1 year ago. The problem has been resolved since onset. The problem is controlled. Associated symptoms include anxiety. Pertinent negatives include no malaise/fatigue, peripheral edema or shortness of breath. Risk factors for coronary artery disease include obesity, dyslipidemia and sedentary lifestyle. The current treatment provides moderate improvement. There is no history of heart failure.  Arthritis Presents for follow-up visit. She complains of pain and stiffness. Affected locations include the right knee and left knee. Her pain is at a severity of 8/10. Associated symptoms include dysuria. Pertinent negatives include no fatigue.  Hyperlipidemia This is a chronic problem. The current episode started more than 1 year ago. The problem is uncontrolled. Recent lipid tests were reviewed and are high. Exacerbating diseases include obesity. Pertinent negatives include no shortness of breath. Current antihyperlipidemic treatment includes statins. The current treatment provides moderate improvement of lipids. Risk factors for coronary artery disease include dyslipidemia, hypertension, post-menopausal and a sedentary lifestyle.  Depression        This is a chronic problem.  The current episode started more than 1 year ago.   Associated symptoms include helplessness, hopelessness and restlessness.  Associated symptoms include no fatigue, not irritable, no decreased interest and not sad.  Past treatments include SSRIs - Selective  serotonin reuptake inhibitors.  Past medical history includes anxiety.   Anxiety Presents for follow-up visit. Symptoms include excessive worry, hyperventilation, nervous/anxious behavior and restlessness. Patient reports no irritability or shortness of breath. Symptoms occur rarely. The severity of symptoms is mild.    Constipation This is a chronic problem. The current episode started more than 1 year ago. The problem has been waxing and waning since onset. Risk factors include obesity. She has tried laxatives for the symptoms. The treatment provided moderate relief.  Dysuria  This is a new problem. The current episode started in the past 7 days. The problem occurs intermittently. The quality of the pain is described as burning. The pain is at a severity of 8/10. The pain is mild. There has been no fever. Associated symptoms include frequency and urgency. Pertinent negatives include no hematuria or hesitancy.      Review of Systems  Constitutional:  Negative for fatigue, irritability and malaise/fatigue.  Respiratory:  Negative for shortness of breath.   Gastrointestinal:  Positive for constipation.  Genitourinary:  Positive for dysuria, frequency and urgency. Negative for hematuria and hesitancy.  Musculoskeletal:  Positive for arthritis and stiffness.  Psychiatric/Behavioral:  Positive for depression. The patient is nervous/anxious.   All other systems reviewed and are negative.      Objective:   Physical Exam Vitals reviewed.  Constitutional:      General: She is not irritable.She is not in acute distress.    Appearance: She is well-developed. She is obese.  HENT:     Head: Normocephalic and atraumatic.     Right Ear: Tympanic membrane normal.     Left Ear: Tympanic membrane normal.  Eyes:     Pupils: Pupils are  equal, round, and reactive to light.  Neck:     Thyroid: No thyromegaly.  Cardiovascular:     Rate and Rhythm: Normal rate and regular rhythm.     Heart sounds:  Normal heart sounds. No murmur heard. Pulmonary:     Effort: Pulmonary effort is normal. No respiratory distress.     Breath sounds: Normal breath sounds. No wheezing.  Abdominal:     General: Bowel sounds are normal. There is no distension.     Palpations: Abdomen is soft.     Tenderness: There is no abdominal tenderness.  Musculoskeletal:        General: Tenderness (pain in bilateral knees with flexion) present. Normal range of motion.     Cervical back: Normal range of motion and neck supple.  Skin:    General: Skin is warm and dry.  Neurological:     Mental Status: She is alert and oriented to person, place, and time.     Cranial Nerves: No cranial nerve deficit.     Deep Tendon Reflexes: Reflexes are normal and symmetric.  Psychiatric:        Behavior: Behavior normal.        Thought Content: Thought content normal.        Judgment: Judgment normal.       BP 128/74   Pulse 74   Temp (!) 97 F (36.1 C) (Temporal)   Ht '5\' 4"'$  (1.626 m)   Wt 191 lb (86.6 kg)   LMP 01/25/2004   SpO2 99%   BMI 32.79 kg/m      Assessment & Plan:  Alison [name] comes in today with chief complaint of Medical Management of Chronic Issues   Diagnosis and orders addressed:  1. Annual physical exam - CMP14+EGFR - CBC with Differential/Platelet - Lipid panel - TSH  2. Primary osteoarthritis of both knees - CMP14+EGFR - CBC with Differential/Platelet  3. HX: breast cancer - CMP14+EGFR - CBC with Differential/Platelet  4. Depression, major, single episode, mild (HCC) - CMP14+EGFR - CBC with Differential/Platelet  5. Moderate mixed hyperlipidemia not requiring statin therapy - CMP14+EGFR - CBC with Differential/Platelet  6. Hypertension with goal blood pressure less than 130/80 - CMP14+EGFR - CBC with Differential/Platelet  7. Constipation, unspecified constipation type - CMP14+EGFR - CBC with Differential/Platelet  8. Anxiety - CMP14+EGFR - CBC with  Differential/Platelet  9. Dysuria - Urinalysis, Complete   Labs pending Health Maintenance reviewed Diet and exercise encouraged  Follow up plan: 6 months    Evelina Dun, FNP

## 2022-03-26 LAB — CMP14+EGFR
ALT: 18 IU/L (ref 0–32)
AST: 21 IU/L (ref 0–40)
Albumin/Globulin Ratio: 1.7 (ref 1.2–2.2)
Albumin: 4.5 g/dL (ref 3.7–4.7)
Alkaline Phosphatase: 72 IU/L (ref 44–121)
BUN/Creatinine Ratio: 23 (ref 12–28)
BUN: 18 mg/dL (ref 8–27)
Bilirubin Total: 1.3 mg/dL — ABNORMAL HIGH (ref 0.0–1.2)
CO2: 23 mmol/L (ref 20–29)
Calcium: 10 mg/dL (ref 8.7–10.3)
Chloride: 100 mmol/L (ref 96–106)
Creatinine, Ser: 0.77 mg/dL (ref 0.57–1.00)
Globulin, Total: 2.7 g/dL (ref 1.5–4.5)
Glucose: 95 mg/dL (ref 70–99)
Potassium: 3.8 mmol/L (ref 3.5–5.2)
Sodium: 139 mmol/L (ref 134–144)
Total Protein: 7.2 g/dL (ref 6.0–8.5)
eGFR: 77 mL/min/{1.73_m2} (ref >59)

## 2022-03-26 LAB — LIPID PANEL
Chol/HDL Ratio: 3.1 ratio (ref 0.0–4.4)
Cholesterol, Total: 176 mg/dL (ref 100–199)
HDL: 56 mg/dL (ref >39)
LDL Chol Calc (NIH): 103 mg/dL — ABNORMAL HIGH (ref 0–99)
Triglycerides: 90 mg/dL (ref 0–149)
VLDL Cholesterol Cal: 17 mg/dL (ref 5–40)

## 2022-03-26 LAB — CBC WITH DIFFERENTIAL/PLATELET
Basophils Absolute: 0 10*3/uL (ref 0.0–0.2)
Basos: 1 %
EOS (ABSOLUTE): 0.1 10*3/uL (ref 0.0–0.4)
Eos: 1 %
Hematocrit: 37.8 % (ref 34.0–46.6)
Hemoglobin: 12.9 g/dL (ref 11.1–15.9)
Immature Grans (Abs): 0 10*3/uL (ref 0.0–0.1)
Immature Granulocytes: 0 %
Lymphocytes Absolute: 1.2 10*3/uL (ref 0.7–3.1)
Lymphs: 23 %
MCH: 33.5 pg — ABNORMAL HIGH (ref 26.6–33.0)
MCHC: 34.1 g/dL (ref 31.5–35.7)
MCV: 98 fL — ABNORMAL HIGH (ref 79–97)
Monocytes Absolute: 0.5 10*3/uL (ref 0.1–0.9)
Monocytes: 10 %
Neutrophils Absolute: 3.5 10*3/uL (ref 1.4–7.0)
Neutrophils: 65 %
Platelets: 275 10*3/uL (ref 150–450)
RBC: 3.85 x10E6/uL (ref 3.77–5.28)
RDW: 12.3 % (ref 11.7–15.4)
WBC: 5.3 10*3/uL (ref 3.4–10.8)

## 2022-03-26 LAB — TSH: TSH: 0.834 u[IU]/mL (ref 0.450–4.500)

## 2022-03-28 ENCOUNTER — Other Ambulatory Visit: Payer: Self-pay | Admitting: Family

## 2022-03-28 MED ORDER — FLUCONAZOLE 150 MG PO TABS: 150.0000 mg | ORAL_TABLET | ORAL | 0 refills | Status: DC | PRN

## 2022-04-01 ENCOUNTER — Ambulatory Visit (INDEPENDENT_AMBULATORY_CARE_PROVIDER_SITE_OTHER): Payer: 59 | Admitting: Sports Medicine

## 2022-04-01 ENCOUNTER — Ambulatory Visit (INDEPENDENT_AMBULATORY_CARE_PROVIDER_SITE_OTHER): Payer: 59

## 2022-04-01 DIAGNOSIS — M170 Bilateral primary osteoarthritis of knee: Secondary | ICD-10-CM

## 2022-04-01 MED ORDER — HYLAN G-F 20 16 MG/2ML IX SOSY
16.0000 mg | PREFILLED_SYRINGE | Freq: Once | INTRA_ARTICULAR | Status: AC
  Administered 2022-04-01: 16 mg via INTRA_ARTICULAR

## 2022-04-01 NOTE — Progress Notes (Addendum)
    Procedures performed today:    Procedure: Real-time Ultrasound Guided injection of the left knee Device: Samsung HS60  Verbal informed consent obtained.  Time-out conducted.  Noted no overlying erythema, induration, or other signs of local infection.  Skin prepped in a sterile fashion.  Local anesthesia: Topical Ethyl chloride.  With sterile technique and under real time ultrasound guidance: Noted trace effusion, 1 syringe of Synvisc injected easily into the suprapatellar recess. Completed without difficulty  Advised to call if fevers/chills, erythema, induration, drainage, or persistent bleeding.  Images permanently stored and available for review in PACS.  Impression: Technically successful ultrasound guided injection.  Procedure: Real-time Ultrasound Guided aspiration/injection of the right knee Device: Samsung HS60  Verbal informed consent obtained.  Time-out conducted.  Noted no overlying erythema, induration, or other signs of local infection.  Skin prepped in a sterile fashion.  Local anesthesia: Topical Ethyl chloride.  With sterile technique and under real time ultrasound guidance: I advanced an 18-gauge needle into the suprapatellar recess, aspirated 40 mL of clear, straw-colored fluid, syringe switched and 1 syringe of Synvisc injected easily into the suprapatellar recess. Completed without difficulty  Advised to call if fevers/chills, erythema, induration, drainage, or persistent bleeding.  Images permanently stored and available for review in PACS.  Impression: Technically successful ultrasound guided injection.  Independent interpretation of notes and tests performed by another provider:   None.  Brief History, Exam, Impression, and Recommendations:    Primary osteoarthritis of both knees Synvisc injection #1 of 3 both knees, return in 1 week for #2 of 3. Adding home health physical therapy.    ____________________________________________ Gwen Her.  Dianah Field, M.D., ABFM., CAQSM., AME. Primary Care and Sports Medicine [facility]  Adjunct Professor of [place] [employer] [facility] [employer] of Medicine  Risk manager

## 2022-04-01 NOTE — Addendum Note (Signed)
Addended by: Silverio Decamp on: 04/01/2022 02:23 PM   Modules accepted: Orders

## 2022-04-01 NOTE — Assessment & Plan Note (Addendum)
Synvisc injection #1 of 3 both knees, return in 1 week for #2 of 3. Adding home health physical therapy.

## 2022-04-01 NOTE — Addendum Note (Signed)
Addended by: Tarri Glenn A on: 04/01/2022 02:28 PM   Modules accepted: Orders

## 2022-04-06 DIAGNOSIS — M5416 Radiculopathy, lumbar region: Secondary | ICD-10-CM | POA: Diagnosis not present

## 2022-04-06 DIAGNOSIS — M170 Bilateral primary osteoarthritis of knee: Secondary | ICD-10-CM | POA: Diagnosis not present

## 2022-04-06 DIAGNOSIS — I10 Essential (primary) hypertension: Secondary | ICD-10-CM | POA: Diagnosis not present

## 2022-04-06 DIAGNOSIS — F32A Depression, unspecified: Secondary | ICD-10-CM | POA: Diagnosis not present

## 2022-04-08 ENCOUNTER — Other Ambulatory Visit (INDEPENDENT_AMBULATORY_CARE_PROVIDER_SITE_OTHER): Payer: 59

## 2022-04-08 ENCOUNTER — Ambulatory Visit (INDEPENDENT_AMBULATORY_CARE_PROVIDER_SITE_OTHER): Payer: 59 | Admitting: Sports Medicine

## 2022-04-08 DIAGNOSIS — M170 Bilateral primary osteoarthritis of knee: Secondary | ICD-10-CM

## 2022-04-08 MED ORDER — HYLAN G-F 20 16 MG/2ML IX SOSY
16.0000 mg | PREFILLED_SYRINGE | Freq: Once | INTRA_ARTICULAR | Status: AC
  Administered 2022-04-08: 16 mg via INTRA_ARTICULAR

## 2022-04-08 NOTE — Progress Notes (Addendum)
    Procedures performed today:    Procedure: Real-time Ultrasound Guided injection of the left knee Device: Samsung HS60  Verbal informed consent obtained.  Time-out conducted.  Noted no overlying erythema, induration, or other signs of local infection.  Skin prepped in a sterile fashion.  Local anesthesia: Topical Ethyl chloride.  With sterile technique and under real time ultrasound guidance: Noted trace effusion, 1 syringe of Synvisc injected easily into the suprapatellar recess. Completed without difficulty  Advised to call if fevers/chills, erythema, induration, drainage, or persistent bleeding.  Images permanently stored and available for review in PACS.  Impression: Technically successful ultrasound guided injection.   Procedure: Real-time Ultrasound Guided aspiration/injection of the right knee Device: Samsung HS60  Verbal informed consent obtained.  Time-out conducted.  Noted no overlying erythema, induration, or other signs of local infection.  Skin prepped in a sterile fashion.  Local anesthesia: Topical Ethyl chloride.  With sterile technique and under real time ultrasound guidance: I advanced an 18-gauge needle into the suprapatellar recess, aspirated 28 mL of clear, straw-colored fluid, syringe switched and 1 syringe of Synvisc injected easily into the suprapatellar recess. Completed without difficulty  Advised to call if fevers/chills, erythema, induration, drainage, or persistent bleeding.  Images permanently stored and available for review in PACS.  Impression: Technically successful ultrasound guided injection.  Independent interpretation of notes and tests performed by another provider:   None.  Brief History, Exam, Impression, and Recommendations:    Primary osteoarthritis of both knees Alison [name] is a pleasant 82 year old female, she has x-ray confirmed knee osteoarthritis, she has failed conservative treatment including steroid injections, analgesics and NSAIDs,  she has significant weakness and would benefit from home health physical therapy. On exam she has tenderness at the joint lines bilaterally as well as a right knee effusion, 4/5 strength to extension bilaterally. Ligamentous structures are stable. Synvisc injection #2 of 3 both knees, return in 1 week for #3 of 3.    ____________________________________________ Gwen Her. [name], M.D., ABFM., CAQSM., AME. Primary Care and Sports Medicine [facility]  Adjunct Professor of [place] [employer] [facility] [employer] of Medicine  Risk manager

## 2022-04-08 NOTE — Addendum Note (Signed)
Addended by: Silverio Decamp on: 04/08/2022 02:33 PM   Modules accepted: Orders

## 2022-04-08 NOTE — Addendum Note (Signed)
Addended by: Tarri Glenn A on: 04/08/2022 02:20 PM   Modules accepted: Orders

## 2022-04-08 NOTE — Assessment & Plan Note (Addendum)
Alison [name] is a pleasant 82 year old female, she has x-ray confirmed knee osteoarthritis, she has failed conservative treatment including steroid injections, analgesics and NSAIDs, she has significant weakness and would benefit from home health physical therapy. On exam she has tenderness at the joint lines bilaterally as well as a right knee effusion, 4/5 strength to extension bilaterally. Ligamentous structures are stable. Synvisc injection #2 of 3 both knees, return in 1 week for #3 of 3.

## 2022-04-11 DIAGNOSIS — F32A Depression, unspecified: Secondary | ICD-10-CM | POA: Diagnosis not present

## 2022-04-11 DIAGNOSIS — M5416 Radiculopathy, lumbar region: Secondary | ICD-10-CM | POA: Diagnosis not present

## 2022-04-11 DIAGNOSIS — I10 Essential (primary) hypertension: Secondary | ICD-10-CM | POA: Diagnosis not present

## 2022-04-11 DIAGNOSIS — M170 Bilateral primary osteoarthritis of knee: Secondary | ICD-10-CM | POA: Diagnosis not present

## 2022-04-13 DIAGNOSIS — F32A Depression, unspecified: Secondary | ICD-10-CM | POA: Diagnosis not present

## 2022-04-13 DIAGNOSIS — M5416 Radiculopathy, lumbar region: Secondary | ICD-10-CM | POA: Diagnosis not present

## 2022-04-13 DIAGNOSIS — M170 Bilateral primary osteoarthritis of knee: Secondary | ICD-10-CM | POA: Diagnosis not present

## 2022-04-13 DIAGNOSIS — I10 Essential (primary) hypertension: Secondary | ICD-10-CM | POA: Diagnosis not present

## 2022-04-14 ENCOUNTER — Ambulatory Visit: Payer: 59 | Admitting: Sports Medicine

## 2022-04-15 ENCOUNTER — Other Ambulatory Visit (INDEPENDENT_AMBULATORY_CARE_PROVIDER_SITE_OTHER): Payer: 59

## 2022-04-15 ENCOUNTER — Ambulatory Visit (INDEPENDENT_AMBULATORY_CARE_PROVIDER_SITE_OTHER): Payer: 59 | Admitting: Sports Medicine

## 2022-04-15 DIAGNOSIS — M170 Bilateral primary osteoarthritis of knee: Secondary | ICD-10-CM

## 2022-04-15 NOTE — Progress Notes (Signed)
    Procedures performed today:    Procedure: Real-time Ultrasound Guided injection of the left knee Device: Samsung HS60  Verbal informed consent obtained.  Time-out conducted.  Noted no overlying erythema, induration, or other signs of local infection.  Skin prepped in a sterile fashion.  Local anesthesia: Topical Ethyl chloride.  With sterile technique and under real time ultrasound guidance: Noted trace effusion, 1 syringe of Synvisc injected easily into the suprapatellar recess. Completed without difficulty  Advised to call if fevers/chills, erythema, induration, drainage, or persistent bleeding.  Images permanently stored and available for review in PACS.  Impression: Technically successful ultrasound guided injection.   Procedure: Real-time Ultrasound Guided aspiration/injection of the right knee Device: Samsung HS60  Verbal informed consent obtained.  Time-out conducted.  Noted no overlying erythema, induration, or other signs of local infection.  Skin prepped in a sterile fashion.  Local anesthesia: Topical Ethyl chloride.  With sterile technique and under real time ultrasound guidance: I advanced an 18-gauge needle into the suprapatellar recess, aspirated 30 mL of clear, straw-colored fluid, syringe switched and 1 syringe of Synvisc injected easily into the suprapatellar recess. Completed without difficulty  Advised to call if fevers/chills, erythema, induration, drainage, or persistent bleeding.  Images permanently stored and available for review in PACS.  Impression: Technically successful ultrasound guided injection.  Independent interpretation of notes and tests performed by another provider:   None.  Brief History, Exam, Impression, and Recommendations:    Primary osteoarthritis of both knees Synvisc No. 3 of 3 both knees, return in a month.    ____________________________________________ Gwen Her. Dianah Field, M.D., ABFM., CAQSM., AME. Primary Care and  Sports Medicine [facility]  Adjunct Professor of [place] [employer] of Medicine  Risk manager

## 2022-04-15 NOTE — Assessment & Plan Note (Signed)
Synvisc No. 3 of 3 both knees, return in a month.

## 2022-04-18 DIAGNOSIS — M5416 Radiculopathy, lumbar region: Secondary | ICD-10-CM | POA: Diagnosis not present

## 2022-04-18 DIAGNOSIS — F32A Depression, unspecified: Secondary | ICD-10-CM | POA: Diagnosis not present

## 2022-04-18 DIAGNOSIS — I10 Essential (primary) hypertension: Secondary | ICD-10-CM | POA: Diagnosis not present

## 2022-04-18 DIAGNOSIS — M170 Bilateral primary osteoarthritis of knee: Secondary | ICD-10-CM | POA: Diagnosis not present

## 2022-04-20 DIAGNOSIS — M170 Bilateral primary osteoarthritis of knee: Secondary | ICD-10-CM | POA: Diagnosis not present

## 2022-04-20 DIAGNOSIS — I10 Essential (primary) hypertension: Secondary | ICD-10-CM | POA: Diagnosis not present

## 2022-04-20 DIAGNOSIS — F32A Depression, unspecified: Secondary | ICD-10-CM | POA: Diagnosis not present

## 2022-04-20 DIAGNOSIS — M5416 Radiculopathy, lumbar region: Secondary | ICD-10-CM | POA: Diagnosis not present

## 2022-04-28 DIAGNOSIS — M5416 Radiculopathy, lumbar region: Secondary | ICD-10-CM | POA: Diagnosis not present

## 2022-04-28 DIAGNOSIS — F32A Depression, unspecified: Secondary | ICD-10-CM | POA: Diagnosis not present

## 2022-04-28 DIAGNOSIS — M170 Bilateral primary osteoarthritis of knee: Secondary | ICD-10-CM | POA: Diagnosis not present

## 2022-04-28 DIAGNOSIS — I10 Essential (primary) hypertension: Secondary | ICD-10-CM | POA: Diagnosis not present

## 2022-04-29 DIAGNOSIS — F32A Depression, unspecified: Secondary | ICD-10-CM | POA: Diagnosis not present

## 2022-04-29 DIAGNOSIS — M5416 Radiculopathy, lumbar region: Secondary | ICD-10-CM | POA: Diagnosis not present

## 2022-04-29 DIAGNOSIS — I10 Essential (primary) hypertension: Secondary | ICD-10-CM | POA: Diagnosis not present

## 2022-04-29 DIAGNOSIS — M170 Bilateral primary osteoarthritis of knee: Secondary | ICD-10-CM | POA: Diagnosis not present

## 2022-05-02 ENCOUNTER — Other Ambulatory Visit: Payer: Self-pay | Admitting: Family

## 2022-05-02 DIAGNOSIS — F32A Depression, unspecified: Secondary | ICD-10-CM | POA: Diagnosis not present

## 2022-05-02 DIAGNOSIS — M5416 Radiculopathy, lumbar region: Secondary | ICD-10-CM | POA: Diagnosis not present

## 2022-05-02 DIAGNOSIS — I10 Essential (primary) hypertension: Secondary | ICD-10-CM

## 2022-05-02 DIAGNOSIS — M170 Bilateral primary osteoarthritis of knee: Secondary | ICD-10-CM | POA: Diagnosis not present

## 2022-05-06 DIAGNOSIS — M5416 Radiculopathy, lumbar region: Secondary | ICD-10-CM | POA: Diagnosis not present

## 2022-05-06 DIAGNOSIS — I10 Essential (primary) hypertension: Secondary | ICD-10-CM | POA: Diagnosis not present

## 2022-05-06 DIAGNOSIS — F32A Depression, unspecified: Secondary | ICD-10-CM | POA: Diagnosis not present

## 2022-05-06 DIAGNOSIS — M170 Bilateral primary osteoarthritis of knee: Secondary | ICD-10-CM | POA: Diagnosis not present

## 2022-05-09 DIAGNOSIS — M170 Bilateral primary osteoarthritis of knee: Secondary | ICD-10-CM | POA: Diagnosis not present

## 2022-05-09 DIAGNOSIS — F32A Depression, unspecified: Secondary | ICD-10-CM | POA: Diagnosis not present

## 2022-05-09 DIAGNOSIS — M5416 Radiculopathy, lumbar region: Secondary | ICD-10-CM | POA: Diagnosis not present

## 2022-05-09 DIAGNOSIS — I10 Essential (primary) hypertension: Secondary | ICD-10-CM | POA: Diagnosis not present

## 2022-05-11 DIAGNOSIS — I10 Essential (primary) hypertension: Secondary | ICD-10-CM | POA: Diagnosis not present

## 2022-05-11 DIAGNOSIS — M170 Bilateral primary osteoarthritis of knee: Secondary | ICD-10-CM | POA: Diagnosis not present

## 2022-05-11 DIAGNOSIS — M5416 Radiculopathy, lumbar region: Secondary | ICD-10-CM | POA: Diagnosis not present

## 2022-05-11 DIAGNOSIS — F32A Depression, unspecified: Secondary | ICD-10-CM | POA: Diagnosis not present

## 2022-05-19 ENCOUNTER — Ambulatory Visit: Payer: 59 | Admitting: Sports Medicine

## 2022-05-27 ENCOUNTER — Ambulatory Visit (INDEPENDENT_AMBULATORY_CARE_PROVIDER_SITE_OTHER): Payer: 59 | Admitting: Sports Medicine

## 2022-05-27 ENCOUNTER — Other Ambulatory Visit (INDEPENDENT_AMBULATORY_CARE_PROVIDER_SITE_OTHER): Payer: 59

## 2022-05-27 DIAGNOSIS — M170 Bilateral primary osteoarthritis of knee: Secondary | ICD-10-CM | POA: Diagnosis not present

## 2022-05-27 NOTE — Progress Notes (Signed)
    Procedures performed today:    Procedure: Real-time Ultrasound Guided injection of the left knee Device: Samsung HS60  Verbal informed consent obtained.  Time-out conducted.  Noted no overlying erythema, induration, or other signs of local infection.  Skin prepped in a sterile fashion.  Local anesthesia: Topical Ethyl chloride.  With sterile technique and under real time ultrasound guidance: Moderate effusion 1 cc Kenalog 40, 2 cc lidocaine, 2 cc bupivacaine injected easily Completed without difficulty  Advised to call if fevers/chills, erythema, induration, drainage, or persistent bleeding.  Images permanently stored and available for review in PACS.  Impression: Technically successful ultrasound guided injection.  Procedure: Real-time Ultrasound Guided injection of the right knee Device: Samsung HS60  Verbal informed consent obtained.  Time-out conducted.  Noted no overlying erythema, induration, or other signs of local infection.  Skin prepped in a sterile fashion.  Local anesthesia: Topical Ethyl chloride.  With sterile technique and under real time ultrasound guidance: Moderate effusion 1 cc Kenalog 40, 2 cc lidocaine, 2 cc bupivacaine injected easily Completed without difficulty  Advised to call if fevers/chills, erythema, induration, drainage, or persistent bleeding.  Images permanently stored and available for review in PACS.  Impression: Technically successful ultrasound guided injection.  Independent interpretation of notes and tests performed by another provider:   None.  Brief History, Exam, Impression, and Recommendations:    Primary osteoarthritis of both knees Alison [name] returns, she has done really well with Synvisc but she still has some residual pain, she has not had a steroid injection in greater than 4 to 5 months. We will do bilateral steroid injections today to give her a jumpstart and I would like her to work with formal PT in the medicine office rather  than home health PT. I would still like to see her back on an as-needed basis at this point. Continue meloxicam. Not interested in arthroplasty.    ____________________________________________ Ihor Austin. Benjamin Stain, M.D., ABFM., CAQSM., AME. Primary Care and Sports Medicine [facility]  Adjunct Professor of Family Medicine  [place] of [facility] of Medicine  Restaurant manager, fast food

## 2022-05-27 NOTE — Assessment & Plan Note (Signed)
Alison [name] returns, she has done really well with Synvisc but she still has some residual pain, she has not had a steroid injection in greater than 4 to 5 months. We will do bilateral steroid injections today to give her a jumpstart and I would like her to work with formal PT in the medicine office rather than home health PT. I would still like to see her back on an as-needed basis at this point. Continue meloxicam. Not interested in arthroplasty.

## 2022-06-14 ENCOUNTER — Other Ambulatory Visit: Payer: Self-pay | Admitting: Hematology

## 2022-06-14 ENCOUNTER — Telehealth: Payer: Self-pay | Admitting: Family

## 2022-06-14 DIAGNOSIS — Z1231 Encounter for screening mammogram for malignant neoplasm of breast: Secondary | ICD-10-CM

## 2022-06-14 DIAGNOSIS — Z1211 Encounter for screening for malignant neoplasm of colon: Secondary | ICD-10-CM

## 2022-06-14 NOTE — Telephone Encounter (Signed)
Pt received letter in the mail stating that it was time for her to have cologuard done again because it has been 3 years. Needs Christy to order for her.

## 2022-06-16 NOTE — Telephone Encounter (Signed)
Order placed

## 2022-06-18 ENCOUNTER — Other Ambulatory Visit: Payer: Self-pay | Admitting: Family

## 2022-06-18 DIAGNOSIS — I10 Essential (primary) hypertension: Secondary | ICD-10-CM

## 2022-06-18 DIAGNOSIS — E782 Mixed hyperlipidemia: Secondary | ICD-10-CM

## 2022-06-19 ENCOUNTER — Other Ambulatory Visit: Payer: Self-pay | Admitting: Family

## 2022-06-19 DIAGNOSIS — F419 Anxiety disorder, unspecified: Secondary | ICD-10-CM

## 2022-06-19 DIAGNOSIS — F320 Major depressive disorder, single episode, mild: Secondary | ICD-10-CM

## 2022-06-27 ENCOUNTER — Ambulatory Visit: Payer: 59 | Attending: Sports Medicine

## 2022-06-27 ENCOUNTER — Other Ambulatory Visit: Payer: Self-pay

## 2022-06-27 DIAGNOSIS — G8929 Other chronic pain: Secondary | ICD-10-CM | POA: Diagnosis present

## 2022-06-27 DIAGNOSIS — M170 Bilateral primary osteoarthritis of knee: Secondary | ICD-10-CM | POA: Insufficient documentation

## 2022-06-27 DIAGNOSIS — M25562 Pain in left knee: Secondary | ICD-10-CM | POA: Insufficient documentation

## 2022-06-27 DIAGNOSIS — M25561 Pain in right knee: Secondary | ICD-10-CM | POA: Diagnosis present

## 2022-06-27 NOTE — Therapy (Signed)
OUTPATIENT PHYSICAL THERAPY LOWER EXTREMITY EVALUATION   Patient Name: Alison [name] MRN: [id number] DOB:[birth year], 82 y.o., female Today's Date: 06/27/2022  END OF SESSION:  PT End of Session - 06/27/22 1017     Visit Number 1    Number of Visits 12    Date for PT Re-Evaluation 08/12/22    PT Start Time 1020    PT Stop Time 1100    PT Time Calculation (min) 40 min    Activity Tolerance Patient tolerated treatment well    Behavior During Therapy WFL for tasks assessed/performed             Past Medical History:  Diagnosis Date   Anxiety    Arthritis    Breast cancer (HCC)    X2   Cervical dysplasia    GERD (gastroesophageal reflux disease)    takes otc meds   Glaucoma    Hyperlipidemia    Hypertension    Lichen sclerosus    Personal history of chemotherapy 2010   Personal history of radiation therapy 1999   Shingles 2012   Past Surgical History:  Procedure Laterality Date   BREAST BIOPSY Right 2010   BREAST LUMPECTOMY Right 2010   BREAST LUMPECTOMY Left    BREAST SURGERY     Lumpectomy right and left   GYNECOLOGIC CRYOSURGERY     HYSTEROSCOPY     PORT-A-CATH REMOVAL Left 01/31/2014   Procedure: REMOVAL PORT-A-CATH;  Surgeon: Chevis Pretty III, MD;  Location[facility];  Service: General;  Laterality: Left;   TUBAL LIGATION     Patient Active Problem List   Diagnosis Date Noted   Constipation 03/30/2021   Anxiety 08/14/2018   Left lumbar radiculopathy 06/16/2015   Trochanteric bursitis of left hip 06/04/2015   HX: breast cancer 01/01/2015   Depression, major, single episode, mild (HCC) 06/13/2013   Hyperlipidemia 06/13/2013   Hypertension with goal blood pressure less than 130/80 06/13/2013   Primary osteoarthritis of both knees 04/17/2013   Tendinitis, de Quervain's 01/20/2012   Lichen sclerosus     PCP: Junie Spencer, FNP  REFERRING PROVIDER: Monica Becton, MD   REFERRING DIAG: Primary osteoarthritis of both knees   THERAPY DIAG:   Chronic pain of right knee  Chronic pain of left knee  Rationale for Evaluation and Treatment: Rehabilitation  ONSET DATE: "years ago"   SUBJECTIVE:   SUBJECTIVE STATEMENT: Patient reports that her knees have bothering her for years with her pain steadily getting worse. She notes that her right knee bothers her more than her left, but her left is getting worse. She had injections and gel shots in her knees when seemed to have helped. She had home health for about four weeks over a month ago. She did not notice a major difference until she had the shots in her knees. She has noticed some unsteadiness when she first stands up, but this does not happen often as she will sometimes forget her cane.   PERTINENT HISTORY: Hypertension, osteoarthritis, history of breast cancer, depression, and anxiety PAIN:  Are you having pain? Yes: NPRS scale: 8/10 Pain location: bilateral knees Pain description: intermittent aching Aggravating factors: cooking, prolonged standing and walking (5-10 minutes)  Relieving factors: elevating her legs, injections  PRECAUTIONS: None  WEIGHT BEARING RESTRICTIONS: No  FALLS:  Has patient fallen in last 6 months? No  LIVING ENVIRONMENT: Lives with: lives alone Lives in: House/apartment Stairs: No Has following equipment at home: Single point cane  OCCUPATION: retired  PLOF:  Independent  PATIENT GOALS: reduced pain and be able to stand longer  NEXT MD VISIT: none scheduled  OBJECTIVE:   PATIENT SURVEYS:  FOTO 50.96  COGNITION: Overall cognitive status: Within functional limits for tasks assessed     SENSATION: Patient reports no numbness or tingling  EDEMA:  Mild left ankle edema observed  PALPATION: TTP:   Right lower extremity: hip adductors, medial and lateral joint line, hamstrings, and gastrocnemius/soleus  Left lower extremity: Iliotibial band and lateral joint line   LOWER EXTREMITY ROM:  Active ROM Right eval Left eval  Hip  flexion    Hip extension    Hip abduction    Hip adduction    Hip internal rotation    Hip external rotation    Knee flexion 95; "tight"  118  Knee extension 4 0  Ankle dorsiflexion    Ankle plantarflexion    Ankle inversion    Ankle eversion     (Blank rows = not tested)  LOWER EXTREMITY MMT:  MMT Right eval Left eval  Hip flexion 3+/5 4-/5  Hip extension    Hip abduction    Hip adduction    Hip internal rotation    Hip external rotation    Knee flexion 3/5; familiar pain 4-/5; slight pain  Knee extension 3+/5; familiar pain 4/5  Ankle dorsiflexion 4-/5 4-/5  Ankle plantarflexion    Ankle inversion    Ankle eversion     (Blank rows = not tested)  GAIT: Assistive device utilized: Single point cane Level of assistance: Modified independence Comments: Step through pattern with decreased stride length and gait speed   TODAY'S TREATMENT:                                                                                                                              DATE:     PATIENT EDUCATION:  Education details: Plan of care, healing, anatomy, arthritis, prognosis, benefits of therapy, and goals of therapy Person educated: Patient Education method: Explanation Education comprehension: verbalized understanding  HOME EXERCISE PROGRAM:   ASSESSMENT:  CLINICAL IMPRESSION: Patient is a 82 y.o. female who was seen today for physical therapy evaluation and treatment for bilateral knee pain.  She presented with high pain severity and moderate irritability with manual muscle testing being the most aggravating to her familiar symptoms.  She exhibited reduced right knee mobility compared to the left knee.  Recommend that she continue with skilled physical therapy to address her impairments to maximize her functional mobility.  OBJECTIVE IMPAIRMENTS: Abnormal gait, decreased activity tolerance, decreased mobility, difficulty walking, decreased ROM, decreased strength,  hypomobility, increased edema, and pain.   ACTIVITY LIMITATIONS: carrying, lifting, standing, squatting, stairs, transfers, and locomotion level  PARTICIPATION LIMITATIONS: meal prep, cleaning, laundry, shopping, and community activity  PERSONAL FACTORS: Age, Past/current experiences, Time since onset of injury/illness/exacerbation, and 3+ comorbidities: Hypertension, osteoarthritis, history of breast cancer, depression, and anxiety  are also affecting patient's functional outcome.  REHAB POTENTIAL: Good  CLINICAL DECISION MAKING: Evolving/moderate complexity  EVALUATION COMPLEXITY: Moderate   GOALS: Goals reviewed with patient? Yes  SHORT TERM GOALS: Target date: 07/18/22 Patient will be independent with her initial HEP. Baseline: Goal status: INITIAL  2.  Patient will be able to complete her daily activities without her familiar knee pain exceeding 6/10. Baseline:  Goal status: INITIAL  3.  Patient will be able to demonstrate at least 105 degrees of active right knee flexion for improved knee mobility. Baseline:  Goal status: INITIAL  4.  Patient will be able to stand for at least 15 minutes for improved function cooking. Baseline:  Goal status: INITIAL  LONG TERM GOALS: Target date: 08/08/22  Patient will be independent with her advanced HEP. Baseline:  Goal status: INITIAL  2.  Patient will be able to complete her daily activities without her familiar pain exceeding 4/10. Baseline:  Goal status: INITIAL  3.  Patient will be able to stand for at least 30 minutes without being limited by her familiar symptoms for improved function completing her household activities. Baseline:  Goal status: INITIAL  4.  Patient will be able to demonstrate at least 115 degrees of active right knee flexion for improved knee mobility. Baseline:  Goal status: INITIAL  PLAN:  PT FREQUENCY: 1-2x/week  PT DURATION: 6 weeks  PLANNED INTERVENTIONS: Therapeutic exercises, Therapeutic  activity, Neuromuscular re-education, Balance training, Gait training, Patient/Family education, Self Care, Joint mobilization, Stair training, Electrical stimulation, Cryotherapy, Moist heat, Vasopneumatic device, Manual therapy, and Re-evaluation  PLAN FOR NEXT SESSION: NuStep, lower extremity strengthening, and modalities as needed   Granville Lewis, PT 06/27/2022, 4:42 PM

## 2022-06-30 DIAGNOSIS — Z1211 Encounter for screening for malignant neoplasm of colon: Secondary | ICD-10-CM | POA: Diagnosis not present

## 2022-07-01 ENCOUNTER — Ambulatory Visit: Payer: 59 | Admitting: *Deleted

## 2022-07-01 DIAGNOSIS — G8929 Other chronic pain: Secondary | ICD-10-CM

## 2022-07-01 DIAGNOSIS — M25561 Pain in right knee: Secondary | ICD-10-CM | POA: Diagnosis not present

## 2022-07-01 DIAGNOSIS — M170 Bilateral primary osteoarthritis of knee: Secondary | ICD-10-CM | POA: Diagnosis not present

## 2022-07-01 DIAGNOSIS — M25562 Pain in left knee: Secondary | ICD-10-CM | POA: Diagnosis not present

## 2022-07-01 NOTE — Therapy (Signed)
OUTPATIENT PHYSICAL THERAPY LOWER EXTREMITY EVALUATION   Patient Name: Alison [name] MRN: [id number] DOB:[birth year], 82 y.o., female Today's Date: 07/01/2022  END OF SESSION:  PT End of Session - 07/01/22 1028     Visit Number 2    Number of Visits 12    PT Start Time 1015             Past Medical History:  Diagnosis Date   Anxiety    Arthritis    Breast cancer (HCC)    X2   Cervical dysplasia    GERD (gastroesophageal reflux disease)    takes otc meds   Glaucoma    Hyperlipidemia    Hypertension    Lichen sclerosus    Personal history of chemotherapy 2010   Personal history of radiation therapy 1999   Shingles 2012   Past Surgical History:  Procedure Laterality Date   BREAST BIOPSY Right 2010   BREAST LUMPECTOMY Right 2010   BREAST LUMPECTOMY Left    BREAST SURGERY     Lumpectomy right and left   GYNECOLOGIC CRYOSURGERY     HYSTEROSCOPY     PORT-A-CATH REMOVAL Left 01/31/2014   Procedure: REMOVAL PORT-A-CATH;  Surgeon: Chevis Pretty III, MD;  Location: [facility] OR;  Service: General;  Laterality: Left;   TUBAL LIGATION     Patient Active Problem List   Diagnosis Date Noted   Constipation 03/30/2021   Anxiety 08/14/2018   Left lumbar radiculopathy 06/16/2015   Trochanteric bursitis of left hip 06/04/2015   HX: breast cancer 01/01/2015   Depression, major, single episode, mild (HCC) 06/13/2013   Hyperlipidemia 06/13/2013   Hypertension with goal blood pressure less than 130/80 06/13/2013   Primary osteoarthritis of both knees 04/17/2013   Tendinitis, de Quervain's 01/20/2012   Lichen sclerosus     PCP: Junie Spencer, FNP  REFERRING PROVIDER: Monica Becton, MD   REFERRING DIAG: Primary osteoarthritis of both knees   THERAPY DIAG:  Chronic pain of right knee  Chronic pain of left knee  Rationale for Evaluation and Treatment: Rehabilitation  ONSET DATE: "years ago"   SUBJECTIVE:   SUBJECTIVE STATEMENT: Patient reports that her knees have  bothering her for years with her pain steadily getting worse. RT 7/10, LT 4-5/10  PERTINENT HISTORY: Hypertension, osteoarthritis, history of breast cancer, depression, and anxiety PAIN:  Are you having pain? Yes: NPRS scale: 7/10 Pain location: bilateral knees Pain description: intermittent aching Aggravating factors: cooking, prolonged standing and walking (5-10 minutes)  Relieving factors: elevating her legs, injections  PRECAUTIONS: None  WEIGHT BEARING RESTRICTIONS: No  FALLS:  Has patient fallen in last 6 months? No  LIVING ENVIRONMENT: Lives with: lives alone Lives in: House/apartment Stairs: No Has following equipment at home: Single point cane  OCCUPATION: retired  PLOF: Independent  PATIENT GOALS: reduced pain and be able to stand longer  NEXT MD VISIT: none scheduled  OBJECTIVE:   PATIENT SURVEYS:  FOTO 50.96  COGNITION: Overall cognitive status: Within functional limits for tasks assessed     SENSATION: Patient reports no numbness or tingling  EDEMA:  Mild left ankle edema observed  PALPATION: TTP:   Right lower extremity: hip adductors, medial and lateral joint line, hamstrings, and gastrocnemius/soleus  Left lower extremity: Iliotibial band and lateral joint line   LOWER EXTREMITY ROM:  Active ROM Right eval Left eval  Hip flexion    Hip extension    Hip abduction    Hip adduction    Hip internal rotation  Hip external rotation    Knee flexion 95; "tight"  118  Knee extension 4 0  Ankle dorsiflexion    Ankle plantarflexion    Ankle inversion    Ankle eversion     (Blank rows = not tested)  LOWER EXTREMITY MMT:  MMT Right eval Left eval  Hip flexion 3+/5 4-/5  Hip extension    Hip abduction    Hip adduction    Hip internal rotation    Hip external rotation    Knee flexion 3/5; familiar pain 4-/5; slight pain  Knee extension 3+/5; familiar pain 4/5  Ankle dorsiflexion 4-/5 4-/5  Ankle plantarflexion    Ankle inversion     Ankle eversion     (Blank rows = not tested)  GAIT: Assistive device utilized: Single point cane Level of assistance: Modified independence Comments: Step through pattern with decreased stride length and gait speed   TODAY'S TREATMENT:                                                                                                                              DATE:                                       07-01-22                                    EXERCISE LOG     Bil. Knees  Exercise Repetitions and Resistance Comments  Nustep L3 x 12 min   LAQ's 2#   2x10   HS curl  Red 2x10   Seated clam Red 2x10   Ball squeeze 2x10 hold 5 secs   Rocker board X 3 mins    Blank cell = exercise not performed today    PATIENT EDUCATION:  Education details: Plan of care, healing, anatomy, arthritis, prognosis, benefits of therapy, and goals of therapy Person educated: Patient Education method: Explanation Education comprehension: verbalized understanding  HOME EXERCISE PROGRAM:   ASSESSMENT:  CLINICAL IMPRESSION: Pt arrived doing fairly well, but RT knee 7/10,  LT 4/10. Rx focused on Bil LE  pain free exs in sitting as well as standing and did well. Pt reports mainly fatigue end of session.     OBJECTIVE IMPAIRMENTS: Abnormal gait, decreased activity tolerance, decreased mobility, difficulty walking, decreased ROM, decreased strength, hypomobility, increased edema, and pain.   ACTIVITY LIMITATIONS: carrying, lifting, standing, squatting, stairs, transfers, and locomotion level  PARTICIPATION LIMITATIONS: meal prep, cleaning, laundry, shopping, and community activity  PERSONAL FACTORS: Age, Past/current experiences, Time since onset of injury/illness/exacerbation, and 3+ comorbidities: Hypertension, osteoarthritis, history of breast cancer, depression, and anxiety  are also affecting patient's functional outcome.   REHAB POTENTIAL: Good  CLINICAL DECISION MAKING: Evolving/moderate  complexity  EVALUATION COMPLEXITY: Moderate   GOALS: Goals  reviewed with patient? Yes  SHORT TERM GOALS: Target date: 07/18/22 Patient will be independent with her initial HEP. Baseline: Goal status: INITIAL  2.  Patient will be able to complete her daily activities without her familiar knee pain exceeding 6/10. Baseline:  Goal status: INITIAL  3.  Patient will be able to demonstrate at least 105 degrees of active right knee flexion for improved knee mobility. Baseline:  Goal status: INITIAL  4.  Patient will be able to stand for at least 15 minutes for improved function cooking. Baseline:  Goal status: INITIAL  LONG TERM GOALS: Target date: 08/08/22  Patient will be independent with her advanced HEP. Baseline:  Goal status: INITIAL  2.  Patient will be able to complete her daily activities without her familiar pain exceeding 4/10. Baseline:  Goal status: INITIAL  3.  Patient will be able to stand for at least 30 minutes without being limited by her familiar symptoms for improved function completing her household activities. Baseline:  Goal status: INITIAL  4.  Patient will be able to demonstrate at least 115 degrees of active right knee flexion for improved knee mobility. Baseline:  Goal status: INITIAL  PLAN:  PT FREQUENCY: 1-2x/week  PT DURATION: 6 weeks  PLANNED INTERVENTIONS: Therapeutic exercises, Therapeutic activity, Neuromuscular re-education, Balance training, Gait training, Patient/Family education, Self Care, Joint mobilization, Stair training, Electrical stimulation, Cryotherapy, Moist heat, Vasopneumatic device, Manual therapy, and Re-evaluation  PLAN FOR NEXT SESSION: NuStep, lower extremity strengthening, and modalities as needed   Orey Moure,CHRIS, PTA 07/01/2022, 11:14 AM

## 2022-07-04 ENCOUNTER — Ambulatory Visit: Payer: 59 | Admitting: Physical Therapy

## 2022-07-04 DIAGNOSIS — M25562 Pain in left knee: Secondary | ICD-10-CM | POA: Diagnosis not present

## 2022-07-04 DIAGNOSIS — G8929 Other chronic pain: Secondary | ICD-10-CM | POA: Diagnosis not present

## 2022-07-04 DIAGNOSIS — M170 Bilateral primary osteoarthritis of knee: Secondary | ICD-10-CM | POA: Diagnosis not present

## 2022-07-04 DIAGNOSIS — M25561 Pain in right knee: Secondary | ICD-10-CM | POA: Diagnosis not present

## 2022-07-04 NOTE — Therapy (Signed)
OUTPATIENT PHYSICAL THERAPY LOWER EXTREMITY EVALUATION   Patient Name: Alison [name] MRN: [id number] DOB:March 02, 1940, 82 y.o., female Today's Date: 07/04/2022  END OF SESSION:  PT End of Session - 07/04/22 1413     Visit Number 3    Number of Visits 12    Date for PT Re-Evaluation 08/12/22    PT Start Time 0100    PT Stop Time 0144    PT Time Calculation (min) 44 min    Activity Tolerance Patient tolerated treatment well    Behavior During Therapy WFL for tasks assessed/performed             Past Medical History:  Diagnosis Date   Anxiety    Arthritis    Breast cancer (HCC)    X2   Cervical dysplasia    GERD (gastroesophageal reflux disease)    takes otc meds   Glaucoma    Hyperlipidemia    Hypertension    Lichen sclerosus    Personal history of chemotherapy 2010   Personal history of radiation therapy 1999   Shingles 2012   Past Surgical History:  Procedure Laterality Date   BREAST BIOPSY Right 2010   BREAST LUMPECTOMY Right 2010   BREAST LUMPECTOMY Left    BREAST SURGERY     Lumpectomy right and left   GYNECOLOGIC CRYOSURGERY     HYSTEROSCOPY     PORT-A-CATH REMOVAL Left 01/31/2014   Procedure: REMOVAL PORT-A-CATH;  Surgeon: Chevis Pretty III, MD;  Location: [facility] OR;  Service: General;  Laterality: Left;   TUBAL LIGATION     Patient Active Problem List   Diagnosis Date Noted   Constipation 03/30/2021   Anxiety 08/14/2018   Left lumbar radiculopathy 06/16/2015   Trochanteric bursitis of left hip 06/04/2015   HX: breast cancer 01/01/2015   Depression, major, single episode, mild (HCC) 06/13/2013   Hyperlipidemia 06/13/2013   Hypertension with goal blood pressure less than 130/80 06/13/2013   Primary osteoarthritis of both knees 04/17/2013   Tendinitis, de Quervain's 01/20/2012   Lichen sclerosus     PCP: Junie Spencer, FNP  REFERRING PROVIDER: Monica Becton, MD   REFERRING DIAG: Primary osteoarthritis of both knees   THERAPY DIAG:   Chronic pain of right knee  Chronic pain of left knee  Rationale for Evaluation and Treatment: Rehabilitation  ONSET DATE: "years ago"   SUBJECTIVE:   SUBJECTIVE STATEMENT: Patient reports that her knees have bothering her for years with her pain steadily getting worse. RT 7/10, LT 4-5/10  PERTINENT HISTORY: Hypertension, osteoarthritis, history of breast cancer, depression, and anxiety PAIN:  Are you having pain? Yes: NPRS scale: 7/10 Pain location: bilateral knees Pain description: intermittent aching Aggravating factors: cooking, prolonged standing and walking (5-10 minutes)  Relieving factors: elevating her legs, injections  PRECAUTIONS: None  WEIGHT BEARING RESTRICTIONS: No  FALLS:  Has patient fallen in last 6 months? No  LIVING ENVIRONMENT: Lives with: lives alone Lives in: House/apartment Stairs: No Has following equipment at home: Single point cane  OCCUPATION: retired  PLOF: Independent  PATIENT GOALS: reduced pain and be able to stand longer  NEXT MD VISIT: none scheduled  OBJECTIVE:   PATIENT SURVEYS:  FOTO 50.96  COGNITION: Overall cognitive status: Within functional limits for tasks assessed     SENSATION: Patient reports no numbness or tingling  EDEMA:  Mild left ankle edema observed  PALPATION: TTP:   Right lower extremity: hip adductors, medial and lateral joint line, hamstrings, and gastrocnemius/soleus  Left lower extremity:  Iliotibial band and lateral joint line   LOWER EXTREMITY ROM:  Active ROM Right eval Left eval  Hip flexion    Hip extension    Hip abduction    Hip adduction    Hip internal rotation    Hip external rotation    Knee flexion 95; "tight"  118  Knee extension 4 0  Ankle dorsiflexion    Ankle plantarflexion    Ankle inversion    Ankle eversion     (Blank rows = not tested)  LOWER EXTREMITY MMT:  MMT Right eval Left eval  Hip flexion 3+/5 4-/5  Hip extension    Hip abduction    Hip adduction     Hip internal rotation    Hip external rotation    Knee flexion 3/5; familiar pain 4-/5; slight pain  Knee extension 3+/5; familiar pain 4/5  Ankle dorsiflexion 4-/5 4-/5  Ankle plantarflexion    Ankle inversion    Ankle eversion     (Blank rows = not tested)  GAIT: Assistive device utilized: Single point cane Level of assistance: Modified independence Comments: Step through pattern with decreased stride length and gait speed   TODAY'S TREATMENT:                                                                                                                              DATE:                                       07-04-22                                    EXERCISE LOG     Bil. Knees  Exercise Repetitions and Resistance Comments  Nustep L3 x 16 min   LAQ's #3  x 3 minutes   HS curl  Red 2 minute each side (4 minutes total)   Seated clam Red 3 minutes   Ball squeeze 2 minutes   Rocker board X 3 mins    STW x 7 minutes to patient's right  knee medial and lateral joint line.   PATIENT EDUCATION:  Education details: Plan of care, healing, anatomy, arthritis, prognosis, benefits of therapy, and goals of therapy Person educated: Patient Education method: Explanation Education comprehension: verbalized understanding  HOME EXERCISE PROGRAM:   ASSESSMENT:  CLINICAL IMPRESSION: Pt arrived doing fairly well, but RT knee 7/10.  She did well with there ex today.  She was tender to palpation over her right knee medial and lateral joint lines.  Good response to STW/M.     OBJECTIVE IMPAIRMENTS: Abnormal gait, decreased activity tolerance, decreased mobility, difficulty walking, decreased ROM, decreased strength, hypomobility, increased edema, and pain.   ACTIVITY LIMITATIONS: carrying, lifting, standing, squatting, stairs, transfers, and locomotion level  PARTICIPATION LIMITATIONS: meal  prep, cleaning, laundry, shopping, and community activity  PERSONAL FACTORS: Age, Past/current  experiences, Time since onset of injury/illness/exacerbation, and 3+ comorbidities: Hypertension, osteoarthritis, history of breast cancer, depression, and anxiety  are also affecting patient's functional outcome.   REHAB POTENTIAL: Good  CLINICAL DECISION MAKING: Evolving/moderate complexity  EVALUATION COMPLEXITY: Moderate   GOALS: Goals reviewed with patient? Yes  SHORT TERM GOALS: Target date: 07/18/22 Patient will be independent with her initial HEP. Baseline: Goal status: INITIAL  2.  Patient will be able to complete her daily activities without her familiar knee pain exceeding 6/10. Baseline:  Goal status: INITIAL  3.  Patient will be able to demonstrate at least 105 degrees of active right knee flexion for improved knee mobility. Baseline:  Goal status: INITIAL  4.  Patient will be able to stand for at least 15 minutes for improved function cooking. Baseline:  Goal status: INITIAL  LONG TERM GOALS: Target date: 08/08/22  Patient will be independent with her advanced HEP. Baseline:  Goal status: INITIAL  2.  Patient will be able to complete her daily activities without her familiar pain exceeding 4/10. Baseline:  Goal status: INITIAL  3.  Patient will be able to stand for at least 30 minutes without being limited by her familiar symptoms for improved function completing her household activities. Baseline:  Goal status: INITIAL  4.  Patient will be able to demonstrate at least 115 degrees of active right knee flexion for improved knee mobility. Baseline:  Goal status: INITIAL  PLAN:  PT FREQUENCY: 1-2x/week  PT DURATION: 6 weeks  PLANNED INTERVENTIONS: Therapeutic exercises, Therapeutic activity, Neuromuscular re-education, Balance training, Gait training, Patient/Family education, Self Care, Joint mobilization, Stair training, Electrical stimulation, Cryotherapy, Moist heat, Vasopneumatic device, Manual therapy, and Re-evaluation  PLAN FOR NEXT SESSION:  NuStep, lower extremity strengthening, and modalities as needed   Onelia Cadmus, [place], PT 07/04/2022, 3:01 PM

## 2022-07-07 ENCOUNTER — Ambulatory Visit: Payer: 59 | Admitting: Physical Therapy

## 2022-07-07 ENCOUNTER — Encounter: Payer: Self-pay | Admitting: Physical Therapy

## 2022-07-07 DIAGNOSIS — G8929 Other chronic pain: Secondary | ICD-10-CM | POA: Diagnosis not present

## 2022-07-07 DIAGNOSIS — M25562 Pain in left knee: Secondary | ICD-10-CM | POA: Diagnosis not present

## 2022-07-07 DIAGNOSIS — M170 Bilateral primary osteoarthritis of knee: Secondary | ICD-10-CM | POA: Diagnosis not present

## 2022-07-07 DIAGNOSIS — M25561 Pain in right knee: Secondary | ICD-10-CM | POA: Diagnosis not present

## 2022-07-07 LAB — COLOGUARD: COLOGUARD: NEGATIVE

## 2022-07-07 NOTE — Therapy (Signed)
OUTPATIENT PHYSICAL THERAPY LOWER EXTREMITY TREATMENT   Patient Name: Alison [name] MRN: [id number] DOB:02-06-1940, 82 y.o., female Today's Date: 07/07/2022  END OF SESSION:  PT End of Session - 07/07/22 1302     Visit Number 4    Number of Visits 12    Date for PT Re-Evaluation 08/12/22    PT Start Time 1301    PT Stop Time 1351    PT Time Calculation (min) 50 min    Equipment Utilized During Treatment Other (comment)   SPC   Activity Tolerance Patient tolerated treatment well    Behavior During Therapy WFL for tasks assessed/performed            Past Medical History:  Diagnosis Date   Anxiety    Arthritis    Breast cancer (HCC)    X2   Cervical dysplasia    GERD (gastroesophageal reflux disease)    takes otc meds   Glaucoma    Hyperlipidemia    Hypertension    Lichen sclerosus    Personal history of chemotherapy 2010   Personal history of radiation therapy 1999   Shingles 2012   Past Surgical History:  Procedure Laterality Date   BREAST BIOPSY Right 2010   BREAST LUMPECTOMY Right 2010   BREAST LUMPECTOMY Left    BREAST SURGERY     Lumpectomy right and left   GYNECOLOGIC CRYOSURGERY     HYSTEROSCOPY     PORT-A-CATH REMOVAL Left 01/31/2014   Procedure: REMOVAL PORT-A-CATH;  Surgeon: Chevis Pretty III, MD;  Location: [facility] OR;  Service: General;  Laterality: Left;   TUBAL LIGATION     Patient Active Problem List   Diagnosis Date Noted   Constipation 03/30/2021   Anxiety 08/14/2018   Left lumbar radiculopathy 06/16/2015   Trochanteric bursitis of left hip 06/04/2015   HX: breast cancer 01/01/2015   Depression, major, single episode, mild (HCC) 06/13/2013   Hyperlipidemia 06/13/2013   Hypertension with goal blood pressure less than 130/80 06/13/2013   Primary osteoarthritis of both knees 04/17/2013   Tendinitis, de Quervain's 01/20/2012   Lichen sclerosus    PCP: Junie Spencer, FNP  REFERRING PROVIDER: Monica Becton, MD   REFERRING DIAG:  Primary osteoarthritis of both knees   THERAPY DIAG:  Chronic pain of right knee  Chronic pain of left knee  Rationale for Evaluation and Treatment: Rehabilitation  ONSET DATE: "years ago"   SUBJECTIVE:   SUBJECTIVE STATEMENT: R knee pain > L knee.  PERTINENT HISTORY: Hypertension, osteoarthritis, history of breast cancer, depression, and anxiety  PAIN:  Are you having pain? Yes: NPRS scale: 6/10 Pain location: bilateral knees Pain description: intermittent aching Aggravating factors: cooking, prolonged standing and walking (5-10 minutes)  Relieving factors: elevating her legs, injections  PRECAUTIONS: None  WEIGHT BEARING RESTRICTIONS: No  PATIENT GOALS: reduced pain and be able to stand longer  NEXT MD VISIT: none scheduled  OBJECTIVE:   PATIENT SURVEYS:  FOTO 50.96  EDEMA:  Mild left ankle edema observed  PALPATION: TTP:   Right lower extremity: hip adductors, medial and lateral joint line, hamstrings, and gastrocnemius/soleus  Left lower extremity: Iliotibial band and lateral joint line   LOWER EXTREMITY ROM:  Active ROM Right eval Left eval  Hip flexion    Hip extension    Hip abduction    Hip adduction    Hip internal rotation    Hip external rotation    Knee flexion 95; "tight"  118  Knee extension 4 0  Ankle dorsiflexion    Ankle plantarflexion    Ankle inversion    Ankle eversion     (Blank rows = not tested)  LOWER EXTREMITY MMT:  MMT Right eval Left eval  Hip flexion 3+/5 4-/5  Hip extension    Hip abduction    Hip adduction    Hip internal rotation    Hip external rotation    Knee flexion 3/5; familiar pain 4-/5; slight pain  Knee extension 3+/5; familiar pain 4/5  Ankle dorsiflexion 4-/5 4-/5  Ankle plantarflexion    Ankle inversion    Ankle eversion     (Blank rows = not tested)  GAIT: Assistive device utilized: Single point cane Level of assistance: Modified independence Comments: Step through pattern with  decreased stride length and gait speed  TODAY'S TREATMENT:                                                                                                                              DATE: 07-04-22  EXERCISE LOG     B Knees  Exercise Repetitions and Resistance Comments  Nustep L4 x 15 min   LAQ's #3  x20 reps   HS curl  Red theraband x15 reps    Seated clam Red 3 minutes   Hip abduction X20 reps   Forward steps  6" step x10 reps each   Rocker board X 3 mins   Heel/toe raises X20 reps each    PATIENT EDUCATION:  Education details: Plan of care, healing, anatomy, arthritis, prognosis, benefits of therapy, and goals of therapy Person educated: Patient Education method: Explanation Education comprehension: verbalized understanding  HOME EXERCISE PROGRAM:  ASSESSMENT:  CLINICAL IMPRESSION:  Patient presented in clinic with 6/10 knee pain although R knee > L knee. Patient able to tolerate therex with fairly well although limited with standing forward step ups due to pain especially with RLE ascending. Patient better tolerant of seated exercise with moderate resistance. No complaints following end of PT session.  OBJECTIVE IMPAIRMENTS: Abnormal gait, decreased activity tolerance, decreased mobility, difficulty walking, decreased ROM, decreased strength, hypomobility, increased edema, and pain.   ACTIVITY LIMITATIONS: carrying, lifting, standing, squatting, stairs, transfers, and locomotion level  PARTICIPATION LIMITATIONS: meal prep, cleaning, laundry, shopping, and community activity  PERSONAL FACTORS: Age, Past/current experiences, Time since onset of injury/illness/exacerbation, and 3+ comorbidities: Hypertension, osteoarthritis, history of breast cancer, depression, and anxiety  are also affecting patient's functional outcome.   REHAB POTENTIAL: Good  CLINICAL DECISION MAKING: Evolving/moderate complexity  EVALUATION COMPLEXITY: Moderate   GOALS: Goals reviewed with patient?  Yes  SHORT TERM GOALS: Target date: 07/18/22 Patient will be independent with her initial HEP. Baseline: Goal status: INITIAL  2.  Patient will be able to complete her daily activities without her familiar knee pain exceeding 6/10. Baseline:  Goal status: INITIAL  3.  Patient will be able to demonstrate at least 105 degrees of active right knee flexion for improved knee mobility. Baseline:  Goal status: INITIAL  4.  Patient will be able to stand for at least 15 minutes for improved function cooking. Baseline:  Goal status: INITIAL  LONG TERM GOALS: Target date: 08/08/22  Patient will be independent with her advanced HEP. Baseline:  Goal status: INITIAL  2.  Patient will be able to complete her daily activities without her familiar pain exceeding 4/10. Baseline:  Goal status: INITIAL  3.  Patient will be able to stand for at least 30 minutes without being limited by her familiar symptoms for improved function completing her household activities. Baseline:  Goal status: INITIAL  4.  Patient will be able to demonstrate at least 115 degrees of active right knee flexion for improved knee mobility. Baseline:  Goal status: INITIAL  PLAN:  PT FREQUENCY: 1-2x/week  PT DURATION: 6 weeks  PLANNED INTERVENTIONS: Therapeutic exercises, Therapeutic activity, Neuromuscular re-education, Balance training, Gait training, Patient/Family education, Self Care, Joint mobilization, Stair training, Electrical stimulation, Cryotherapy, Moist heat, Vasopneumatic device, Manual therapy, and Re-evaluation  PLAN FOR NEXT SESSION: NuStep, lower extremity strengthening, and modalities as needed  Marvell Fuller, PTA 07/07/2022, 1:56 PM

## 2022-07-11 ENCOUNTER — Encounter: Payer: Self-pay | Admitting: *Deleted

## 2022-07-11 ENCOUNTER — Ambulatory Visit: Payer: 59 | Admitting: *Deleted

## 2022-07-11 DIAGNOSIS — M25562 Pain in left knee: Secondary | ICD-10-CM | POA: Diagnosis not present

## 2022-07-11 DIAGNOSIS — G8929 Other chronic pain: Secondary | ICD-10-CM

## 2022-07-11 DIAGNOSIS — M25561 Pain in right knee: Secondary | ICD-10-CM | POA: Diagnosis not present

## 2022-07-11 DIAGNOSIS — M170 Bilateral primary osteoarthritis of knee: Secondary | ICD-10-CM | POA: Diagnosis not present

## 2022-07-11 NOTE — Therapy (Signed)
OUTPATIENT PHYSICAL THERAPY LOWER EXTREMITY TREATMENT   Patient Name: Alison [name] MRN: [id number] DOB:August 25, 1940, 82 y.o., female Today's Date: 07/11/2022  END OF SESSION:  PT End of Session - 07/11/22 1317     Visit Number 5    Number of Visits 12    Date for PT Re-Evaluation 08/12/22    PT Start Time 1302    PT Stop Time 1352    PT Time Calculation (min) 50 min            Past Medical History:  Diagnosis Date   Anxiety    Arthritis    Breast cancer (HCC)    X2   Cervical dysplasia    GERD (gastroesophageal reflux disease)    takes otc meds   Glaucoma    Hyperlipidemia    Hypertension    Lichen sclerosus    Personal history of chemotherapy 2010   Personal history of radiation therapy 1999   Shingles 2012   Past Surgical History:  Procedure Laterality Date   BREAST BIOPSY Right 2010   BREAST LUMPECTOMY Right 2010   BREAST LUMPECTOMY Left    BREAST SURGERY     Lumpectomy right and left   GYNECOLOGIC CRYOSURGERY     HYSTEROSCOPY     PORT-A-CATH REMOVAL Left 01/31/2014   Procedure: REMOVAL PORT-A-CATH;  Surgeon: Chevis Pretty III, MD;  Location: [facility] OR;  Service: General;  Laterality: Left;   TUBAL LIGATION     Patient Active Problem List   Diagnosis Date Noted   Constipation 03/30/2021   Anxiety 08/14/2018   Left lumbar radiculopathy 06/16/2015   Trochanteric bursitis of left hip 06/04/2015   HX: breast cancer 01/01/2015   Depression, major, single episode, mild (HCC) 06/13/2013   Hyperlipidemia 06/13/2013   Hypertension with goal blood pressure less than 130/80 06/13/2013   Primary osteoarthritis of both knees 04/17/2013   Tendinitis, de Quervain's 01/20/2012   Lichen sclerosus    PCP: Junie Spencer, FNP  REFERRING PROVIDER: Monica Becton, MD   REFERRING DIAG: Primary osteoarthritis of both knees   THERAPY DIAG:  Chronic pain of right knee  Chronic pain of left knee  Rationale for Evaluation and Treatment: Rehabilitation  ONSET  DATE: "years ago"   SUBJECTIVE:   SUBJECTIVE STATEMENT: Both of my knees are hurting 6/10 today. Did okay after last Rx.  PERTINENT HISTORY: Hypertension, osteoarthritis, history of breast cancer, depression, and anxiety  PAIN:  Are you having pain? Yes: NPRS scale: 6/10 Pain location: bilateral knees Pain description: intermittent aching Aggravating factors: cooking, prolonged standing and walking (5-10 minutes)  Relieving factors: elevating her legs, injections  PRECAUTIONS: None  WEIGHT BEARING RESTRICTIONS: No  PATIENT GOALS: reduced pain and be able to stand longer  NEXT MD VISIT: none scheduled  OBJECTIVE:   PATIENT SURVEYS:  FOTO 50.96  EDEMA:  Mild left ankle edema observed  PALPATION: TTP:   Right lower extremity: hip adductors, medial and lateral joint line, hamstrings, and gastrocnemius/soleus  Left lower extremity: Iliotibial band and lateral joint line   LOWER EXTREMITY ROM:  Active ROM Right eval Left eval  Hip flexion    Hip extension    Hip abduction    Hip adduction    Hip internal rotation    Hip external rotation    Knee flexion 95; "tight"  118  Knee extension 4 0  Ankle dorsiflexion    Ankle plantarflexion    Ankle inversion    Ankle eversion     (Blank rows =  not tested)  LOWER EXTREMITY MMT:  MMT Right eval Left eval  Hip flexion 3+/5 4-/5  Hip extension    Hip abduction    Hip adduction    Hip internal rotation    Hip external rotation    Knee flexion 3/5; familiar pain 4-/5; slight pain  Knee extension 3+/5; familiar pain 4/5  Ankle dorsiflexion 4-/5 4-/5  Ankle plantarflexion    Ankle inversion    Ankle eversion     (Blank rows = not tested)  GAIT: Assistive device utilized: Single point cane Level of assistance: Modified independence Comments: Step through pattern with decreased stride length and gait speed  TODAY'S TREATMENT:                                                                                                                               DATE:                                                       07-11-22  EXERCISE LOG     B Knees  Exercise Repetitions and Resistance Comments  Nustep L4 x 15 min   LAQ's #4  3x10 reps   HS curl  Red theraband x15 reps each   Seated clam    Hip abduction    Forward steps  6" step x10 reps each   8in box lunge X 10 each with focus on quad control   Rocker board X 3 mins   Heel/toe raises X20 reps each    PATIENT EDUCATION:  Education details: Plan of care, healing, anatomy, arthritis, prognosis, benefits of therapy, and goals of therapy Person educated: Patient Education method: Explanation Education comprehension: verbalized understanding  HOME EXERCISE PROGRAM:  ASSESSMENT:  CLINICAL IMPRESSION:  Patient presented in clinic with 6/10 knee pain Both knees. Rx focused on standing as well as sitting Bil.  LE strengthening exs with some progressions and Pt did well with mainly fatigue end of session  OBJECTIVE IMPAIRMENTS: Abnormal gait, decreased activity tolerance, decreased mobility, difficulty walking, decreased ROM, decreased strength, hypomobility, increased edema, and pain.   ACTIVITY LIMITATIONS: carrying, lifting, standing, squatting, stairs, transfers, and locomotion level  PARTICIPATION LIMITATIONS: meal prep, cleaning, laundry, shopping, and community activity  PERSONAL FACTORS: Age, Past/current experiences, Time since onset of injury/illness/exacerbation, and 3+ comorbidities: Hypertension, osteoarthritis, history of breast cancer, depression, and anxiety  are also affecting patient's functional outcome.   REHAB POTENTIAL: Good  CLINICAL DECISION MAKING: Evolving/moderate complexity  EVALUATION COMPLEXITY: Moderate   GOALS: Goals reviewed with patient? Yes  SHORT TERM GOALS: Target date: 07/18/22 Patient will be independent with her initial HEP. Baseline: Goal status: MET  2.  Patient will be able to complete her daily  activities without her familiar knee pain exceeding 6/10. Baseline:  Goal status:  ON going  3.  Patient will be able to demonstrate at least 105 degrees of active right knee flexion for improved knee mobility. Baseline:  Goal status: On going  4.  Patient will be able to stand for at least 15 minutes for improved function cooking. Baseline:  Goal status: On going  LONG TERM GOALS: Target date: 08/08/22  Patient will be independent with her advanced HEP. Baseline:  Goal status: INITIAL  2.  Patient will be able to complete her daily activities without her familiar pain exceeding 4/10. Baseline:  Goal status: INITIAL  3.  Patient will be able to stand for at least 30 minutes without being limited by her familiar symptoms for improved function completing her household activities. Baseline:  Goal status: INITIAL  4.  Patient will be able to demonstrate at least 115 degrees of active right knee flexion for improved knee mobility. Baseline:  Goal status: INITIAL  PLAN:  PT FREQUENCY: 1-2x/week  PT DURATION: 6 weeks  PLANNED INTERVENTIONS: Therapeutic exercises, Therapeutic activity, Neuromuscular re-education, Balance training, Gait training, Patient/Family education, Self Care, Joint mobilization, Stair training, Electrical stimulation, Cryotherapy, Moist heat, Vasopneumatic device, Manual therapy, and Re-evaluation  PLAN FOR NEXT SESSION: NuStep, lower extremity strengthening, and modalities as needed  Gadiel John,CHRIS, PTA 07/11/2022, 4:23 PM

## 2022-07-15 ENCOUNTER — Ambulatory Visit: Payer: 59 | Admitting: Physical Therapy

## 2022-07-15 ENCOUNTER — Encounter: Payer: Self-pay | Admitting: Physical Therapy

## 2022-07-15 DIAGNOSIS — G8929 Other chronic pain: Secondary | ICD-10-CM | POA: Diagnosis not present

## 2022-07-15 DIAGNOSIS — M170 Bilateral primary osteoarthritis of knee: Secondary | ICD-10-CM | POA: Diagnosis not present

## 2022-07-15 DIAGNOSIS — M25561 Pain in right knee: Secondary | ICD-10-CM | POA: Diagnosis not present

## 2022-07-15 DIAGNOSIS — M25562 Pain in left knee: Secondary | ICD-10-CM | POA: Diagnosis not present

## 2022-07-15 NOTE — Therapy (Signed)
OUTPATIENT PHYSICAL THERAPY LOWER EXTREMITY TREATMENT   Patient Name: Alison [name] MRN: [id number] DOB:07/18/1940, 82 y.o., female Today's Date: 07/15/2022  END OF SESSION:  PT End of Session - 07/15/22 1039     Visit Number 6    Number of Visits 12    Date for PT Re-Evaluation 08/12/22    PT Start Time 1016    PT Stop Time 1054    PT Time Calculation (min) 38 min    Activity Tolerance Patient tolerated treatment well    Behavior During Therapy WFL for tasks assessed/performed            Past Medical History:  Diagnosis Date   Anxiety    Arthritis    Breast cancer (HCC)    X2   Cervical dysplasia    GERD (gastroesophageal reflux disease)    takes otc meds   Glaucoma    Hyperlipidemia    Hypertension    Lichen sclerosus    Personal history of chemotherapy 2010   Personal history of radiation therapy 1999   Shingles 2012   Past Surgical History:  Procedure Laterality Date   BREAST BIOPSY Right 2010   BREAST LUMPECTOMY Right 2010   BREAST LUMPECTOMY Left    BREAST SURGERY     Lumpectomy right and left   GYNECOLOGIC CRYOSURGERY     HYSTEROSCOPY     PORT-A-CATH REMOVAL Left 01/31/2014   Procedure: REMOVAL PORT-A-CATH;  Surgeon: Chevis Pretty III, MD;  Location: [facility] OR;  Service: General;  Laterality: Left;   TUBAL LIGATION     Patient Active Problem List   Diagnosis Date Noted   Constipation 03/30/2021   Anxiety 08/14/2018   Left lumbar radiculopathy 06/16/2015   Trochanteric bursitis of left hip 06/04/2015   HX: breast cancer 01/01/2015   Depression, major, single episode, mild (HCC) 06/13/2013   Hyperlipidemia 06/13/2013   Hypertension with goal blood pressure less than 130/80 06/13/2013   Primary osteoarthritis of both knees 04/17/2013   Tendinitis, de Quervain's 01/20/2012   Lichen sclerosus    PCP: Junie Spencer, FNP  REFERRING PROVIDER: Monica Becton, MD   REFERRING DIAG: Primary osteoarthritis of both knees   THERAPY DIAG:  Chronic  pain of right knee  Chronic pain of left knee  Rationale for Evaluation and Treatment: Rehabilitation  ONSET DATE: "years ago"   SUBJECTIVE:   SUBJECTIVE STATEMENT:  Did okay after last Rx.  PERTINENT HISTORY: Hypertension, osteoarthritis, history of breast cancer, depression, and anxiety  PAIN:  Are you having pain? Yes: NPRS scale: 6/10 Pain location: bilateral knees Pain description: intermittent aching Aggravating factors: cooking, prolonged standing and walking (5-10 minutes)  Relieving factors: elevating her legs, injections  PRECAUTIONS: None  WEIGHT BEARING RESTRICTIONS: No  PATIENT GOALS: reduced pain and be able to stand longer  NEXT MD VISIT: none scheduled  OBJECTIVE:   PATIENT SURVEYS:  FOTO 50.96  EDEMA:  Mild left ankle edema observed  PALPATION: TTP:   Right lower extremity: hip adductors, medial and lateral joint line, hamstrings, and gastrocnemius/soleus  Left lower extremity: Iliotibial band and lateral joint line   LOWER EXTREMITY ROM:  Active ROM Right eval Left eval  Hip flexion    Hip extension    Hip abduction    Hip adduction    Hip internal rotation    Hip external rotation    Knee flexion 95; "tight"  118  Knee extension 4 0  Ankle dorsiflexion    Ankle plantarflexion    Ankle  inversion    Ankle eversion     (Blank rows = not tested)  LOWER EXTREMITY MMT:  MMT Right eval Left eval  Hip flexion 3+/5 4-/5  Hip extension    Hip abduction    Hip adduction    Hip internal rotation    Hip external rotation    Knee flexion 3/5; familiar pain 4-/5; slight pain  Knee extension 3+/5; familiar pain 4/5  Ankle dorsiflexion 4-/5 4-/5  Ankle plantarflexion    Ankle inversion    Ankle eversion     (Blank rows = not tested)  GAIT: Assistive device utilized: Single point cane Level of assistance: Modified independence Comments: Step through pattern with decreased stride length and gait speed  TODAY'S TREATMENT:                                                                                                                               DATE:                                                       07-15-22  EXERCISE LOG     B Knees  Exercise Repetitions and Resistance Comments  Nustep L4 x 15 min   Knee ext 10# x 3 minutes.   Ham curls 30# x 3 minutes   Airex balanace pad 4 minutes.   Rockerboard in parallel bars 3 minutes.   Seated hip abduction Red theraband x 3 minutes.                PATIENT EDUCATION:  Education details: Plan of care, healing, anatomy, arthritis, prognosis, benefits of therapy, and goals of therapy Person educated: Patient Education method: Explanation Education comprehension: verbalized understanding  HOME EXERCISE PROGRAM:  ASSESSMENT:  CLINICAL IMPRESSION:  Pain staying around a 6.  Added bilateral knee extension and ham curls with excellent technique.  OBJECTIVE IMPAIRMENTS: Abnormal gait, decreased activity tolerance, decreased mobility, difficulty walking, decreased ROM, decreased strength, hypomobility, increased edema, and pain.   ACTIVITY LIMITATIONS: carrying, lifting, standing, squatting, stairs, transfers, and locomotion level  PARTICIPATION LIMITATIONS: meal prep, cleaning, laundry, shopping, and community activity  PERSONAL FACTORS: Age, Past/current experiences, Time since onset of injury/illness/exacerbation, and 3+ comorbidities: Hypertension, osteoarthritis, history of breast cancer, depression, and anxiety  are also affecting patient's functional outcome.   REHAB POTENTIAL: Good  CLINICAL DECISION MAKING: Evolving/moderate complexity  EVALUATION COMPLEXITY: Moderate   GOALS: Goals reviewed with patient? Yes  SHORT TERM GOALS: Target date: 07/18/22 Patient will be independent with her initial HEP. Baseline: Goal status: MET  2.  Patient will be able to complete her daily activities without her familiar knee pain exceeding 6/10. Baseline:  Goal  status: ON going  3.  Patient will be able to demonstrate at least 105 degrees of active right  knee flexion for improved knee mobility. Baseline:  Goal status: On going  4.  Patient will be able to stand for at least 15 minutes for improved function cooking. Baseline:  Goal status: On going  LONG TERM GOALS: Target date: 08/08/22  Patient will be independent with her advanced HEP. Baseline:  Goal status: INITIAL  2.  Patient will be able to complete her daily activities without her familiar pain exceeding 4/10. Baseline:  Goal status: INITIAL  3.  Patient will be able to stand for at least 30 minutes without being limited by her familiar symptoms for improved function completing her household activities. Baseline:  Goal status: INITIAL  4.  Patient will be able to demonstrate at least 115 degrees of active right knee flexion for improved knee mobility. Baseline:  Goal status: INITIAL  PLAN:  PT FREQUENCY: 1-2x/week  PT DURATION: 6 weeks  PLANNED INTERVENTIONS: Therapeutic exercises, Therapeutic activity, Neuromuscular re-education, Balance training, Gait training, Patient/Family education, Self Care, Joint mobilization, Stair training, Electrical stimulation, Cryotherapy, Moist heat, Vasopneumatic device, Manual therapy, and Re-evaluation  PLAN FOR NEXT SESSION: NuStep, lower extremity strengthening, and modalities as needed  Doyt Castellana, [place], PT 07/15/2022, 11:04 AM

## 2022-07-18 ENCOUNTER — Ambulatory Visit: Payer: 59 | Admitting: Physical Therapy

## 2022-07-18 DIAGNOSIS — M25562 Pain in left knee: Secondary | ICD-10-CM | POA: Diagnosis not present

## 2022-07-18 DIAGNOSIS — M170 Bilateral primary osteoarthritis of knee: Secondary | ICD-10-CM | POA: Diagnosis not present

## 2022-07-18 DIAGNOSIS — G8929 Other chronic pain: Secondary | ICD-10-CM

## 2022-07-18 DIAGNOSIS — M25561 Pain in right knee: Secondary | ICD-10-CM | POA: Diagnosis not present

## 2022-07-18 NOTE — Therapy (Signed)
OUTPATIENT PHYSICAL THERAPY LOWER EXTREMITY TREATMENT   Patient Name: Alison [name] MRN: [id number] DOB:1940-07-09, 82 y.o., female Today's Date: 07/18/2022  END OF SESSION:  PT End of Session - 07/18/22 1528     Visit Number 7    Number of Visits 12    Date for PT Re-Evaluation 08/12/22    PT Start Time 0315    PT Stop Time 0351    PT Time Calculation (min) 36 min    Activity Tolerance Patient tolerated treatment well    Behavior During Therapy [facility] for tasks assessed/performed            Past Medical History:  Diagnosis Date   Anxiety    Arthritis    Breast cancer (HCC)    X2   Cervical dysplasia    GERD (gastroesophageal reflux disease)    takes otc meds   Glaucoma    Hyperlipidemia    Hypertension    Lichen sclerosus    Personal history of chemotherapy 2010   Personal history of radiation therapy 1999   Shingles 2012   Past Surgical History:  Procedure Laterality Date   BREAST BIOPSY Right 2010   BREAST LUMPECTOMY Right 2010   BREAST LUMPECTOMY Left    BREAST SURGERY     Lumpectomy right and left   GYNECOLOGIC CRYOSURGERY     HYSTEROSCOPY     PORT-A-CATH REMOVAL Left 01/31/2014   Procedure: REMOVAL PORT-A-CATH;  Surgeon: Chevis Pretty III, MD;  Location: [facility] OR;  Service: General;  Laterality: Left;   TUBAL LIGATION     Patient Active Problem List   Diagnosis Date Noted   Constipation 03/30/2021   Anxiety 08/14/2018   Left lumbar radiculopathy 06/16/2015   Trochanteric bursitis of left hip 06/04/2015   HX: breast cancer 01/01/2015   Depression, major, single episode, mild (HCC) 06/13/2013   Hyperlipidemia 06/13/2013   Hypertension with goal blood pressure less than 130/80 06/13/2013   Primary osteoarthritis of both knees 04/17/2013   Tendinitis, de Quervain's 01/20/2012   Lichen sclerosus    PCP: Junie Spencer, FNP  REFERRING PROVIDER: Monica Becton, MD   REFERRING DIAG: Primary osteoarthritis of both knees   THERAPY DIAG:  Chronic  pain of right knee  Chronic pain of left knee  Rationale for Evaluation and Treatment: Rehabilitation  ONSET DATE: "years ago"   SUBJECTIVE:   SUBJECTIVE STATEMENT: About the same.  PERTINENT HISTORY: Hypertension, osteoarthritis, history of breast cancer, depression, and anxiety  PAIN:  Are you having pain? Yes: NPRS scale: 6/10 Pain location: bilateral knees Pain description: intermittent aching Aggravating factors: cooking, prolonged standing and walking (5-10 minutes)  Relieving factors: elevating her legs, injections  PRECAUTIONS: None  WEIGHT BEARING RESTRICTIONS: No  PATIENT GOALS: reduced pain and be able to stand longer  NEXT MD VISIT: none scheduled  OBJECTIVE:   PATIENT SURVEYS:  FOTO 50.96  EDEMA:  Mild left ankle edema observed  PALPATION: TTP:   Right lower extremity: hip adductors, medial and lateral joint line, hamstrings, and gastrocnemius/soleus  Left lower extremity: Iliotibial band and lateral joint line   LOWER EXTREMITY ROM:  Active ROM Right eval Left eval  Hip flexion    Hip extension    Hip abduction    Hip adduction    Hip internal rotation    Hip external rotation    Knee flexion 95; "tight"  118  Knee extension 4 0  Ankle dorsiflexion    Ankle plantarflexion    Ankle inversion  Ankle eversion     (Blank rows = not tested)  LOWER EXTREMITY MMT:  MMT Right eval Left eval  Hip flexion 3+/5 4-/5  Hip extension    Hip abduction    Hip adduction    Hip internal rotation    Hip external rotation    Knee flexion 3/5; familiar pain 4-/5; slight pain  Knee extension 3+/5; familiar pain 4/5  Ankle dorsiflexion 4-/5 4-/5  Ankle plantarflexion    Ankle inversion    Ankle eversion     (Blank rows = not tested)  GAIT: Assistive device utilized: Single point cane Level of assistance: Modified independence Comments: Step through pattern with decreased stride length and gait speed  TODAY'S TREATMENT:                                                                                                                               DATE:                                                       07-18-22  EXERCISE LOG     B Knees  Exercise Repetitions and Resistance Comments  Nustep L4 x 15 min   Knee ext 10# x 3 minutes.   Ham curls 30# x 3 minutes   8 inch box lunges 3 minutes   Rockerboard in parallel bars 3 minutes.   Seated hip abduction Red theraband x 3 minutes.                PATIENT EDUCATION:  Education details: Plan of care, healing, anatomy, arthritis, prognosis, benefits of therapy, and goals of therapy Person educated: Patient Education method: Explanation Education comprehension: verbalized understanding  HOME EXERCISE PROGRAM:  ASSESSMENT:  CLINICAL IMPRESSION:  Pain staying around a 6.  Patient did well with treatment today and knee extension performed within a range of motion that did not increase pain.  OBJECTIVE IMPAIRMENTS: Abnormal gait, decreased activity tolerance, decreased mobility, difficulty walking, decreased ROM, decreased strength, hypomobility, increased edema, and pain.   ACTIVITY LIMITATIONS: carrying, lifting, standing, squatting, stairs, transfers, and locomotion level  PARTICIPATION LIMITATIONS: meal prep, cleaning, laundry, shopping, and community activity  PERSONAL FACTORS: Age, Past/current experiences, Time since onset of injury/illness/exacerbation, and 3+ comorbidities: Hypertension, osteoarthritis, history of breast cancer, depression, and anxiety  are also affecting patient's functional outcome.   REHAB POTENTIAL: Good  CLINICAL DECISION MAKING: Evolving/moderate complexity  EVALUATION COMPLEXITY: Moderate   GOALS: Goals reviewed with patient? Yes  SHORT TERM GOALS: Target date: 07/18/22 Patient will be independent with her initial HEP. Baseline: Goal status: MET  2.  Patient will be able to complete her daily activities without her familiar knee pain  exceeding 6/10. Baseline:  Goal status: ON going  3.  Patient will be able to demonstrate  at least 105 degrees of active right knee flexion for improved knee mobility. Baseline:  Goal status: On going  4.  Patient will be able to stand for at least 15 minutes for improved function cooking. Baseline:  Goal status: On going  LONG TERM GOALS: Target date: 08/08/22  Patient will be independent with her advanced HEP. Baseline:  Goal status: INITIAL  2.  Patient will be able to complete her daily activities without her familiar pain exceeding 4/10. Baseline:  Goal status: INITIAL  3.  Patient will be able to stand for at least 30 minutes without being limited by her familiar symptoms for improved function completing her household activities. Baseline:  Goal status: INITIAL  4.  Patient will be able to demonstrate at least 115 degrees of active right knee flexion for improved knee mobility. Baseline:  Goal status: INITIAL  PLAN:  PT FREQUENCY: 1-2x/week  PT DURATION: 6 weeks  PLANNED INTERVENTIONS: Therapeutic exercises, Therapeutic activity, Neuromuscular re-education, Balance training, Gait training, Patient/Family education, Self Care, Joint mobilization, Stair training, Electrical stimulation, Cryotherapy, Moist heat, Vasopneumatic device, Manual therapy, and Re-evaluation  PLAN FOR NEXT SESSION: NuStep, lower extremity strengthening, and modalities as needed  Dayle Sherpa, [place], PT 07/18/2022, 4:07 PM

## 2022-07-21 ENCOUNTER — Encounter: Payer: Self-pay | Admitting: *Deleted

## 2022-07-21 ENCOUNTER — Ambulatory Visit: Payer: 59 | Admitting: *Deleted

## 2022-07-21 DIAGNOSIS — G8929 Other chronic pain: Secondary | ICD-10-CM | POA: Diagnosis not present

## 2022-07-21 DIAGNOSIS — M25562 Pain in left knee: Secondary | ICD-10-CM | POA: Diagnosis not present

## 2022-07-21 DIAGNOSIS — M170 Bilateral primary osteoarthritis of knee: Secondary | ICD-10-CM | POA: Diagnosis not present

## 2022-07-21 DIAGNOSIS — M25561 Pain in right knee: Secondary | ICD-10-CM | POA: Diagnosis not present

## 2022-07-21 NOTE — Therapy (Signed)
OUTPATIENT PHYSICAL THERAPY LOWER EXTREMITY TREATMENT   Patient Name: ERRIKA NARVAIZ MRN: [id number] DOB:1940-05-12, 82 y.o., female Today's Date: 07/21/2022  END OF SESSION:  PT End of Session - 07/21/22 1545     Visit Number 8    Number of Visits 12    Date for PT Re-Evaluation 08/12/22    PT Start Time 1515    PT Stop Time 1603    PT Time Calculation (min) 48 min            Past Medical History:  Diagnosis Date   Anxiety    Arthritis    Breast cancer (HCC)    X2   Cervical dysplasia    GERD (gastroesophageal reflux disease)    takes otc meds   Glaucoma    Hyperlipidemia    Hypertension    Lichen sclerosus    Personal history of chemotherapy 2010   Personal history of radiation therapy 1999   Shingles 2012   Past Surgical History:  Procedure Laterality Date   BREAST BIOPSY Right 2010   BREAST LUMPECTOMY Right 2010   BREAST LUMPECTOMY Left    BREAST SURGERY     Lumpectomy right and left   GYNECOLOGIC CRYOSURGERY     HYSTEROSCOPY     PORT-A-CATH REMOVAL Left 01/31/2014   Procedure: REMOVAL PORT-A-CATH;  Surgeon: Chevis Pretty III, MD;  Location: [facility] OR;  Service: General;  Laterality: Left;   TUBAL LIGATION     Patient Active Problem List   Diagnosis Date Noted   Constipation 03/30/2021   Anxiety 08/14/2018   Left lumbar radiculopathy 06/16/2015   Trochanteric bursitis of left hip 06/04/2015   HX: breast cancer 01/01/2015   Depression, major, single episode, mild (HCC) 06/13/2013   Hyperlipidemia 06/13/2013   Hypertension with goal blood pressure less than 130/80 06/13/2013   Primary osteoarthritis of both knees 04/17/2013   Tendinitis, de Quervain's 01/20/2012   Lichen sclerosus    PCP: Junie Spencer, FNP  REFERRING PROVIDER: Monica Becton, MD   REFERRING DIAG: Primary osteoarthritis of both knees   THERAPY DIAG:  Chronic pain of right knee  Chronic pain of left knee  Rationale for Evaluation and Treatment: Rehabilitation  ONSET  DATE: "years ago"   SUBJECTIVE:   SUBJECTIVE STATEMENT: Did good after last visit. Knee pain is less today  PERTINENT HISTORY: Hypertension, osteoarthritis, history of breast cancer, depression, and anxiety  PAIN:  Are you having pain? Yes: NPRS scale: 4-5/10 Pain location: bilateral knees Pain description: intermittent aching Aggravating factors: cooking, prolonged standing and walking (5-10 minutes)  Relieving factors: elevating her legs, injections  PRECAUTIONS: None  WEIGHT BEARING RESTRICTIONS: No  PATIENT GOALS: reduced pain and be able to stand longer  NEXT MD VISIT: none scheduled  OBJECTIVE:   PATIENT SURVEYS:  FOTO 50.96  EDEMA:  Mild left ankle edema observed  PALPATION: TTP:   Right lower extremity: hip adductors, medial and lateral joint line, hamstrings, and gastrocnemius/soleus  Left lower extremity: Iliotibial band and lateral joint line   LOWER EXTREMITY ROM:  Active ROM Right eval Left eval  Hip flexion    Hip extension    Hip abduction    Hip adduction    Hip internal rotation    Hip external rotation    Knee flexion 95; "tight"  118  Knee extension 4 0  Ankle dorsiflexion    Ankle plantarflexion    Ankle inversion    Ankle eversion     (Blank rows = not tested)  LOWER EXTREMITY MMT:  MMT Right eval Left eval  Hip flexion 3+/5 4-/5  Hip extension    Hip abduction    Hip adduction    Hip internal rotation    Hip external rotation    Knee flexion 3/5; familiar pain 4-/5; slight pain  Knee extension 3+/5; familiar pain 4/5  Ankle dorsiflexion 4-/5 4-/5  Ankle plantarflexion    Ankle inversion    Ankle eversion     (Blank rows = not tested)  GAIT: Assistive device utilized: Single point cane Level of assistance: Modified independence Comments: Step through pattern with decreased stride length and gait speed  TODAY'S TREATMENT:                                                                                                                               DATE:                                                       07-21-22  EXERCISE LOG     B Knees  Exercise Repetitions and Resistance Comments  Nustep L4 x 15 min   Knee ext 10# x 3 minutes.   Ham curls 30# x 3 minutes   8 inch box lunges 3 minutes   Rockerboard in parallel bars 5 minutes.   DF/PF balance   Seated hip abduction Red theraband  3x10   6 in step up X 10 bil   Balance beam X        PATIENT EDUCATION:  Education details: Plan of care, healing, anatomy, arthritis, prognosis, benefits of therapy, and goals of therapy Person educated: Patient Education method: Explanation Education comprehension: verbalized understanding  HOME EXERCISE PROGRAM:  ASSESSMENT:  CLINICAL IMPRESSION:  Pt reports decreased knee Pain today 4-5/10.  Patient  was able to keep with LE strengthening  progressions today and did great with mainly fatigue and knee pain staying about the same. RT knee ROM today was 100 degrees. STG  partially met  OBJECTIVE IMPAIRMENTS: Abnormal gait, decreased activity tolerance, decreased mobility, difficulty walking, decreased ROM, decreased strength, hypomobility, increased edema, and pain.   ACTIVITY LIMITATIONS: carrying, lifting, standing, squatting, stairs, transfers, and locomotion level  PARTICIPATION LIMITATIONS: meal prep, cleaning, laundry, shopping, and community activity  PERSONAL FACTORS: Age, Past/current experiences, Time since onset of injury/illness/exacerbation, and 3+ comorbidities: Hypertension, osteoarthritis, history of breast cancer, depression, and anxiety  are also affecting patient's functional outcome.   REHAB POTENTIAL: Good  CLINICAL DECISION MAKING: Evolving/moderate complexity  EVALUATION COMPLEXITY: Moderate   GOALS: Goals reviewed with patient? Yes  SHORT TERM GOALS: Target date: 07/18/22 Patient will be independent with her initial HEP. Baseline: Goal status: MET  2.  Patient will be able  to complete her daily activities without her familiar knee pain exceeding  6/10. Baseline:  Goal status: ON going  3.  Patient will be able to demonstrate at least 105 degrees of active right knee flexion for improved knee mobility. Baseline:  Goal status: On going  4.  Patient will be able to stand for at least 15 minutes for improved function cooking. Baseline:  Goal status: On going  LONG TERM GOALS: Target date: 08/08/22  Patient will be independent with her advanced HEP. Baseline:  Goal status: INITIAL  2.  Patient will be able to complete her daily activities without her familiar pain exceeding 4/10. Baseline:  Goal status: INITIAL  3.  Patient will be able to stand for at least 30 minutes without being limited by her familiar symptoms for improved function completing her household activities. Baseline:  Goal status: INITIAL  4.  Patient will be able to demonstrate at least 115 degrees of active right knee flexion for improved knee mobility. Baseline:  Goal status: INITIAL  PLAN:  PT FREQUENCY: 1-2x/week  PT DURATION: 6 weeks  PLANNED INTERVENTIONS: Therapeutic exercises, Therapeutic activity, Neuromuscular re-education, Balance training, Gait training, Patient/Family education, Self Care, Joint mobilization, Stair training, Electrical stimulation, Cryotherapy, Moist heat, Vasopneumatic device, Manual therapy, and Re-evaluation  PLAN FOR NEXT SESSION: NuStep, lower extremity strengthening, and modalities as needed  Elaine Roanhorse,CHR[place], PTA 07/21/2022, 5:58 PM

## 2022-08-01 ENCOUNTER — Ambulatory Visit: Payer: 59 | Attending: Sports Medicine | Admitting: Physical Therapy

## 2022-08-01 ENCOUNTER — Encounter: Payer: Self-pay | Admitting: Physical Therapy

## 2022-08-01 DIAGNOSIS — M25561 Pain in right knee: Secondary | ICD-10-CM | POA: Diagnosis present

## 2022-08-01 DIAGNOSIS — G8929 Other chronic pain: Secondary | ICD-10-CM | POA: Diagnosis present

## 2022-08-01 DIAGNOSIS — M25562 Pain in left knee: Secondary | ICD-10-CM | POA: Insufficient documentation

## 2022-08-01 NOTE — Therapy (Addendum)
OUTPATIENT PHYSICAL THERAPY LOWER EXTREMITY TREATMENT   Patient Name: BRIGGETTE KATT MRN: [id number] DOB:25-Jun-1940, 82 y.o., female Today's Date: 08/01/2022  END OF SESSION:  PT End of Session - 08/01/22 1109     Visit Number 9    Number of Visits 12    Date for PT Re-Evaluation 08/12/22    PT Start Time 1102    PT Stop Time 1145    PT Time Calculation (min) 43 min    Equipment Utilized During Treatment Other (comment)   SPC   Activity Tolerance Patient tolerated treatment well    Behavior During Therapy WFL for tasks assessed/performed            Past Medical History:  Diagnosis Date   Anxiety    Arthritis    Breast cancer (HCC)    X2   Cervical dysplasia    GERD (gastroesophageal reflux disease)    takes otc meds   Glaucoma    Hyperlipidemia    Hypertension    Lichen sclerosus    Personal history of chemotherapy 2010   Personal history of radiation therapy 1999   Shingles 2012   Past Surgical History:  Procedure Laterality Date   BREAST BIOPSY Right 2010   BREAST LUMPECTOMY Right 2010   BREAST LUMPECTOMY Left    BREAST SURGERY     Lumpectomy right and left   GYNECOLOGIC CRYOSURGERY     HYSTEROSCOPY     PORT-A-CATH REMOVAL Left 01/31/2014   Procedure: REMOVAL PORT-A-CATH;  Surgeon: Chevis Pretty III, MD;  Location: [facility] OR;  Service: General;  Laterality: Left;   TUBAL LIGATION     Patient Active Problem List   Diagnosis Date Noted   Constipation 03/30/2021   Anxiety 08/14/2018   Left lumbar radiculopathy 06/16/2015   Trochanteric bursitis of left hip 06/04/2015   HX: breast cancer 01/01/2015   Depression, major, single episode, mild (HCC) 06/13/2013   Hyperlipidemia 06/13/2013   Hypertension with goal blood pressure less than 130/80 06/13/2013   Primary osteoarthritis of both knees 04/17/2013   Tendinitis, de Quervain's 01/20/2012   Lichen sclerosus    PCP: Junie Spencer, FNP  REFERRING PROVIDER: Monica Becton, MD   REFERRING DIAG:  Primary osteoarthritis of both knees   THERAPY DIAG:  Chronic pain of right knee  Chronic pain of left knee  Rationale for Evaluation and Treatment: Rehabilitation  ONSET DATE: "years ago"   SUBJECTIVE:   SUBJECTIVE STATEMENT: Reports being very active this weekend so she feels stiff today.  PERTINENT HISTORY: Hypertension, osteoarthritis, history of breast cancer, depression, and anxiety  PAIN:  Are you having pain? Yes: NPRS scale: 8/10 Pain location: bilateral knees Pain description: ache, stiff Aggravating factors: cooking, prolonged standing and walking (5-10 minutes)  Relieving factors: elevating her legs, injections  PRECAUTIONS: None  WEIGHT BEARING RESTRICTIONS: No  PATIENT GOALS: reduced pain and be able to stand longer  NEXT MD VISIT: none scheduled  OBJECTIVE:   PATIENT SURVEYS:  FOTO 50.96  EDEMA:  Mild left ankle edema observed  PALPATION: TTP:   Right lower extremity: hip adductors, medial and lateral joint line, hamstrings, and gastrocnemius/soleus  Left lower extremity: Iliotibial band and lateral joint line   LOWER EXTREMITY ROM:  Active ROM Right eval Left eval  Hip flexion    Hip extension    Hip abduction    Hip adduction    Hip internal rotation    Hip external rotation    Knee flexion 95; "tight"  118  Knee extension 4 0  Ankle dorsiflexion    Ankle plantarflexion    Ankle inversion    Ankle eversion     (Blank rows = not tested)  LOWER EXTREMITY MMT:  MMT Right eval Left eval  Hip flexion 3+/5 4-/5  Hip extension    Hip abduction    Hip adduction    Hip internal rotation    Hip external rotation    Knee flexion 3/5; familiar pain 4-/5; slight pain  Knee extension 3+/5; familiar pain 4/5  Ankle dorsiflexion 4-/5 4-/5  Ankle plantarflexion    Ankle inversion    Ankle eversion     (Blank rows = not tested)  GAIT: Assistive device utilized: Single point cane Level of assistance: Modified independence Comments:  Step through pattern with decreased stride length and gait speed  TODAY'S TREATMENT:                                                                                                                              DATE:   08/01/22  EXERCISE LOG     B Knees  Exercise Repetitions and Resistance Comments  Nustep L4 x 16 min   Heel raises X20 reps   Toe raises X20 reps   Knee ext 10# x 3 minutes.   Ham curls 30# x 3 minutes   Hip abduction X15 reps each standing  Sit to stands X4 reps Painful especially with R knee   PATIENT EDUCATION:  Education details: Plan of care, healing, anatomy, arthritis, prognosis, benefits of therapy, and goals of therapy Person educated: Patient Education method: Explanation Education comprehension: verbalized understanding  HOME EXERCISE PROGRAM:  ASSESSMENT:  CLINICAL IMPRESSION:  Patient presented in clinic with reports of higher knee discomfort as she reported being active yesterday. Patient guided through therex for LE strengthening with greatest fatigue with knee extensor on machine. Sit to stands are still very difficult per patient report due to pain especially in R knee and has to use some UE support. Patient also observed extending R knee minimally prior to sitting due to pain. Patient does report feeling LE strength improving. Patient reported mild increase in knee edema during therex.  OBJECTIVE IMPAIRMENTS: Abnormal gait, decreased activity tolerance, decreased mobility, difficulty walking, decreased ROM, decreased strength, hypomobility, increased edema, and pain.   ACTIVITY LIMITATIONS: carrying, lifting, standing, squatting, stairs, transfers, and locomotion level  PARTICIPATION LIMITATIONS: meal prep, cleaning, laundry, shopping, and community activity  PERSONAL FACTORS: Age, Past/current experiences, Time since onset of injury/illness/exacerbation, and 3+ comorbidities: Hypertension, osteoarthritis, history of breast cancer, depression, and  anxiety  are also affecting patient's functional outcome.   REHAB POTENTIAL: Good  CLINICAL DECISION MAKING: Evolving/moderate complexity  EVALUATION COMPLEXITY: Moderate   GOALS: Goals reviewed with patient? Yes  SHORT TERM GOALS: Target date: 07/18/22 Patient will be independent with her initial HEP. Baseline: Goal status: MET  2.  Patient will be able to complete her daily activities without her familiar knee  pain exceeding 6/10. Baseline:  Goal status: ON going  3.  Patient will be able to demonstrate at least 105 degrees of active right knee flexion for improved knee mobility. Baseline:  Goal status: On going  4.  Patient will be able to stand for at least 15 minutes for improved function cooking. Baseline:  Goal status: On going  LONG TERM GOALS: Target date: 08/08/22  Patient will be independent with her advanced HEP. Baseline:  Goal status: IN PROGRESS  2.  Patient will be able to complete her daily activities without her familiar pain exceeding 4/10. Baseline:  Goal status: IN PROGRESS  3.  Patient will be able to stand for at least 30 minutes without being limited by her familiar symptoms for improved function completing her household activities. Baseline:  Goal status: IN PROGRESS  4.  Patient will be able to demonstrate at least 115 degrees of active right knee flexion for improved knee mobility. Baseline:  Goal status: IN PROGRESS  PLAN:  PT FREQUENCY: 1-2x/week  PT DURATION: 6 weeks  PLANNED INTERVENTIONS: Therapeutic exercises, Therapeutic activity, Neuromuscular re-education, Balance training, Gait training, Patient/Family education, Self Care, Joint mobilization, Stair training, Electrical stimulation, Cryotherapy, Moist heat, Vasopneumatic device, Manual therapy, and Re-evaluation  PLAN FOR NEXT SESSION: NuStep, lower extremity strengthening, and modalities as needed  Marvell Fuller, PTA 08/01/2022, 11:54 AM

## 2022-08-04 ENCOUNTER — Ambulatory Visit
Admission: RE | Admit: 2022-08-04 | Discharge: 2022-08-04 | Disposition: A | Discharge status: Home / Self Care | Payer: 59 | Source: Ambulatory Visit | Attending: Hematology | Admitting: Hematology

## 2022-08-04 DIAGNOSIS — Z1231 Encounter for screening mammogram for malignant neoplasm of breast: Secondary | ICD-10-CM

## 2022-08-05 ENCOUNTER — Ambulatory Visit: Payer: 59 | Admitting: Physical Therapy

## 2022-08-05 ENCOUNTER — Encounter: Payer: Self-pay | Admitting: Physical Therapy

## 2022-08-05 DIAGNOSIS — M25561 Pain in right knee: Secondary | ICD-10-CM | POA: Diagnosis not present

## 2022-08-05 DIAGNOSIS — G8929 Other chronic pain: Secondary | ICD-10-CM

## 2022-08-05 DIAGNOSIS — M25562 Pain in left knee: Secondary | ICD-10-CM | POA: Diagnosis not present

## 2022-08-05 NOTE — Therapy (Signed)
OUTPATIENT PHYSICAL THERAPY LOWER EXTREMITY TREATMENT   Patient Name: Alison [name] MRN: [id number] DOB:1940-12-22, 82 y.o., female Today's Date: 08/05/2022  END OF SESSION:  PT End of Session - 08/05/22 1031     Visit Number 10    Number of Visits 12    Date for PT Re-Evaluation 08/12/22    PT Start Time 1016    PT Stop Time 1057    PT Time Calculation (min) 41 min    Equipment Utilized During Treatment Other (comment)   SPC   Activity Tolerance Patient tolerated treatment well    Behavior During Therapy WFL for tasks assessed/performed            Past Medical History:  Diagnosis Date   Anxiety    Arthritis    Breast cancer (HCC)    X2   Cervical dysplasia    GERD (gastroesophageal reflux disease)    takes otc meds   Glaucoma    Hyperlipidemia    Hypertension    Lichen sclerosus    Personal history of chemotherapy 2010   Personal history of radiation therapy 1999   Shingles 2012   Past Surgical History:  Procedure Laterality Date   BREAST BIOPSY Right 2010   BREAST LUMPECTOMY Right 2010   BREAST LUMPECTOMY Left    BREAST SURGERY     Lumpectomy right and left   GYNECOLOGIC CRYOSURGERY     HYSTEROSCOPY     PORT-A-CATH REMOVAL Left 01/31/2014   Procedure: REMOVAL PORT-A-CATH;  Surgeon: Chevis Pretty III, MD;  Location: [facility] OR;  Service: General;  Laterality: Left;   TUBAL LIGATION     Patient Active Problem List   Diagnosis Date Noted   Constipation 03/30/2021   Anxiety 08/14/2018   Left lumbar radiculopathy 06/16/2015   Trochanteric bursitis of left hip 06/04/2015   HX: breast cancer 01/01/2015   Depression, major, single episode, mild (HCC) 06/13/2013   Hyperlipidemia 06/13/2013   Hypertension with goal blood pressure less than 130/80 06/13/2013   Primary osteoarthritis of both knees 04/17/2013   Tendinitis, de Quervain's 01/20/2012   Lichen sclerosus    PCP: Junie Spencer, FNP  REFERRING PROVIDER: Monica Becton, MD   REFERRING DIAG:  Primary osteoarthritis of both knees   THERAPY DIAG:  Chronic pain of right knee  Chronic pain of left knee  Rationale for Evaluation and Treatment: Rehabilitation  ONSET DATE: "years ago"   SUBJECTIVE:   SUBJECTIVE STATEMENT: Reporting more L knee pain today.  PERTINENT HISTORY: Hypertension, osteoarthritis, history of breast cancer, depression, and anxiety  PAIN:  Are you having pain? Yes: NPRS scale: 5/10 Pain location: bilateral knees Pain description: ache, stiff Aggravating factors: cooking, prolonged standing and walking (5-10 minutes)  Relieving factors: elevating her legs, injections  PRECAUTIONS: None  WEIGHT BEARING RESTRICTIONS: No  PATIENT GOALS: reduced pain and be able to stand longer  NEXT MD VISIT: none scheduled  OBJECTIVE:   PATIENT SURVEYS:  FOTO 50.96  EDEMA:  Mild left ankle edema observed  PALPATION: TTP:   Right lower extremity: hip adductors, medial and lateral joint line, hamstrings, and gastrocnemius/soleus  Left lower extremity: Iliotibial band and lateral joint line   LOWER EXTREMITY ROM:  Active ROM Right eval Left eval  Hip flexion    Hip extension    Hip abduction    Hip adduction    Hip internal rotation    Hip external rotation    Knee flexion 95; "tight"  118  Knee extension 4 0  Ankle dorsiflexion    Ankle plantarflexion    Ankle inversion    Ankle eversion     (Blank rows = not tested)  LOWER EXTREMITY MMT:  MMT Right eval Left eval  Hip flexion 3+/5 4-/5  Hip extension    Hip abduction    Hip adduction    Hip internal rotation    Hip external rotation    Knee flexion 3/5; familiar pain 4-/5; slight pain  Knee extension 3+/5; familiar pain 4/5  Ankle dorsiflexion 4-/5 4-/5  Ankle plantarflexion    Ankle inversion    Ankle eversion     (Blank rows = not tested)  GAIT: Assistive device utilized: Single point cane Level of assistance: Modified independence Comments: Step through pattern with  decreased stride length and gait speed  TODAY'S TREATMENT:                                                                                                                              DATE:   08/05/22  EXERCISE LOG     B Knees  Exercise Repetitions and Resistance Comments  Nustep L4 x 15 min   Heel raises X20 reps   Toe raises X20 reps   Knee ext 10# x 3 minutes.   Ham curls 30# x 3 minutes   Hip abduction X15 reps each standing  Forward step ups 6" step x10 reps each LE UE support to assist   PATIENT EDUCATION:  Education details: Plan of care, healing, anatomy, arthritis, prognosis, benefits of therapy, and goals of therapy Person educated: Patient Education method: Explanation Education comprehension: verbalized understanding  HOME EXERCISE PROGRAM:  ASSESSMENT:  CLINICAL IMPRESSION:  Patient presented in clinic with reports of increased L knee pain today. Patient states that she has been trying to get up from her chairs at home without UE support but unsuccessful. Patient progressed to more strengthening but slower with forward step ups due to pain and increased UE support. Patient fatigued with therex today.  OBJECTIVE IMPAIRMENTS: Abnormal gait, decreased activity tolerance, decreased mobility, difficulty walking, decreased ROM, decreased strength, hypomobility, increased edema, and pain.   ACTIVITY LIMITATIONS: carrying, lifting, standing, squatting, stairs, transfers, and locomotion level  PARTICIPATION LIMITATIONS: meal prep, cleaning, laundry, shopping, and community activity  PERSONAL FACTORS: Age, Past/current experiences, Time since onset of injury/illness/exacerbation, and 3+ comorbidities: Hypertension, osteoarthritis, history of breast cancer, depression, and anxiety  are also affecting patient's functional outcome.   REHAB POTENTIAL: Good  CLINICAL DECISION MAKING: Evolving/moderate complexity  EVALUATION COMPLEXITY: Moderate   GOALS: Goals reviewed with  patient? Yes  SHORT TERM GOALS: Target date: 07/18/22 Patient will be independent with her initial HEP. Baseline: Goal status: MET  2.  Patient will be able to complete her daily activities without her familiar knee pain exceeding 6/10. Baseline:  Goal status: ON going  3.  Patient will be able to demonstrate at least 105 degrees of active right knee flexion for improved knee mobility.  Baseline:  Goal status: On going  4.  Patient will be able to stand for at least 15 minutes for improved function cooking. Baseline:  Goal status: On going  LONG TERM GOALS: Target date: 08/08/22  Patient will be independent with her advanced HEP. Baseline:  Goal status: IN PROGRESS  2.  Patient will be able to complete her daily activities without her familiar pain exceeding 4/10. Baseline:  Goal status: IN PROGRESS  3.  Patient will be able to stand for at least 30 minutes without being limited by her familiar symptoms for improved function completing her household activities. Baseline:  Goal status: IN PROGRESS  4.  Patient will be able to demonstrate at least 115 degrees of active right knee flexion for improved knee mobility. Baseline:  Goal status: IN PROGRESS  PLAN:  PT FREQUENCY: 1-2x/week  PT DURATION: 6 weeks  PLANNED INTERVENTIONS: Therapeutic exercises, Therapeutic activity, Neuromuscular re-education, Balance training, Gait training, Patient/Family education, Self Care, Joint mobilization, Stair training, Electrical stimulation, Cryotherapy, Moist heat, Vasopneumatic device, Manual therapy, and Re-evaluation  PLAN FOR NEXT SESSION: NuStep, lower extremity strengthening, and modalities as needed  Marvell Fuller, PTA 08/05/2022, 12:32 PM

## 2022-08-10 ENCOUNTER — Other Ambulatory Visit: Payer: Self-pay | Admitting: Family Medicine

## 2022-08-10 ENCOUNTER — Encounter: Payer: 59 | Admitting: Physical Therapy

## 2022-08-10 DIAGNOSIS — Z1211 Encounter for screening for malignant neoplasm of colon: Secondary | ICD-10-CM

## 2022-08-16 ENCOUNTER — Telehealth: Payer: 59

## 2022-08-16 ENCOUNTER — Telehealth: Payer: 59 | Admitting: Nurse Practitioner

## 2022-08-16 DIAGNOSIS — J0140 Acute pansinusitis, unspecified: Secondary | ICD-10-CM | POA: Diagnosis not present

## 2022-08-16 DIAGNOSIS — R051 Acute cough: Secondary | ICD-10-CM | POA: Diagnosis not present

## 2022-08-16 MED ORDER — BENZONATATE 100 MG PO CAPS
100.0000 mg | ORAL_CAPSULE | Freq: Three times a day (TID) | ORAL | 0 refills | Status: DC | PRN
Start: 2022-08-16 — End: 2023-04-17

## 2022-08-16 MED ORDER — AMOXICILLIN-POT CLAVULANATE 875-125 MG PO TABS
1.0000 | ORAL_TABLET | Freq: Two times a day (BID) | ORAL | 0 refills | Status: AC
Start: 2022-08-16 — End: 2022-08-23

## 2022-08-16 NOTE — Progress Notes (Signed)
Virtual Visit Consent   Alison [name], you are scheduled for a virtual visit with a [facility] provider today. Just as with appointments in the office, your consent must be obtained to participate. Your consent will be active for this visit and any virtual visit you may have with one of our providers in the next 365 days. If you have a MyChart account, a copy of this consent can be sent to you electronically.  As this is a virtual visit, video technology does not allow for your provider to perform a traditional examination. This may limit your provider's ability to fully assess your condition. If your provider identifies any concerns that need to be evaluated in person or the need to arrange testing (such as labs, EKG, etc.), we will make arrangements to do so. Although advances in technology are sophisticated, we cannot ensure that it will always work on either your end or our end. If the connection with a video visit is poor, the visit may have to be switched to a telephone visit. With either a video or telephone visit, we are not always able to ensure that we have a secure connection.  By engaging in this virtual visit, you consent to the provision of healthcare and authorize for your insurance to be billed (if applicable) for the services provided during this visit. Depending on your insurance coverage, you may receive a charge related to this service.  I need to obtain your verbal consent now. Are you willing to proceed with your visit today? Alison [name] has provided verbal consent on 08/16/2022 for a virtual visit (video or telephone). Viviano Simas, FNP  Date: 08/16/2022 12:22 PM  Virtual Visit via Video Note   I, Viviano Simas, connected with  Alison [name]  ([id number], [birth year]) on 08/16/22 at 12:30 PM EDT by a video-enabled telemedicine application and verified that I am speaking with the correct person using two identifiers.  Location: Patient: Virtual Visit Location Patient:  Home Provider: Virtual Visit Location Provider: Home Office   I discussed the limitations of evaluation and management by telemedicine and the availability of in person appointments. The patient expressed understanding and agreed to proceed.    History of Present Illness: Alison [name] is a 82 y.o. who identifies as a female who was assigned female at birth, and is being seen today for nasal congestion and a sore throat.    She has a cough and nasal congestion  Difficulty sleeping   COVID test at home was negative   Denies fever  She has a productive cough   She has had pneumonia in the past about 2 years ago   She was having chills initially   Denies SOB or wheezing   Problems:  Patient Active Problem List   Diagnosis Date Noted   Constipation 03/30/2021   Anxiety 08/14/2018   Left lumbar radiculopathy 06/16/2015   Trochanteric bursitis of left hip 06/04/2015   HX: breast cancer 01/01/2015   Depression, major, single episode, mild (HCC) 06/13/2013   Hyperlipidemia 06/13/2013   Hypertension with goal blood pressure less than 130/80 06/13/2013   Primary osteoarthritis of both knees 04/17/2013   Tendinitis, de Quervain's 01/20/2012   Lichen sclerosus     Allergies: No Known Allergies Medications:  Current Outpatient Medications:    acetaminophen (TYLENOL) 650 MG CR tablet, Take 1 tablet (650 mg total) by mouth in the morning and at bedtime., Disp: 90 tablet, Rfl: 3   aspirin 81 MG tablet, Take 81  mg by mouth daily., Disp: , Rfl:    Brinzolamide-Brimonidine 1-0.2 % SUSP, Apply to eye., Disp: , Rfl:    Calcium Carbonate-Vitamin D (CALCIUM + D PO), Take 2 capsules by mouth daily. , Disp: , Rfl:    escitalopram (LEXAPRO) 10 MG tablet, TAKE 1 TABLET BY MOUTH EVERY DAY, Disp: 90 tablet, Rfl: 0   estradiol (ESTRACE) 0.1 MG/GM vaginal cream, PLACE 1 APPLICATORFUL VAGINALLY 2 (TWO) TIMES A WEEK., Disp: 42.5 g, Rfl: 0   fluconazole (DIFLUCAN) 150 MG tablet, Take 1 tablet (150 mg  total) by mouth every three (3) days as needed., Disp: 3 tablet, Rfl: 0   fluticasone (FLONASE) 50 MCG/ACT nasal spray, SPRAY 2 SPRAYS INTO EACH NOSTRIL EVERY DAY, Disp: 48 mL, Rfl: 3   KLOR-CON M20 20 MEQ tablet, TAKE 1 TABLET BY MOUTH TWICE A DAY, Disp: 180 tablet, Rfl: 1   latanoprost (XALATAN) 0.005 % ophthalmic solution, 1 drop at bedtime., Disp: , Rfl:    levocetirizine (XYZAL) 5 MG tablet, TAKE 1/2 TABLET (2.5 MG TOTAL) BY MOUTH EVERY EVENING., Disp: 45 tablet, Rfl: 1   losartan-hydrochlorothiazide (HYZAAR) 100-25 MG tablet, TAKE 1 TABLET BY MOUTH EVERY DAY, Disp: 90 tablet, Rfl: 0   meloxicam (MOBIC) 7.5 MG tablet, TAKE 1 TABLET BY MOUTH EVERY DAY, Disp: 90 tablet, Rfl: 0   metoprolol succinate (TOPROL-XL) 25 MG 24 hr tablet, TAKE 0.5 TABLETS (12.5 MG TOTAL) BY MOUTH IN THE MORNING AND AT BEDTIME., Disp: 90 tablet, Rfl: 1   Omega-3 Fatty Acids (FISH OIL PO), Take 1 capsule by mouth daily. , Disp: , Rfl:    rosuvastatin (CRESTOR) 10 MG tablet, TAKE 1 TABLET BY MOUTH EVERY DAY, Disp: 90 tablet, Rfl: 0  Observations/Objective: Patient is well-developed, well-nourished in no acute distress.  Resting comfortably  at home.  Head is normocephalic, atraumatic.  No labored breathing.  Speech is clear and coherent with logical content.  Patient is alert and oriented at baseline.    Assessment and Plan: 1. Acute non-recurrent pansinusitis  - amoxicillin-clavulanate (AUGMENTIN) 875-125 MG tablet; Take 1 tablet by mouth 2 (two) times daily for 7 days.  Dispense: 14 tablet; Refill: 0  2. Acute cough Mucinex for productive cough if cough becomes dry may switch to:  - benzonatate (TESSALON) 100 MG capsule; Take 1 capsule (100 mg total) by mouth 3 (three) times daily as needed.  Dispense: 30 capsule; Refill: 0        Follow Up Instructions: I discussed the assessment and treatment plan with the patient. The patient was provided an opportunity to ask questions and all were answered. The  patient agreed with the plan and demonstrated an understanding of the instructions.  A copy of instructions were sent to the patient via MyChart unless otherwise noted below.    The patient was advised to call back or seek an in-person evaluation if the symptoms worsen or if the condition fails to improve as anticipated.  Time:  I spent 15 minutes with the patient via telehealth technology discussing the above problems/concerns.    Viviano Simas, FNP

## 2022-08-21 ENCOUNTER — Other Ambulatory Visit: Payer: Self-pay | Admitting: Family

## 2022-08-21 DIAGNOSIS — F419 Anxiety disorder, unspecified: Secondary | ICD-10-CM

## 2022-08-21 DIAGNOSIS — F320 Major depressive disorder, single episode, mild: Secondary | ICD-10-CM

## 2022-08-21 DIAGNOSIS — E782 Mixed hyperlipidemia: Secondary | ICD-10-CM

## 2022-08-21 DIAGNOSIS — I10 Essential (primary) hypertension: Secondary | ICD-10-CM

## 2022-09-07 DIAGNOSIS — Z961 Presence of intraocular lens: Secondary | ICD-10-CM | POA: Diagnosis not present

## 2022-09-07 DIAGNOSIS — H401131 Primary open-angle glaucoma, bilateral, mild stage: Secondary | ICD-10-CM | POA: Diagnosis not present

## 2022-09-19 ENCOUNTER — Other Ambulatory Visit: Payer: Self-pay | Admitting: Family

## 2022-09-27 ENCOUNTER — Ambulatory Visit (INDEPENDENT_AMBULATORY_CARE_PROVIDER_SITE_OTHER): Payer: 59

## 2022-09-27 ENCOUNTER — Encounter: Payer: Self-pay | Admitting: Family

## 2022-09-27 ENCOUNTER — Ambulatory Visit (INDEPENDENT_AMBULATORY_CARE_PROVIDER_SITE_OTHER): Payer: 59 | Admitting: Family

## 2022-09-27 ENCOUNTER — Other Ambulatory Visit: Payer: Self-pay | Admitting: Family

## 2022-09-27 VITALS — BP 132/78 | HR 68 | Temp 97.7°F | Ht 64.0 in | Wt 192.4 lb

## 2022-09-27 DIAGNOSIS — I10 Essential (primary) hypertension: Secondary | ICD-10-CM | POA: Diagnosis not present

## 2022-09-27 DIAGNOSIS — E782 Mixed hyperlipidemia: Secondary | ICD-10-CM

## 2022-09-27 DIAGNOSIS — M170 Bilateral primary osteoarthritis of knee: Secondary | ICD-10-CM

## 2022-09-27 DIAGNOSIS — Z780 Asymptomatic menopausal state: Secondary | ICD-10-CM | POA: Diagnosis not present

## 2022-09-27 DIAGNOSIS — F320 Major depressive disorder, single episode, mild: Secondary | ICD-10-CM

## 2022-09-27 DIAGNOSIS — F419 Anxiety disorder, unspecified: Secondary | ICD-10-CM

## 2022-09-27 DIAGNOSIS — K5900 Constipation, unspecified: Secondary | ICD-10-CM

## 2022-09-27 DIAGNOSIS — Z853 Personal history of malignant neoplasm of breast: Secondary | ICD-10-CM

## 2022-09-27 MED ORDER — ESCITALOPRAM OXALATE 10 MG PO TABS
10.0000 mg | ORAL_TABLET | Freq: Every day | ORAL | 1 refills | Status: DC
Start: 2022-09-27 — End: 2023-05-17

## 2022-09-27 NOTE — Patient Instructions (Signed)
Health Maintenance After Age 81 After age 81, you are at a higher risk for certain long-term diseases and infections as well as injuries from falls. Falls are a major cause of broken bones and head injuries in people who are older than age 81. Getting regular preventive care can help to keep you healthy and well. Preventive care includes getting regular testing and making lifestyle changes as recommended by your health care provider. Talk with your health care provider about: Which screenings and tests you should have. A screening is a test that checks for a disease when you have no symptoms. A diet and exercise plan that is right for you. What should I know about screenings and tests to prevent falls? Screening and testing are the best ways to find a health problem early. Early diagnosis and treatment give you the best chance of managing medical conditions that are common after age 81. Certain conditions and lifestyle choices may make you more likely to have a fall. Your health care provider may recommend: Regular vision checks. Poor vision and conditions such as cataracts can make you more likely to have a fall. If you wear glasses, make sure to get your prescription updated if your vision changes. Medicine review. Work with your health care provider to regularly review all of the medicines you are taking, including over-the-counter medicines. Ask your health care provider about any side effects that may make you more likely to have a fall. Tell your health care provider if any medicines that you take make you feel dizzy or sleepy. Strength and balance checks. Your health care provider may recommend certain tests to check your strength and balance while standing, walking, or changing positions. Foot health exam. Foot pain and numbness, as well as not wearing proper footwear, can make you more likely to have a fall. Screenings, including: Osteoporosis screening. Osteoporosis is a condition that causes  the bones to get weaker and break more easily. Blood pressure screening. Blood pressure changes and medicines to control blood pressure can make you feel dizzy. Depression screening. You may be more likely to have a fall if you have a fear of falling, feel depressed, or feel unable to do activities that you used to do. Alcohol use screening. Using too much alcohol can affect your balance and may make you more likely to have a fall. Follow these instructions at home: Lifestyle Do not drink alcohol if: Your health care provider tells you not to drink. If you drink alcohol: Limit how much you have to: 0-1 drink a day for women. 0-2 drinks a day for men. Know how much alcohol is in your drink. In the [place] one drink equals one 12 oz bottle of beer (355 mL), one 5 oz glass of wine (148 mL), or one 1 oz glass of hard liquor (44 mL). Do not use any products that contain nicotine or tobacco. These products include cigarettes, chewing tobacco, and vaping devices, such as e-cigarettes. If you need help quitting, ask your health care provider. Activity  Follow a regular exercise program to stay fit. This will help you maintain your balance. Ask your health care provider what types of exercise are appropriate for you. If you need a cane or walker, use it as recommended by your health care provider. Wear supportive shoes that have nonskid soles. Safety  Remove any tripping hazards, such as rugs, cords, and clutter. Install safety equipment such as grab bars in bathrooms and safety rails on stairs. Keep rooms and walkways   well-lit. General instructions Talk with your health care provider about your risks for falling. Tell your health care provider if: You fall. Be sure to tell your health care provider about all falls, even ones that seem minor. You feel dizzy, tiredness (fatigue), or off-balance. Take over-the-counter and prescription medicines only as told by your health care provider. These include  supplements. Eat a healthy diet and maintain a healthy weight. A healthy diet includes low-fat dairy products, low-fat (lean) meats, and fiber from whole grains, beans, and lots of fruits and vegetables. Stay current with your vaccines. Schedule regular health, dental, and eye exams. Summary Having a healthy lifestyle and getting preventive care can help to protect your health and wellness after age 81. Screening and testing are the best way to find a health problem early and help you avoid having a fall. Early diagnosis and treatment give you the best chance for managing medical conditions that are more common for people who are older than age 81. Falls are a major cause of broken bones and head injuries in people who are older than age 81. Take precautions to prevent a fall at home. Work with your health care provider to learn what changes you can make to improve your health and wellness and to prevent falls. This information is not intended to replace advice given to you by your health care provider. Make sure you discuss any questions you have with your health care provider. Document Revised: 06/01/2020 Document Reviewed: 06/01/2020 [employer] Patient Education  2024 [employer]  

## 2022-09-27 NOTE — Progress Notes (Signed)
Subjective:    Patient ID: Alison [name], female    DOB: 1941-01-03, 82 y.o.   MRN: [id number]  Chief Complaint  Patient presents with   Medical Management of Chronic Issues   PT presents to the office today for chronic follow up. She is followed by Oncologists for Left breast cancer in 1998 and right breast cancer 2010.     She is followed by Ortho for bilateral knee pain and getting gel injections. She completed PT.  Hypertension The current episode started more than 1 year ago. The problem has been resolved since onset. The problem is controlled. Associated symptoms include anxiety, malaise/fatigue ("Some times") and peripheral edema. Pertinent negatives include no shortness of breath. Risk factors for coronary artery disease include dyslipidemia, diabetes mellitus, obesity and sedentary lifestyle. The current treatment provides moderate improvement.  Back Pain This is a chronic problem. The current episode started more than 1 year ago. The problem occurs intermittently. The problem has been waxing and waning since onset. The pain is present in the lumbar spine. The quality of the pain is described as aching. The pain is at a severity of 3/10. The pain is mild.  Arthritis Presents for follow-up visit. She complains of pain and stiffness. Affected locations include the left knee and right knee. Her pain is at a severity of 6/10.  Hyperlipidemia This is a chronic problem. The current episode started more than 1 year ago. The problem is controlled. Recent lipid tests were reviewed and are normal. Exacerbating diseases include obesity. Pertinent negatives include no shortness of breath. Current antihyperlipidemic treatment includes statins. The current treatment provides moderate improvement of lipids. Risk factors for coronary artery disease include dyslipidemia, hypertension, a sedentary lifestyle and post-menopausal.  Depression        This is a chronic problem.  The current episode started  more than 1 year ago.   The problem occurs intermittently.  Associated symptoms include sad.  Associated symptoms include no helplessness and no hopelessness.  Past treatments include SSRIs - Selective serotonin reuptake inhibitors.  Past medical history includes anxiety.   Anxiety Presents for follow-up visit. Symptoms include excessive worry and nervous/anxious behavior. Patient reports no shortness of breath. Symptoms occur occasionally. The severity of symptoms is mild.    Constipation This is a chronic problem. The current episode started more than 1 year ago. The problem has been waxing and waning since onset. Her stool frequency is 2 to 3 times per week. Associated symptoms include back pain. Risk factors include obesity. She has tried fiber for the symptoms. The treatment provided moderate relief.      Review of Systems  Constitutional:  Positive for malaise/fatigue ("Some times").  Respiratory:  Negative for shortness of breath.   Gastrointestinal:  Positive for constipation.  Musculoskeletal:  Positive for arthritis, back pain and stiffness.  Psychiatric/Behavioral:  Positive for depression. The patient is nervous/anxious.   All other systems reviewed and are negative.      Objective:   Physical Exam Vitals reviewed.  Constitutional:      General: She is not in acute distress.    Appearance: She is well-developed. She is obese.  HENT:     Head: Normocephalic and atraumatic.     Right Ear: Tympanic membrane normal.     Left Ear: Tympanic membrane normal.  Eyes:     Pupils: Pupils are equal, round, and reactive to light.  Neck:     Thyroid: No thyromegaly.  Cardiovascular:     Rate  and Rhythm: Normal rate and regular rhythm.     Heart sounds: Normal heart sounds. No murmur heard. Pulmonary:     Effort: Pulmonary effort is normal. No respiratory distress.     Breath sounds: Normal breath sounds. No wheezing.  Abdominal:     General: Bowel sounds are normal. There is  no distension.     Palpations: Abdomen is soft.     Tenderness: There is no abdominal tenderness.  Musculoskeletal:        General: No tenderness.     Cervical back: Normal range of motion and neck supple.     Right lower leg: Edema present.     Left lower leg: Edema present.     Comments: Pain in bilateral knees with flexion and extension  Skin:    General: Skin is warm and dry.  Neurological:     Mental Status: She is alert and oriented to person, place, and time.     Cranial Nerves: No cranial nerve deficit.     Deep Tendon Reflexes: Reflexes are normal and symmetric.  Psychiatric:        Behavior: Behavior normal.        Thought Content: Thought content normal.        Judgment: Judgment normal.       BP 132/78   Pulse 68   Temp 97.7 F (36.5 C) (Temporal)   Ht 5\' 4"  (1.626 m)   Wt 192 lb 6.4 oz (87.3 kg)   LMP 01/25/2004   SpO2 98%   BMI 33.03 kg/m      Assessment & Plan:  Alison [name] comes in today with chief complaint of Medical Management of Chronic Issues   Diagnosis and orders addressed:  1. Anxiety - escitalopram (LEXAPRO) 10 MG tablet; Take 1 tablet (10 mg total) by mouth daily.  Dispense: 90 tablet; Refill: 1 - CMP14+EGFR  2. Depression, major, single episode, mild (HCC) - escitalopram (LEXAPRO) 10 MG tablet; Take 1 tablet (10 mg total) by mouth daily.  Dispense: 90 tablet; Refill: 1 - CMP14+EGFR  3. Primary osteoarthritis of both knees  - CMP14+EGFR  4. Hypertension with goal blood pressure less than 130/80 - CMP14+EGFR  5. Moderate mixed hyperlipidemia not requiring statin therapy - CMP14+EGFR  6. HX: breast cancer - CMP14+EGFR  7. Constipation, unspecified constipation type - CMP14+EGFR   Labs pending Continue medications  Health Maintenance reviewed Diet and exercise encouraged  Follow up plan: 6 months    Alison Rodney, FNP

## 2022-09-28 LAB — CMP14+EGFR
ALT: 20 IU/L (ref 0–32)
AST: 19 IU/L (ref 0–40)
Albumin: 4.3 g/dL (ref 3.7–4.7)
Alkaline Phosphatase: 71 IU/L (ref 44–121)
BUN/Creatinine Ratio: 20 (ref 12–28)
BUN: 18 mg/dL (ref 8–27)
Bilirubin Total: 1.3 mg/dL — ABNORMAL HIGH (ref 0.0–1.2)
CO2: 25 mmol/L (ref 20–29)
Calcium: 9.4 mg/dL (ref 8.7–10.3)
Chloride: 102 mmol/L (ref 96–106)
Creatinine, Ser: 0.89 mg/dL (ref 0.57–1.00)
Globulin, Total: 2.3 g/dL (ref 1.5–4.5)
Glucose: 86 mg/dL (ref 70–99)
Potassium: 3.8 mmol/L (ref 3.5–5.2)
Sodium: 141 mmol/L (ref 134–144)
Total Protein: 6.6 g/dL (ref 6.0–8.5)
eGFR: 65 mL/min/{1.73_m2} (ref >59)

## 2022-10-26 ENCOUNTER — Other Ambulatory Visit: Payer: Self-pay | Admitting: Family

## 2022-10-29 ENCOUNTER — Other Ambulatory Visit: Payer: Self-pay | Admitting: Family

## 2022-10-30 ENCOUNTER — Other Ambulatory Visit: Payer: Self-pay | Admitting: Family

## 2022-10-30 DIAGNOSIS — I10 Essential (primary) hypertension: Secondary | ICD-10-CM

## 2022-10-31 NOTE — Telephone Encounter (Signed)
Name from pharmacy: MELOXICAM 7.5 MG TABLET  Pharmacy comment: Alternative Requested:PATIENT STATES SHE WAS TOLD TO TAKE 1 TABLET TWICE DAILIY WHEN NEEDED AND IS RUNNING OUT OF MEDS. PLEASE RESEND RX FOR 1 TABLET TWICE DAILY PRN IF APPROPRIATE.

## 2022-11-04 ENCOUNTER — Telehealth: Payer: Self-pay | Admitting: Family

## 2022-11-04 ENCOUNTER — Ambulatory Visit: Payer: 59

## 2022-11-04 VITALS — Ht 64.0 in | Wt 196.0 lb

## 2022-11-04 DIAGNOSIS — Z0000 Encounter for general adult medical examination without abnormal findings: Secondary | ICD-10-CM

## 2022-11-04 MED ORDER — MELOXICAM 7.5 MG PO TABS: 7.5000 mg | ORAL_TABLET | Freq: Two times a day (BID) | ORAL | 1 refills | Status: DC

## 2022-11-04 NOTE — Telephone Encounter (Signed)
Pt aware and verbalized understanding.  

## 2022-11-04 NOTE — Patient Instructions (Signed)
Ms. Gadsby , Thank you for taking time to come for your Medicare Wellness Visit. I appreciate your ongoing commitment to your health goals. Please review the following plan we discussed and let me know if I can assist you in the future.   Referrals/Orders/Follow-Ups/Clinician Recommendations: Aim for 30 minutes of exercise or brisk walking, 6-8 glasses of water, and 5 servings of fruits and vegetables each day.   This is a list of the screening recommended for you and due dates:  Health Maintenance  Topic Date Due   COVID-19 Vaccine (4 - 2023-24 season) 09/25/2022   Flu Shot  04/24/2023*   Colon Cancer Screening  09/27/2023*   Medicare Annual Wellness Visit  11/04/2023   DTaP/Tdap/Td vaccine (2 - Td or Tdap) 05/20/2029   Pneumonia Vaccine  Completed   DEXA scan (bone density measurement)  Completed   Zoster (Shingles) Vaccine  Completed   HPV Vaccine  Aged Out   Hepatitis C Screening  Discontinued  *Topic was postponed. The date shown is not the original due date.    Advanced directives: (Copy Requested) Please bring a copy of your health care power of attorney and living will to the office to be added to your chart at your convenience.  Next Medicare Annual Wellness Visit scheduled for next year: Yes  Insert Preventive Care attachment Insert FALL PREVENTION attachment if needed

## 2022-11-04 NOTE — Telephone Encounter (Signed)
New prescription sent to pharmacy 

## 2022-11-04 NOTE — Progress Notes (Deleted)
Subjective:   Alison [name] is a 82 y.o. female who presents for Medicare Annual (Subsequent) preventive examination.  Visit Complete: Virtual I connected with  [name] on 11/04/22 by a audio enabled telemedicine application and verified that I am speaking with the correct person using two identifiers.  Patient Location: Home  Provider Location: Home Office  I discussed the limitations of evaluation and management by telemedicine. The patient expressed understanding and agreed to proceed.  Vital Signs: Because this visit was a virtual/telehealth visit, some criteria may be missing or patient reported. Any vitals not documented were not able to be obtained and vitals that have been documented are patient reported.  Patient Medicare AWV questionnaire was completed by the patient on 11/04/2022; I have confirmed that all information answered by patient is correct and no changes since this date.        Objective:    Today's Vitals   11/04/22 1035  Weight: 196 lb (88.9 kg)  Height: 5\' 4"  (1.626 m)   Body mass index is 33.64 kg/m.     06/27/2022    4:12 PM 09/21/2021   11:13 AM 09/18/2020    1:06 PM 10/03/2018   10:32 AM 12/30/2016   10:11 AM 01/01/2016    9:35 AM 07/09/2015    2:52 PM  Advanced Directives  Does Patient Have a Medical Advance Directive? No No No No No No No  Would patient like information on creating a medical advance directive?  No - Patient declined No - Patient declined No - Patient declined   No - patient declined information    Current Medications (verified) Outpatient Encounter Medications as of 11/04/2022  Medication Sig   acetaminophen (TYLENOL) 650 MG CR tablet Take 1 tablet (650 mg total) by mouth in the morning and at bedtime.   aspirin 81 MG tablet Take 81 mg by mouth daily.   benzonatate (TESSALON) 100 MG capsule Take 1 capsule (100 mg total) by mouth 3 (three) times daily as needed.   Brinzolamide-Brimonidine 1-0.2 % SUSP Apply to eye.    Calcium Carbonate-Vitamin D (CALCIUM + D PO) Take 2 capsules by mouth daily.    escitalopram (LEXAPRO) 10 MG tablet Take 1 tablet (10 mg total) by mouth daily.   estradiol (ESTRACE) 0.1 MG/GM vaginal cream PLACE 1 APPLICATORFUL VAGINALLY 2 (TWO) TIMES A WEEK.   fluticasone (FLONASE) 50 MCG/ACT nasal spray SPRAY 2 SPRAYS INTO EACH NOSTRIL EVERY DAY   KLOR-CON M20 20 MEQ tablet TAKE 1 TABLET BY MOUTH TWICE A DAY   latanoprost (XALATAN) 0.005 % ophthalmic solution 1 drop at bedtime.   levocetirizine (XYZAL) 5 MG tablet TAKE 1/2 TABLET (2.5 MG TOTAL) BY MOUTH EVERY EVENING.   losartan-hydrochlorothiazide (HYZAAR) 100-25 MG tablet TAKE 1 TABLET BY MOUTH EVERY DAY   meloxicam (MOBIC) 7.5 MG tablet TAKE 1 TABLET BY MOUTH EVERY DAY   metoprolol succinate (TOPROL-XL) 25 MG 24 hr tablet TAKE 0.5 TABLETS (12.5 MG TOTAL) BY MOUTH IN THE MORNING AND AT BEDTIME.   Omega-3 Fatty Acids (FISH OIL PO) Take 1 capsule by mouth daily.   rosuvastatin (CRESTOR) 10 MG tablet TAKE 1 TABLET BY MOUTH EVERY DAY   No facility-administered encounter medications on file as of 11/04/2022.    Allergies (verified) Patient has no known allergies.   History: Past Medical History:  Diagnosis Date   Anxiety    Arthritis    Breast cancer (HCC)    X2   Cervical dysplasia    GERD (gastroesophageal  reflux disease)    takes otc meds   Glaucoma    Hyperlipidemia    Hypertension    Lichen sclerosus    Personal history of chemotherapy 2010   Personal history of radiation therapy 1999   Shingles 2012   Past Surgical History:  Procedure Laterality Date   BREAST BIOPSY Right 2010   BREAST LUMPECTOMY Right 2010   BREAST LUMPECTOMY Left    BREAST SURGERY     Lumpectomy right and left   GYNECOLOGIC CRYOSURGERY     HYSTEROSCOPY     PORT-A-CATH REMOVAL Left 01/31/2014   Procedure: REMOVAL PORT-A-CATH;  Surgeon: Chevis Pretty III, MD;  Location: [facility] OR;  Service: General;  Laterality: Left;   TUBAL LIGATION     Family History   Problem Relation Age of Onset   Hypertension Mother    Cancer Mother        colon   Colon cancer Mother    Diabetes Sister    Heart disease Father    Breast cancer Maternal Aunt 66   Breast cancer Maternal Aunt    Social History   Socioeconomic History   Marital status: Divorced    Spouse name: Not on file   Number of children: 3   Years of education: 10   Highest education level: 10th grade  Occupational History   Occupation: Retired  Tobacco Use   Smoking status: Never   Smokeless tobacco: Never  Vaping Use   Vaping status: Never Used  Substance and Sexual Activity   Alcohol use: Yes    Comment: Rare   Drug use: No   Sexual activity: Not Currently    Birth control/protection: Post-menopausal    Comment: 1st intercourse 82 yo-Fewer than 5 partners  Other Topics Concern   Not on file  Social History Narrative   Lives alone. Children live in [place], [place] and [address]   Social Determinants of Health   Financial Resource Strain: Low Risk  (11/04/2022)   Overall Financial Resource Strain (CARDIA)    Difficulty of Paying Living Expenses: Not hard at all  Food Insecurity: No Food Insecurity (11/04/2022)   Hunger Vital Sign    Worried About Running Out of Food in the Last Year: Never true    Ran Out of Food in the Last Year: Never true  Transportation Needs: No Transportation Needs (11/04/2022)   PRAPARE - Administrator, Civil Service (Medical): No    Lack of Transportation (Non-Medical): No  Physical Activity: Insufficiently Active (11/04/2022)   Exercise Vital Sign    Days of Exercise per Week: 3 days    Minutes of Exercise per Session: 30 min  Stress: No Stress Concern Present (11/04/2022)   [employer] of Occupational Health - Occupational Stress Questionnaire    Feeling of Stress : Not at all  Social Connections: Moderately Integrated (11/04/2022)   Social Connection and Isolation Panel [NHANES]    Frequency of Communication with  Friends and Family: More than three times a week    Frequency of Social Gatherings with Friends and Family: More than three times a week    Attends Religious Services: More than 4 times per year    Active Member of [employer] or Organizations: Yes    Attends Banker Meetings: 1 to 4 times per year    Marital Status: Divorced    Tobacco Counseling Counseling given: Not Answered   Clinical Intake:  Pre-visit preparation completed: Yes  Pain : No/denies pain  Nutritional Risks: None Diabetes: No  How often do you need to have someone help you when you read instructions, pamphlets, or other written materials from your doctor or pharmacy?: 1 - Never  Interpreter Needed?: No  Information entered by :: Renie Ora, LPN   Activities of Daily Living     No data to display          Patient Care Team: Junie Spencer, FNP as PCP - General (Family Medicine) Reece Packer, MD (Hematology and Oncology)  Indicate any recent Medical Services you may have received from other than [facility] providers in the past year (date may be approximate).     Assessment:   This is a routine wellness examination for Alison [name].  Hearing/Vision screen Vision Screening - Comments:: Wears rx glasses - up to date with routine eye exams with  Dr.Johnson    Goals Addressed   None    Depression Screen    11/04/2022   10:37 AM 09/27/2022   10:45 AM 03/25/2022   11:35 AM 09/21/2021   11:17 AM 07/08/2021   10:03 AM 07/02/2021   11:30 AM 03/30/2021   10:54 AM  PHQ 2/9 Scores  PHQ - 2 Score 0 1 2 0 1 4 0  PHQ- 9 Score 0 3 3  3 5 2     Fall Risk    11/04/2022   10:36 AM 03/25/2022   11:35 AM 09/21/2021   11:17 AM 09/07/2021   11:54 AM 07/08/2021   10:03 AM  Fall Risk   Falls in the past year? 0 0 0 0 0  Number falls in past yr: 0      Injury with Fall? 0      Risk for fall due to : No Fall Risks      Follow up Falls prevention discussed  Falls evaluation completed      MEDICARE RISK  AT HOME: Medicare Risk at Home Any stairs in or around the home?: No If so, are there any without handrails?: No Home free of loose throw rugs in walkways, pet beds, electrical cords, etc?: Yes Adequate lighting in your home to reduce risk of falls?: Yes Life alert?: No Use of a cane, walker or w/c?: No Grab bars in the bathroom?: Yes Shower chair or bench in shower?: Yes Elevated toilet seat or a handicapped toilet?: Yes  TIMED UP AND GO:  Was the test performed?  No    Cognitive Function:        09/21/2021   11:14 AM 10/03/2018   10:37 AM  6CIT Screen  What Year? 0 points 0 points  What month? 0 points 0 points  What time? 0 points 0 points  Count back from 20 2 points 0 points  Months in reverse 0 points 0 points  Repeat phrase 2 points 0 points  Total Score 4 points 0 points    Immunizations Immunization History  Administered Date(s) Administered   Fluad Quad(high Dose 65+) 11/12/2018, 11/21/2019, 03/25/2022   Influenza Split 01/20/2018   Influenza, High Dose Seasonal PF 01/07/2016   Influenza, Seasonal, Injecte, Preservative Fre 12/03/2012, 01/01/2015   Influenza,inj,Quad PF,6+ Mos 12/03/2012, 01/01/2015   Influenza,trivalent, recombinat, inj, PF 11/11/2013, 10/25/2016, 01/20/2018   Influenza-Unspecified 12/03/2012, 11/11/2013, 01/01/2015, 10/25/2016, 11/24/2020   Moderna Sars-Covid-2 Vaccination 02/04/2019, 03/07/2019, 11/28/2019   Pneumococcal Conjugate-13 02/17/2016   Pneumococcal Polysaccharide-23 05/31/2017   Tdap 05/21/2019   Zoster Recombinant(Shingrix) 03/30/2021, 07/02/2021    TDAP status: Up to date  Flu Vaccine status: Up to  date  Pneumococcal vaccine status: Up to date  Covid-19 vaccine status: Completed vaccines  Qualifies for Shingles Vaccine? Yes   Zostavax completed Yes   Shingrix Completed?: Yes  Screening Tests Health Maintenance  Topic Date Due   COVID-19 Vaccine (4 - 2023-24 season) 09/25/2022   INFLUENZA VACCINE  04/24/2023  (Originally 08/25/2022)   Colonoscopy  09/27/2023 (Originally 08/10/2022)   Medicare Annual Wellness (AWV)  11/04/2023   DTaP/Tdap/Td (2 - Td or Tdap) 05/20/2029   Pneumonia Vaccine 5+ Years old  Completed   DEXA SCAN  Completed   Zoster Vaccines- Shingrix  Completed   HPV VACCINES  Aged Out   Hepatitis C Screening  Discontinued    Health Maintenance  Health Maintenance Due  Topic Date Due   COVID-19 Vaccine (4 - 2023-24 season) 09/25/2022    Colorectal cancer screening: No longer required.   Mammogram status: No longer required due to age.  Bone Density status: Completed 09/27/2022. Results reflect: Bone density results: OSTEOPOROSIS. Repeat every 2 years.  Lung Cancer Screening: (Low Dose CT Chest recommended if Age 42-80 years, 20 pack-year currently smoking OR have quit w/in 15years.) does not qualify.   Lung Cancer Screening Referral: n/a  Additional Screening:  Hepatitis C Screening: does not qualify;   Vision Screening: Recommended annual ophthalmology exams for early detection of glaucoma and other disorders of the eye. Is the patient up to date with their annual eye exam?  Yes  Who is the provider or what is the name of the office in which the patient attends annual eye exams? Dr.Johnson  If pt is not established with a provider, would they like to be referred to a provider to establish care? No .   Dental Screening: Recommended annual dental exams for proper oral hygiene   Community Resource Referral / Chronic Care Management: CRR required this visit?  No   CCM required this visit?  No     Plan:     I have personally reviewed and noted the following in the patient's chart:   Medical and social history Use of alcohol, tobacco or illicit drugs  Current medications and supplements including opioid prescriptions. Patient is not currently taking opioid prescriptions. Functional ability and status Nutritional status Physical activity Advanced  directives List of other physicians Hospitalizations, surgeries, and ER visits in previous 12 months Vitals Screenings to include cognitive, depression, and falls Referrals and appointments  In addition, I have reviewed and discussed with patient certain preventive protocols, quality metrics, and best practice recommendations. A written personalized care plan for preventive services as well as general preventive health recommendations were provided to patient.     Lorrene Reid, LPN   [id number]   After Visit Summary: (MyChart) Due to this being a telephonic visit, the after visit summary with patients personalized plan was offered to patient via MyChart   Nurse Notes: none

## 2022-11-07 NOTE — Progress Notes (Signed)
Subjective:   Alison [name] is a 82 y.o. female who presents for Medicare Annual (Subsequent) preventive examination.  Visit Complete: Virtual I connected with  [name] on 11/07/22 by a audio enabled telemedicine application and verified that I am speaking with the correct person using two identifiers.  Patient Location: Home  Provider Location: Home Office  I discussed the limitations of evaluation and management by telemedicine. The patient expressed understanding and agreed to proceed.  Vital Signs: Because this visit was a virtual/telehealth visit, some criteria may be missing or patient reported. Any vitals not documented were not able to be obtained and vitals that have been documented are patient reported.  Patient Medicare AWV questionnaire was completed by the patient on 11/04/2022; I have confirmed that all information answered by patient is correct and no changes since this date.  Cardiac Risk Factors include: advanced age (>46men, >39 women);hypertension;dyslipidemia     Objective:    Today's Vitals   11/04/22 1035  Weight: 196 lb (88.9 kg)  Height: 5\' 4"  (1.626 m)   Body mass index is 33.64 kg/m.     11/07/2022   11:57 AM 11/04/2022   11:57 AM 06/27/2022    4:12 PM 09/21/2021   11:13 AM 09/18/2020    1:06 PM 10/03/2018   10:32 AM 12/30/2016   10:11 AM  Advanced Directives  Does Patient Have a Medical Advance Directive? Yes Yes No No No No No  Type of Estate agent of [place];Living will Healthcare Power of [place];Living will       Copy of Healthcare Power of Attorney in Chart? No - copy requested No - copy requested       Would patient like information on creating a medical advance directive?    No - Patient declined No - Patient declined No - Patient declined     Current Medications (verified) Outpatient Encounter Medications as of 11/04/2022  Medication Sig   acetaminophen (TYLENOL) 650 MG CR tablet Take 1 tablet (650 mg  total) by mouth in the morning and at bedtime.   aspirin 81 MG tablet Take 81 mg by mouth daily.   benzonatate (TESSALON) 100 MG capsule Take 1 capsule (100 mg total) by mouth 3 (three) times daily as needed.   Brinzolamide-Brimonidine 1-0.2 % SUSP Apply to eye.   Calcium Carbonate-Vitamin D (CALCIUM + D PO) Take 2 capsules by mouth daily.    escitalopram (LEXAPRO) 10 MG tablet Take 1 tablet (10 mg total) by mouth daily.   estradiol (ESTRACE) 0.1 MG/GM vaginal cream PLACE 1 APPLICATORFUL VAGINALLY 2 (TWO) TIMES A WEEK.   fluticasone (FLONASE) 50 MCG/ACT nasal spray SPRAY 2 SPRAYS INTO EACH NOSTRIL EVERY DAY   KLOR-CON M20 20 MEQ tablet TAKE 1 TABLET BY MOUTH TWICE A DAY   latanoprost (XALATAN) 0.005 % ophthalmic solution 1 drop at bedtime.   levocetirizine (XYZAL) 5 MG tablet TAKE 1/2 TABLET (2.5 MG TOTAL) BY MOUTH EVERY EVENING.   losartan-hydrochlorothiazide (HYZAAR) 100-25 MG tablet TAKE 1 TABLET BY MOUTH EVERY DAY   metoprolol succinate (TOPROL-XL) 25 MG 24 hr tablet TAKE 0.5 TABLETS (12.5 MG TOTAL) BY MOUTH IN THE MORNING AND AT BEDTIME.   Omega-3 Fatty Acids (FISH OIL PO) Take 1 capsule by mouth daily.   rosuvastatin (CRESTOR) 10 MG tablet TAKE 1 TABLET BY MOUTH EVERY DAY   [DISCONTINUED] meloxicam (MOBIC) 7.5 MG tablet TAKE 1 TABLET BY MOUTH EVERY DAY   No facility-administered encounter medications on file as of 11/04/2022.  Allergies (verified) Patient has no known allergies.   History: Past Medical History:  Diagnosis Date   Anxiety    Arthritis    Breast cancer (HCC)    X2   Cervical dysplasia    GERD (gastroesophageal reflux disease)    takes otc meds   Glaucoma    Hyperlipidemia    Hypertension    Lichen sclerosus    Personal history of chemotherapy 2010   Personal history of radiation therapy 1999   Shingles 2012   Past Surgical History:  Procedure Laterality Date   BREAST BIOPSY Right 2010   BREAST LUMPECTOMY Right 2010   BREAST LUMPECTOMY Left    BREAST  SURGERY     Lumpectomy right and left   GYNECOLOGIC CRYOSURGERY     HYSTEROSCOPY     PORT-A-CATH REMOVAL Left 01/31/2014   Procedure: REMOVAL PORT-A-CATH;  Surgeon: Chevis Pretty III, MD;  Location: [facility] OR;  Service: General;  Laterality: Left;   TUBAL LIGATION     Family History  Problem Relation Age of Onset   Hypertension Mother    Cancer Mother        colon   Colon cancer Mother    Diabetes Sister    Heart disease Father    Breast cancer Maternal Aunt 69   Breast cancer Maternal Aunt    Social History   Socioeconomic History   Marital status: Divorced    Spouse name: Not on file   Number of children: 3   Years of education: 10   Highest education level: 10th grade  Occupational History   Occupation: Retired  Tobacco Use   Smoking status: Never   Smokeless tobacco: Never  Vaping Use   Vaping status: Never Used  Substance and Sexual Activity   Alcohol use: Yes    Comment: Rare   Drug use: No   Sexual activity: Not Currently    Birth control/protection: Post-menopausal    Comment: 1st intercourse 82 yo-Fewer than 5 partners  Other Topics Concern   Not on file  Social History Narrative   Lives alone. Children live in [place], [place] and [address]   Social Determinants of Health   Financial Resource Strain: Low Risk  (11/04/2022)   Overall Financial Resource Strain (CARDIA)    Difficulty of Paying Living Expenses: Not hard at all  Food Insecurity: No Food Insecurity (11/04/2022)   Hunger Vital Sign    Worried About Running Out of Food in the Last Year: Never true    Ran Out of Food in the Last Year: Never true  Transportation Needs: No Transportation Needs (11/04/2022)   PRAPARE - Administrator, Civil Service (Medical): No    Lack of Transportation (Non-Medical): No  Physical Activity: Insufficiently Active (11/04/2022)   Exercise Vital Sign    Days of Exercise per Week: 3 days    Minutes of Exercise per Session: 30 min  Stress: No Stress  Concern Present (11/04/2022)   [employer] of Occupational Health - Occupational Stress Questionnaire    Feeling of Stress : Not at all  Social Connections: Moderately Integrated (11/04/2022)   Social Connection and Isolation Panel [NHANES]    Frequency of Communication with Friends and Family: More than three times a week    Frequency of Social Gatherings with Friends and Family: More than three times a week    Attends Religious Services: More than 4 times per year    Active Member of Clubs or Organizations: Yes    Attends  Club or Organization Meetings: 1 to 4 times per year    Marital Status: Divorced    Tobacco Counseling Counseling given: Not Answered   Clinical Intake:  Pre-visit preparation completed: Yes  Pain : No/denies pain     Nutritional Risks: None Diabetes: No  How often do you need to have someone help you when you read instructions, pamphlets, or other written materials from your doctor or pharmacy?: 1 - Never  Interpreter Needed?: No  Information entered by :: Renie Ora, LPN   Activities of Daily Living    11/07/2022   11:58 AM 11/04/2022   11:58 AM  In your present state of health, do you have any difficulty performing the following activities:  Hearing? 0 0  Vision? 0 0  Difficulty concentrating or making decisions? 0 0  Walking or climbing stairs? 0 0  Dressing or bathing? 0 0  Doing errands, shopping? 0 0  Preparing Food and eating ? N   Using the Toilet? N   In the past six months, have you accidently leaked urine? N   Do you have problems with loss of bowel control? N   Managing your Medications? N   Managing your Finances? N   Housekeeping or managing your Housekeeping? N     Patient Care Team: Junie Spencer, FNP as PCP - General (Family Medicine) Reece Packer, MD (Hematology and Oncology)  Indicate any recent Medical Services you may have received from other than [facility] providers in the past year (date may be  approximate).     Assessment:   This is a routine wellness examination for Alison [name].  Hearing/Vision screen Vision Screening - Comments:: Wears rx glasses - up to date with routine eye exams with  Dr.Johnson    Goals Addressed             This Visit's Progress    DIET - INCREASE WATER INTAKE         Depression Screen    11/04/2022   10:37 AM 09/27/2022   10:45 AM 03/25/2022   11:35 AM 09/21/2021   11:17 AM 07/08/2021   10:03 AM 07/02/2021   11:30 AM 03/30/2021   10:54 AM  PHQ 2/9 Scores  PHQ - 2 Score 0 1 2 0 1 4 0  PHQ- 9 Score 0 3 3  3 5 2     Fall Risk    11/04/2022   10:36 AM 03/25/2022   11:35 AM 09/21/2021   11:17 AM 09/07/2021   11:54 AM 07/08/2021   10:03 AM  Fall Risk   Falls in the past year? 0 0 0 0 0  Number falls in past yr: 0      Injury with Fall? 0      Risk for fall due to : No Fall Risks      Follow up Falls prevention discussed  Falls evaluation completed      MEDICARE RISK AT HOME: Medicare Risk at Home Any stairs in or around the home?: No If so, are there any without handrails?: No Home free of loose throw rugs in walkways, pet beds, electrical cords, etc?: Yes Adequate lighting in your home to reduce risk of falls?: Yes Life alert?: No Use of a cane, walker or w/c?: No Grab bars in the bathroom?: Yes Shower chair or bench in shower?: Yes Elevated toilet seat or a handicapped toilet?: Yes  TIMED UP AND GO:  Was the test performed?  No    Cognitive Function:  11/07/2022   12:00 PM 11/07/2022   11:59 AM 11/07/2022   11:58 AM 09/21/2021   11:14 AM 10/03/2018   10:37 AM  6CIT Screen  What Year? 0 points 0 points 0 points 0 points 0 points  What month? 0 points 0 points 0 points 0 points 0 points  What time? 0 points 0 points 0 points 0 points 0 points  Count back from 20 0 points 0 points 0 points 2 points 0 points  Months in reverse 0 points 0 points 0 points 0 points 0 points  Repeat phrase 0 points 0 points 0 points 2 points 0  points  Total Score 0 points 0 points 0 points 4 points 0 points    Immunizations Immunization History  Administered Date(s) Administered   Fluad Quad(high Dose 65+) 11/12/2018, 11/21/2019, 03/25/2022   Influenza Split 01/20/2018   Influenza, High Dose Seasonal PF 01/07/2016   Influenza, Seasonal, Injecte, Preservative Fre 12/03/2012, 01/01/2015   Influenza,inj,Quad PF,6+ Mos 12/03/2012, 01/01/2015   Influenza,trivalent, recombinat, inj, PF 11/11/2013, 10/25/2016, 01/20/2018   Influenza-Unspecified 12/03/2012, 11/11/2013, 01/01/2015, 10/25/2016, 11/24/2020   Moderna Sars-Covid-2 Vaccination 02/04/2019, 03/07/2019, 11/28/2019   Pneumococcal Conjugate-13 02/17/2016   Pneumococcal Polysaccharide-23 05/31/2017   Tdap 05/21/2019   Zoster Recombinant(Shingrix) 03/30/2021, 07/02/2021    TDAP status: Up to date  Flu Vaccine status: Up to date  Pneumococcal vaccine status: Up to date  Covid-19 vaccine status: Completed vaccines  Qualifies for Shingles Vaccine? Yes   Zostavax completed Yes   Shingrix Completed?: Yes  Screening Tests Health Maintenance  Topic Date Due   COVID-19 Vaccine (4 - 2023-24 season) 09/25/2022   INFLUENZA VACCINE  04/24/2023 (Originally 08/25/2022)   Colonoscopy  09/27/2023 (Originally 08/10/2022)   Medicare Annual Wellness (AWV)  11/04/2023   DTaP/Tdap/Td (2 - Td or Tdap) 05/20/2029   Pneumonia Vaccine 59+ Years old  Completed   DEXA SCAN  Completed   Zoster Vaccines- Shingrix  Completed   HPV VACCINES  Aged Out   Hepatitis C Screening  Discontinued    Health Maintenance  Health Maintenance Due  Topic Date Due   COVID-19 Vaccine (4 - 2023-24 season) 09/25/2022    Colorectal cancer screening: No longer required.   Mammogram status: No longer required due to age.  Bone Density status: Completed 09/27/2022. Results reflect: Bone density results: OSTEOPOROSIS. Repeat every 2 years.  Lung Cancer Screening: (Low Dose CT Chest recommended if Age 76-80  years, 20 pack-year currently smoking OR have quit w/in 15years.) does not qualify.   Lung Cancer Screening Referral: n/a  Additional Screening:  Hepatitis C Screening: does not qualify;   Vision Screening: Recommended annual ophthalmology exams for early detection of glaucoma and other disorders of the eye. Is the patient up to date with their annual eye exam?  Yes  Who is the provider or what is the name of the office in which the patient attends annual eye exams? Dr.Johnson  If pt is not established with a provider, would they like to be referred to a provider to establish care? No .   Dental Screening: Recommended annual dental exams for proper oral hygiene   Community Resource Referral / Chronic Care Management: CRR required this visit?  No   CCM required this visit?  No     Plan:     I have personally reviewed and noted the following in the patient's chart:   Medical and social history Use of alcohol, tobacco or illicit drugs  Current medications and supplements including opioid prescriptions.  Patient is not currently taking opioid prescriptions. Functional ability and status Nutritional status Physical activity Advanced directives List of other physicians Hospitalizations, surgeries, and ER visits in previous 12 months Vitals Screenings to include cognitive, depression, and falls Referrals and appointments  In addition, I have reviewed and discussed with patient certain preventive protocols, quality metrics, and best practice recommendations. A written personalized care plan for preventive services as well as general preventive health recommendations were provided to patient.     Lorrene Reid, LPN   16/10/[id number]   After Visit Summary: (MyChart) Due to this being a telephonic visit, the after visit summary with patients personalized plan was offered to patient via MyChart   Nurse Notes: none

## 2022-11-08 NOTE — Progress Notes (Signed)
Subjective:   Alison [name] is a 82 y.o. female who presents for Medicare Annual (Subsequent) preventive examination.  Visit Complete: Virtual I connected with  [name] on 11/04/2022 by a audio enabled telemedicine application and verified that I am speaking with the correct person using two identifiers.  Patient Location: Home  Provider Location: Home Office  I discussed the limitations of evaluation and management by telemedicine. The patient expressed understanding and agreed to proceed.  Vital Signs: Because this visit was a virtual/telehealth visit, some criteria may be missing or patient reported. Any vitals not documented were not able to be obtained and vitals that have been documented are patient reported.  Patient Medicare AWV questionnaire was completed by the patient on 11/04/2022; I have confirmed that all information answered by patient is correct and no changes since this date.  Cardiac Risk Factors include: advanced age (>73men, >25 women);hypertension;dyslipidemia     Objective:    Today's Vitals   11/04/22 1035  Weight: 196 lb (88.9 kg)  Height: 5\' 4"  (1.626 m)   Body mass index is 33.64 kg/m.     11/07/2022   11:57 AM 11/04/2022   11:57 AM 06/27/2022    4:12 PM 09/21/2021   11:13 AM 09/18/2020    1:06 PM 10/03/2018   10:32 AM 12/30/2016   10:11 AM  Advanced Directives  Does Patient Have a Medical Advance Directive? Yes Yes No No No No No  Type of Estate agent of [place];Living will Healthcare Power of [place];Living will       Copy of Healthcare Power of Attorney in Chart? No - copy requested No - copy requested       Would patient like information on creating a medical advance directive?    No - Patient declined No - Patient declined No - Patient declined     Current Medications (verified) Outpatient Encounter Medications as of 11/04/2022  Medication Sig   acetaminophen (TYLENOL) 650 MG CR tablet Take 1 tablet (650 mg  total) by mouth in the morning and at bedtime.   aspirin 81 MG tablet Take 81 mg by mouth daily.   benzonatate (TESSALON) 100 MG capsule Take 1 capsule (100 mg total) by mouth 3 (three) times daily as needed.   Brinzolamide-Brimonidine 1-0.2 % SUSP Apply to eye.   Calcium Carbonate-Vitamin D (CALCIUM + D PO) Take 2 capsules by mouth daily.    escitalopram (LEXAPRO) 10 MG tablet Take 1 tablet (10 mg total) by mouth daily.   estradiol (ESTRACE) 0.1 MG/GM vaginal cream PLACE 1 APPLICATORFUL VAGINALLY 2 (TWO) TIMES A WEEK.   fluticasone (FLONASE) 50 MCG/ACT nasal spray SPRAY 2 SPRAYS INTO EACH NOSTRIL EVERY DAY   KLOR-CON M20 20 MEQ tablet TAKE 1 TABLET BY MOUTH TWICE A DAY   latanoprost (XALATAN) 0.005 % ophthalmic solution 1 drop at bedtime.   levocetirizine (XYZAL) 5 MG tablet TAKE 1/2 TABLET (2.5 MG TOTAL) BY MOUTH EVERY EVENING.   losartan-hydrochlorothiazide (HYZAAR) 100-25 MG tablet TAKE 1 TABLET BY MOUTH EVERY DAY   metoprolol succinate (TOPROL-XL) 25 MG 24 hr tablet TAKE 0.5 TABLETS (12.5 MG TOTAL) BY MOUTH IN THE MORNING AND AT BEDTIME.   Omega-3 Fatty Acids (FISH OIL PO) Take 1 capsule by mouth daily.   rosuvastatin (CRESTOR) 10 MG tablet TAKE 1 TABLET BY MOUTH EVERY DAY   [DISCONTINUED] meloxicam (MOBIC) 7.5 MG tablet TAKE 1 TABLET BY MOUTH EVERY DAY   No facility-administered encounter medications on file as of 11/04/2022.  Allergies (verified) Patient has no known allergies.   History: Past Medical History:  Diagnosis Date   Anxiety    Arthritis    Breast cancer (HCC)    X2   Cervical dysplasia    GERD (gastroesophageal reflux disease)    takes otc meds   Glaucoma    Hyperlipidemia    Hypertension    Lichen sclerosus    Personal history of chemotherapy 2010   Personal history of radiation therapy 1999   Shingles 2012   Past Surgical History:  Procedure Laterality Date   BREAST BIOPSY Right 2010   BREAST LUMPECTOMY Right 2010   BREAST LUMPECTOMY Left    BREAST  SURGERY     Lumpectomy right and left   GYNECOLOGIC CRYOSURGERY     HYSTEROSCOPY     PORT-A-CATH REMOVAL Left 01/31/2014   Procedure: REMOVAL PORT-A-CATH;  Surgeon: Chevis Pretty III, MD;  Location: [facility] OR;  Service: General;  Laterality: Left;   TUBAL LIGATION     Family History  Problem Relation Age of Onset   Hypertension Mother    Cancer Mother        colon   Colon cancer Mother    Diabetes Sister    Heart disease Father    Breast cancer Maternal Aunt 78   Breast cancer Maternal Aunt    Social History   Socioeconomic History   Marital status: Divorced    Spouse name: Not on file   Number of children: 3   Years of education: 10   Highest education level: 10th grade  Occupational History   Occupation: Retired  Tobacco Use   Smoking status: Never   Smokeless tobacco: Never  Vaping Use   Vaping status: Never Used  Substance and Sexual Activity   Alcohol use: Yes    Comment: Rare   Drug use: No   Sexual activity: Not Currently    Birth control/protection: Post-menopausal    Comment: 1st intercourse 82 yo-Fewer than 5 partners  Other Topics Concern   Not on file  Social History Narrative   Lives alone. Children live in [place], [place] and [address]   Social Determinants of Health   Financial Resource Strain: Low Risk  (11/04/2022)   Overall Financial Resource Strain (CARDIA)    Difficulty of Paying Living Expenses: Not hard at all  Food Insecurity: No Food Insecurity (11/04/2022)   Hunger Vital Sign    Worried About Running Out of Food in the Last Year: Never true    Ran Out of Food in the Last Year: Never true  Transportation Needs: No Transportation Needs (11/04/2022)   PRAPARE - Administrator, Civil Service (Medical): No    Lack of Transportation (Non-Medical): No  Physical Activity: Insufficiently Active (11/04/2022)   Exercise Vital Sign    Days of Exercise per Week: 3 days    Minutes of Exercise per Session: 30 min  Stress: No Stress  Concern Present (11/04/2022)   [employer] of Occupational Health - Occupational Stress Questionnaire    Feeling of Stress : Not at all  Social Connections: Moderately Integrated (11/04/2022)   Social Connection and Isolation Panel [NHANES]    Frequency of Communication with Friends and Family: More than three times a week    Frequency of Social Gatherings with Friends and Family: More than three times a week    Attends Religious Services: More than 4 times per year    Active Member of Clubs or Organizations: Yes    Attends  Club or Organization Meetings: 1 to 4 times per year    Marital Status: Divorced    Tobacco Counseling Counseling given: Not Answered   Clinical Intake:  Pre-visit preparation completed: Yes  Pain : No/denies pain     Nutritional Risks: None Diabetes: No  How often do you need to have someone help you when you read instructions, pamphlets, or other written materials from your doctor or pharmacy?: 1 - Never  Interpreter Needed?: No  Information entered by :: Renie Ora, LPN   Activities of Daily Living    11/07/2022   11:58 AM 11/04/2022    1:57 PM  In your present state of health, do you have any difficulty performing the following activities:  Hearing? 0 0  Vision? 0 0  Difficulty concentrating or making decisions? 0 0  Walking or climbing stairs? 0 0  Dressing or bathing? 0 0  Doing errands, shopping? 0 0  Preparing Food and eating ? N N  Using the Toilet? N N  In the past six months, have you accidently leaked urine? N N  Do you have problems with loss of bowel control? N N  Managing your Medications? N N  Managing your Finances? N N  Housekeeping or managing your Housekeeping? N N    Patient Care Team: Junie Spencer, FNP as PCP - General (Family Medicine) Reece Packer, MD (Hematology and Oncology)  Indicate any recent Medical Services you may have received from other than [facility] providers in the past year (date may be  approximate).     Assessment:   This is a routine wellness examination for Alison [name].  Hearing/Vision screen Vision Screening - Comments:: Wears rx glasses - up to date with routine eye exams with  Dr.Johnson    Goals Addressed             This Visit's Progress    DIET - INCREASE WATER INTAKE         Depression Screen    11/04/2022   10:37 AM 09/27/2022   10:45 AM 03/25/2022   11:35 AM 09/21/2021   11:17 AM 07/08/2021   10:03 AM 07/02/2021   11:30 AM 03/30/2021   10:54 AM  PHQ 2/9 Scores  PHQ - 2 Score 0 1 2 0 1 4 0  PHQ- 9 Score 0 3 3  3 5 2     Fall Risk    11/04/2022   10:36 AM 03/25/2022   11:35 AM 09/21/2021   11:17 AM 09/07/2021   11:54 AM 07/08/2021   10:03 AM  Fall Risk   Falls in the past year? 0 0 0 0 0  Number falls in past yr: 0      Injury with Fall? 0      Risk for fall due to : No Fall Risks      Follow up Falls prevention discussed  Falls evaluation completed      MEDICARE RISK AT HOME: Medicare Risk at Home Any stairs in or around the home?: No If so, are there any without handrails?: No Home free of loose throw rugs in walkways, pet beds, electrical cords, etc?: Yes Adequate lighting in your home to reduce risk of falls?: Yes Life alert?: No Use of a cane, walker or w/c?: No Grab bars in the bathroom?: Yes Shower chair or bench in shower?: Yes Elevated toilet seat or a handicapped toilet?: Yes  TIMED UP AND GO:  Was the test performed?  No    Cognitive Function:  11/07/2022   12:00 PM 11/07/2022   11:59 AM 11/07/2022   11:58 AM 09/21/2021   11:14 AM 10/03/2018   10:37 AM  6CIT Screen  What Year? 0 points 0 points 0 points 0 points 0 points  What month? 0 points 0 points 0 points 0 points 0 points  What time? 0 points 0 points 0 points 0 points 0 points  Count back from 20 0 points 0 points 0 points 2 points 0 points  Months in reverse 0 points 0 points 0 points 0 points 0 points  Repeat phrase 0 points 0 points 0 points 2 points 0  points  Total Score 0 points 0 points 0 points 4 points 0 points    Immunizations Immunization History  Administered Date(s) Administered   Fluad Quad(high Dose 65+) 11/12/2018, 11/21/2019, 03/25/2022   Influenza Split 01/20/2018   Influenza, High Dose Seasonal PF 01/07/2016   Influenza, Seasonal, Injecte, Preservative Fre 12/03/2012, 01/01/2015   Influenza,inj,Quad PF,6+ Mos 12/03/2012, 01/01/2015   Influenza,trivalent, recombinat, inj, PF 11/11/2013, 10/25/2016, 01/20/2018   Influenza-Unspecified 12/03/2012, 11/11/2013, 01/01/2015, 10/25/2016, 11/24/2020   Moderna Sars-Covid-2 Vaccination 02/04/2019, 03/07/2019, 11/28/2019   Pneumococcal Conjugate-13 02/17/2016   Pneumococcal Polysaccharide-23 05/31/2017   Tdap 05/21/2019   Zoster Recombinant(Shingrix) 03/30/2021, 07/02/2021    TDAP status: Up to date  Flu Vaccine status: Up to date  Pneumococcal vaccine status: Up to date  Covid-19 vaccine status: Completed vaccines  Qualifies for Shingles Vaccine? Yes   Zostavax completed Yes   Shingrix Completed?: Yes  Screening Tests Health Maintenance  Topic Date Due   COVID-19 Vaccine (4 - 2023-24 season) 09/25/2022   INFLUENZA VACCINE  04/24/2023 (Originally 08/25/2022)   Colonoscopy  09/27/2023 (Originally 08/10/2022)   Medicare Annual Wellness (AWV)  11/04/2023   DTaP/Tdap/Td (2 - Td or Tdap) 05/20/2029   Pneumonia Vaccine 55+ Years old  Completed   DEXA SCAN  Completed   Zoster Vaccines- Shingrix  Completed   HPV VACCINES  Aged Out   Hepatitis C Screening  Discontinued    Health Maintenance  Health Maintenance Due  Topic Date Due   COVID-19 Vaccine (4 - 2023-24 season) 09/25/2022    Colorectal cancer screening: No longer required.   Mammogram status: No longer required due to age.  Bone Density status: Completed 09/27/2022. Results reflect: Bone density results: OSTEOPOROSIS. Repeat every 2 years.  Lung Cancer Screening: (Low Dose CT Chest recommended if Age 38-80  years, 20 pack-year currently smoking OR have quit w/in 15years.) does not qualify.   Lung Cancer Screening Referral: n/a  Additional Screening:  Hepatitis C Screening: does not qualify;   Vision Screening: Recommended annual ophthalmology exams for early detection of glaucoma and other disorders of the eye. Is the patient up to date with their annual eye exam?  Yes  Who is the provider or what is the name of the office in which the patient attends annual eye exams? Dr.Johnson  If pt is not established with a provider, would they like to be referred to a provider to establish care? No .   Dental Screening: Recommended annual dental exams for proper oral hygiene   Community Resource Referral / Chronic Care Management: CRR required this visit?  No   CCM required this visit?  No     Plan:     I have personally reviewed and noted the following in the patient's chart:   Medical and social history Use of alcohol, tobacco or illicit drugs  Current medications and supplements including opioid prescriptions.  Patient is not currently taking opioid prescriptions. Functional ability and status Nutritional status Physical activity Advanced directives List of other physicians Hospitalizations, surgeries, and ER visits in previous 12 months Vitals Screenings to include cognitive, depression, and falls Referrals and appointments  In addition, I have reviewed and discussed with patient certain preventive protocols, quality metrics, and best practice recommendations. A written personalized care plan for preventive services as well as general preventive health recommendations were provided to patient.     Lorrene Reid, LPN   [id number]   After Visit Summary: (MyChart) Due to this being a telephonic visit, the after visit summary with patients personalized plan was offered to patient via MyChart   Nurse Notes: none

## 2022-12-12 ENCOUNTER — Encounter: Payer: Self-pay | Admitting: Family Medicine

## 2022-12-12 ENCOUNTER — Ambulatory Visit (INDEPENDENT_AMBULATORY_CARE_PROVIDER_SITE_OTHER): Payer: 59 | Admitting: Family Medicine

## 2022-12-12 VITALS — BP 124/70 | HR 86 | Temp 97.0°F | Ht 64.0 in | Wt 194.4 lb

## 2022-12-12 DIAGNOSIS — J40 Bronchitis, not specified as acute or chronic: Secondary | ICD-10-CM

## 2022-12-12 DIAGNOSIS — J329 Chronic sinusitis, unspecified: Secondary | ICD-10-CM | POA: Diagnosis not present

## 2022-12-12 MED ORDER — BENZONATATE 200 MG PO CAPS: 200.0000 mg | ORAL_CAPSULE | Freq: Three times a day (TID) | ORAL | 0 refills | Status: DC | PRN

## 2022-12-12 MED ORDER — CEFUROXIME AXETIL 250 MG PO TABS: 250.0000 mg | ORAL_TABLET | Freq: Two times a day (BID) | ORAL | 0 refills | Status: AC

## 2022-12-12 NOTE — Progress Notes (Signed)
Chief Complaint  Patient presents with   Cough   Nasal Congestion    HPI  Patient presents today for Patient presents with upper respiratory congestion. Rhinorrhea that is frequently purulent. There is moderate sore throat. Patient reports coughing frequently as well.  yellow sputum noted. There is no fever, chills or sweats. The patient denies being short of breath. Onset was 3-5 days ago. Gradually worsening. Tried OTCs without improvement.  PMH: Smoking status noted ROS: Per HPI  Objective: BP 124/70   Pulse 86   Temp (!) 97 F (36.1 C)   Ht 5\' 4"  (1.626 m)   Wt 194 lb 6.4 oz (88.2 kg)   LMP 01/25/2004   SpO2 96%   BMI 33.37 kg/m  Gen: NAD, alert, cooperative with exam HEENT: NCAT, Nasal passages swollen, red TMS RED CV: RRR, good S1/S2, no murmur Resp: Bronchitis changes with scattered wheezes, non-labored Ext: No edema, warm Neuro: Alert and oriented, No gross deficits  Assessment and plan:  1. Sinobronchitis     Meds ordered this encounter  Medications   cefUROXime (CEFTIN) 250 MG tablet    Sig: Take 1 tablet (250 mg total) by mouth 2 (two) times daily with a meal for 10 days.    Dispense:  20 tablet    Refill:  0   benzonatate (TESSALON) 200 MG capsule    Sig: Take 1 capsule (200 mg total) by mouth 3 (three) times daily as needed for cough.    Dispense:  20 capsule    Refill:  0    No orders of the defined types were placed in this encounter.   Follow up as needed.  Mechele Claude, MD

## 2022-12-16 ENCOUNTER — Other Ambulatory Visit: Payer: Self-pay | Admitting: Family

## 2022-12-16 DIAGNOSIS — I10 Essential (primary) hypertension: Secondary | ICD-10-CM

## 2022-12-16 DIAGNOSIS — E782 Mixed hyperlipidemia: Secondary | ICD-10-CM

## 2023-03-27 ENCOUNTER — Ambulatory Visit: Payer: 59 | Admitting: Family

## 2023-04-03 ENCOUNTER — Ambulatory Visit: Payer: Self-pay | Admitting: Family

## 2023-04-03 NOTE — Telephone Encounter (Signed)
 Copied from CRM [id number]. Topic: Clinical - Red Word Triage >> Apr 03, 2023  4:21 PM Ivette P wrote: Red Word that prompted transfer to Nurse Triage:  03/07 - fell the other night and problems with  left knee. twisted it when got up.   Chief Complaint: Fall Symptoms: Left knee pain, body aches from fall Frequency: Single fall Pertinent Negatives: Patient denies any other injury or complaint  Disposition: [] ED /[] Urgent Care (no appt availability in office) / [] Appointment(In office/virtual)/ []  [facility]/ [] Home Care/ [] Refused Recommended Disposition /[] [facility]/ []  Follow-up with PCP Additional Notes: Patient reports that 3 days ago she stripped and fell backwards. She denies any injury from the fall stating "I just feel sore." She states that while getting up she twisted her left knee and has been experiencing some pain to that knee. She states her pain worsens with ambulation and weight bearing. Appointment made for tomorrow morning per patient's request.      Reason for Disposition  [1] Recent fall AND [2] no injury    Minor knee injury from getting up, not from fall, appointment per her request  Answer Assessment - Initial Assessment Questions 1. MECHANISM: "How did the fall happen?"     Tripped going to the bathroom  2. DOMESTIC VIOLENCE AND ELDER ABUSE SCREENING: "Did you fall because someone pushed you or tried to hurt you?" If Yes, ask: "Are you safe now?"     No 3. ONSET: "When did the fall happen?" (e.g., minutes, hours, or days ago)     3 days ago, single fall 4. LOCATION: "What part of the body hit the ground?" (e.g., back, buttocks, head, hips, knees, hands, head, stomach)     Back 5. INJURY: "Did you hurt (injure) yourself when you fell?" If Yes, ask: "What did you injure? Tell me more about this?" (e.g., body area; type of injury; pain severity)"     Twisted left knee  6. PAIN: "Is there any pain?" If Yes, ask: "How bad is the pain?" (e.g.,  Scale 1-10; or mild,  moderate, severe)   - NONE (0): No pain   - MILD (1-3): Doesn't interfere with normal activities    - MODERATE (4-7): Interferes with normal activities or awakens from sleep    - SEVERE (8-10): Excruciating pain, unable to do any normal activities      7/10 7. SIZE: For cuts, bruises, or swelling, ask: "How large is it?" (e.g., inches or centimeters)      No 9. OTHER SYMPTOMS: "Do you have any other symptoms?" (e.g., dizziness, fever, weakness; new onset or worsening).      Diffuse soreness from fall 10. CAUSE: "What do you think caused the fall (or falling)?" (e.g., tripped, dizzy spell)       Tripped  Protocols used: Falls and [facility]

## 2023-04-04 ENCOUNTER — Encounter: Payer: Self-pay | Admitting: Nurse Practitioner

## 2023-04-04 ENCOUNTER — Telehealth: Payer: Self-pay | Admitting: Family Medicine

## 2023-04-04 ENCOUNTER — Ambulatory Visit: Admitting: Family

## 2023-04-04 ENCOUNTER — Ambulatory Visit: Admitting: Nurse Practitioner

## 2023-04-04 ENCOUNTER — Ambulatory Visit (INDEPENDENT_AMBULATORY_CARE_PROVIDER_SITE_OTHER)

## 2023-04-04 VITALS — BP 136/66 | HR 71 | Temp 97.0°F | Ht 64.0 in | Wt 194.6 lb

## 2023-04-04 DIAGNOSIS — Z961 Presence of intraocular lens: Secondary | ICD-10-CM | POA: Diagnosis not present

## 2023-04-04 DIAGNOSIS — M25562 Pain in left knee: Secondary | ICD-10-CM

## 2023-04-04 DIAGNOSIS — W19XXXA Unspecified fall, initial encounter: Secondary | ICD-10-CM

## 2023-04-04 DIAGNOSIS — H401131 Primary open-angle glaucoma, bilateral, mild stage: Secondary | ICD-10-CM | POA: Diagnosis not present

## 2023-04-04 NOTE — Telephone Encounter (Unsigned)
 Copied from CRM [id number]. Topic: Clinical - Lab/Test Results >> Apr 04, 2023 12:08 PM Antwanette L wrote: Reason for CRM: Patient is calling in about xray results from today. Patient is requesting a callback at [phone]

## 2023-04-04 NOTE — Telephone Encounter (Signed)
 Patient asking for a review

## 2023-04-04 NOTE — Progress Notes (Signed)
 Acute Office Visit  Subjective:     Patient ID: Alison [name], female    DOB: [birth year], 83 y.o.   MRN: [id number]  Chief Complaint  Patient presents with   Alison [name] Friday having left knee pain and swelling since then   Knee Pain    HPI Alison [name] is a 83 yrs old female presents 04/04/2023 for an acute visit concerns for left knee pain post fall  Alison [name] reports that she fell on Friday on Friday and had to use to relieve to stand up, she denied loss of conscious or hitting her head.  She was already having issues with the left main and report pain on the left.  She has been getting injection on the right. Knee Pain: Patient presents with knee pain involving the  left knee. Onset of the symptoms was several days ago. Inciting event: injured while fell using the knee to get up . Current symptoms include pain located lateral . Pain is aggravated by any weight bearing and going up and down stairs.  Patient has had prior knee problems. Evaluation to date: none. Treatment to date: avoidance of offending activity and rest.  Review of Systems  Constitutional:  Negative for chills and fever.  Respiratory:  Negative for cough, shortness of breath and wheezing.   Cardiovascular:  Negative for chest pain and leg swelling.  Musculoskeletal:  Positive for falls and joint pain.       Left knee pain Larey Seat 03/31/2023  Skin:  Negative for itching and rash.  Neurological:  Negative for dizziness, loss of consciousness, weakness and headaches.   Negative unless indicated in HPI    Objective:    BP 136/66   Pulse 71   Temp (!) 97 F (36.1 C) (Temporal)   Ht 5\' 4"  (1.626 m)   Wt 194 lb 9.6 oz (88.3 kg)   LMP 01/25/2004   SpO2 97%   BMI 33.40 kg/m  BP Readings from Last 3 Encounters:  04/04/23 136/66  12/12/22 124/70  09/27/22 132/78   Wt Readings from Last 3 Encounters:  04/04/23 194 lb 9.6 oz (88.3 kg)  12/12/22 194 lb 6.4 oz (88.2 kg)  11/04/22 196 lb (88.9 kg)      Physical  Exam Vitals and nursing note reviewed.  Constitutional:      General: She is not in acute distress. HENT:     Head: Normocephalic and atraumatic.     Mouth/Throat:     Mouth: Mucous membranes are moist.  Eyes:     General: No scleral icterus.    Extraocular Movements: Extraocular movements intact.     Conjunctiva/sclera: Conjunctivae normal.     Pupils: Pupils are equal, round, and reactive to light.  Cardiovascular:     Heart sounds: Normal heart sounds.  Pulmonary:     Effort: Pulmonary effort is normal.     Breath sounds: Normal breath sounds.  Musculoskeletal:     Left knee: Decreased range of motion. Tenderness present.  Skin:    General: Skin is warm and dry.     Findings: No rash.  Neurological:     Mental Status: She is alert and oriented to person, place, and time.     Gait: Gait is intact.     Comments: Use a cane fro ambulation  Psychiatric:        Mood and Affect: Mood normal.        Behavior: Behavior normal.  Thought Content: Thought content normal.        Judgment: Judgment normal.    Left knee x-ray ordered awaiting final report from radiologist No results found for any visits on 04/04/23.      Assessment & Plan:  Fall, initial encounter -     DG Knee 1-2 Views Left  Acute pain of left knee -     DG Knee 1-2 Views Left  Alison [name] is an 83 year old, African-American female seen today for left knee pain post fall, no acute distress Left knee pain: X-ray ordered awaiting final report from radiologist Use heat pad 15 minutes inclement, not to sleep with heat pad on; continue taking meloxicam previously prescribed twice daily with food Always use cane for ambulation   The above assessment and management plan was discussed with the patient. The patient verbalized understanding of and has agreed to the management plan. Patient is aware to call the clinic if they develop any new symptoms or if symptoms persist or worsen. Patient is aware when to return to  the clinic for a follow-up visit. Patient educated on when it is appropriate to go to the emergency department.  Return if symptoms worsen or fail to improve.  Alison [name], [facility] [address] [place], [place] [address] [phone]  Note: This document was prepared by [employer] voice dictation technology and any errors that results from this process are unintentional.

## 2023-04-04 NOTE — Telephone Encounter (Signed)
Alison [name]

## 2023-04-17 ENCOUNTER — Ambulatory Visit: Payer: 59 | Admitting: Family

## 2023-04-17 ENCOUNTER — Encounter: Payer: Self-pay | Admitting: Family

## 2023-04-17 VITALS — BP 129/73 | HR 64 | Temp 97.6°F | Ht 64.0 in | Wt 197.0 lb

## 2023-04-17 DIAGNOSIS — F320 Major depressive disorder, single episode, mild: Secondary | ICD-10-CM

## 2023-04-17 DIAGNOSIS — Z0001 Encounter for general adult medical examination with abnormal findings: Secondary | ICD-10-CM | POA: Diagnosis not present

## 2023-04-17 DIAGNOSIS — I10 Essential (primary) hypertension: Secondary | ICD-10-CM | POA: Diagnosis not present

## 2023-04-17 DIAGNOSIS — Z0000 Encounter for general adult medical examination without abnormal findings: Secondary | ICD-10-CM | POA: Diagnosis not present

## 2023-04-17 DIAGNOSIS — E782 Mixed hyperlipidemia: Secondary | ICD-10-CM

## 2023-04-17 DIAGNOSIS — F419 Anxiety disorder, unspecified: Secondary | ICD-10-CM | POA: Diagnosis not present

## 2023-04-17 DIAGNOSIS — M170 Bilateral primary osteoarthritis of knee: Secondary | ICD-10-CM

## 2023-04-17 DIAGNOSIS — Z23 Encounter for immunization: Secondary | ICD-10-CM

## 2023-04-17 DIAGNOSIS — K5900 Constipation, unspecified: Secondary | ICD-10-CM

## 2023-04-17 DIAGNOSIS — Z853 Personal history of malignant neoplasm of breast: Secondary | ICD-10-CM | POA: Diagnosis not present

## 2023-04-17 LAB — LIPID PANEL

## 2023-04-17 NOTE — Progress Notes (Signed)
 Subjective:    Patient ID: Alison [name], female    DOB: 06-02-1940, 83 y.o.   MRN: [id number]  Chief Complaint  Patient presents with   Medical Management of Chronic Issues   PT presents to the office today for CPE and chronic follow up. She is followed by Oncologists annually for Left breast cancer in 1998 and right breast cancer 2010.     She is followed by Ortho for bilateral knee pain and getting gel injections. She completed PT.  Hypertension This is a chronic problem. The current episode started more than 1 year ago. The problem has been resolved since onset. The problem is controlled. Associated symptoms include anxiety, malaise/fatigue ("Some times") and peripheral edema. Pertinent negatives include no shortness of breath. Risk factors for coronary artery disease include dyslipidemia, diabetes mellitus, obesity and sedentary lifestyle. The current treatment provides moderate improvement.  Back Pain This is a chronic problem. The current episode started more than 1 year ago. The problem occurs intermittently. The problem has been waxing and waning since onset. The pain is present in the lumbar spine. The quality of the pain is described as aching. The pain is at a severity of 4/10. The pain is mild.  Arthritis Presents for follow-up visit. She complains of pain and stiffness. Affected locations include the left knee and right knee. Her pain is at a severity of 5/10.  Hyperlipidemia This is a chronic problem. The current episode started more than 1 year ago. The problem is uncontrolled. Recent lipid tests were reviewed and are high. Exacerbating diseases include obesity. Pertinent negatives include no shortness of breath. Current antihyperlipidemic treatment includes statins. The current treatment provides moderate improvement of lipids. Risk factors for coronary artery disease include dyslipidemia, hypertension, a sedentary lifestyle and post-menopausal.  Depression        This is a  chronic problem.  The current episode started more than 1 year ago.   The problem occurs intermittently.  Associated symptoms include helplessness, hopelessness and sad.  Past treatments include SSRIs - Selective serotonin reuptake inhibitors.  Past medical history includes anxiety.   Anxiety Presents for follow-up visit. Symptoms include excessive worry and nervous/anxious behavior. Patient reports no shortness of breath. Symptoms occur occasionally. The severity of symptoms is mild.    Constipation This is a chronic problem. The current episode started more than 1 year ago. The problem has been waxing and waning since onset. Her stool frequency is 2 to 3 times per week. Associated symptoms include back pain. Risk factors include obesity. She has tried fiber for the symptoms. The treatment provided moderate relief.      Review of Systems  Constitutional:  Positive for malaise/fatigue ("Some times").  Respiratory:  Negative for shortness of breath.   Gastrointestinal:  Positive for constipation.  Musculoskeletal:  Positive for back pain and stiffness.  Psychiatric/Behavioral:  The patient is nervous/anxious.   All other systems reviewed and are negative.  Family History  Problem Relation Age of Onset   Hypertension Mother    Cancer Mother        colon   Colon cancer Mother    Diabetes Sister    Heart disease Father    Breast cancer Maternal Aunt 36   Breast cancer Maternal Aunt    Social History   Socioeconomic History   Marital status: Divorced    Spouse name: Not on file   Number of children: 3   Years of education: 10   Highest education level: 10th grade  Occupational History   Occupation: Retired  Tobacco Use   Smoking status: Never   Smokeless tobacco: Never  Vaping Use   Vaping status: Never Used  Substance and Sexual Activity   Alcohol use: Yes    Comment: Rare   Drug use: No   Sexual activity: Not Currently    Birth control/protection: Post-menopausal     Comment: 1st intercourse 83 yo-Fewer than 5 partners  Other Topics Concern   Not on file  Social History Narrative   Lives alone. Children live in [place], [place] and [address]   Social Drivers of Corporate investment banker Strain: Low Risk  (11/04/2022)   Overall Financial Resource Strain (CARDIA)    Difficulty of Paying Living Expenses: Not hard at all  Food Insecurity: No Food Insecurity (11/04/2022)   Hunger Vital Sign    Worried About Running Out of Food in the Last Year: Never true    Ran Out of Food in the Last Year: Never true  Transportation Needs: No Transportation Needs (11/04/2022)   PRAPARE - Administrator, Civil Service (Medical): No    Lack of Transportation (Non-Medical): No  Physical Activity: Insufficiently Active (11/04/2022)   Exercise Vital Sign    Days of Exercise per Week: 3 days    Minutes of Exercise per Session: 30 min  Stress: No Stress Concern Present (11/04/2022)   [employer] of Occupational Health - Occupational Stress Questionnaire    Feeling of Stress : Not at all  Social Connections: Moderately Integrated (11/04/2022)   Social Connection and Isolation Panel [NHANES]    Frequency of Communication with Friends and Family: More than three times a week    Frequency of Social Gatherings with Friends and Family: More than three times a week    Attends Religious Services: More than 4 times per year    Active Member of [employer] or Organizations: Yes    Attends Banker Meetings: 1 to 4 times per year    Marital Status: Divorced       Objective:   Physical Exam Vitals reviewed.  Constitutional:      General: She is not in acute distress.    Appearance: She is well-developed. She is obese.  HENT:     Head: Normocephalic and atraumatic.     Right Ear: Tympanic membrane normal.     Left Ear: Tympanic membrane normal.  Eyes:     Pupils: Pupils are equal, round, and reactive to light.  Neck:     Thyroid: No  thyromegaly.  Cardiovascular:     Rate and Rhythm: Normal rate and regular rhythm.     Heart sounds: Normal heart sounds. No murmur heard. Pulmonary:     Effort: Pulmonary effort is normal. No respiratory distress.     Breath sounds: Normal breath sounds. No wheezing.  Abdominal:     General: Bowel sounds are normal. There is no distension.     Palpations: Abdomen is soft.     Tenderness: There is no abdominal tenderness.  Musculoskeletal:        General: No tenderness.     Cervical back: Normal range of motion and neck supple.     Right lower leg: Edema present.     Left lower leg: Edema present.     Comments: Pain in bilateral knees with flexion and extension  Skin:    General: Skin is warm and dry.  Neurological:     Mental Status: She is alert and oriented  to person, place, and time.     Cranial Nerves: No cranial nerve deficit.     Deep Tendon Reflexes: Reflexes are normal and symmetric.  Psychiatric:        Behavior: Behavior normal.        Thought Content: Thought content normal.        Judgment: Judgment normal.       BP 129/73   Pulse 64   Temp 97.6 F (36.4 C) (Temporal)   Ht 5\' 4"  (1.626 m)   Wt 197 lb (89.4 kg)   LMP 01/25/2004   SpO2 97%   BMI 33.81 kg/m      Assessment & Plan:  NATARSHA HURWITZ comes in today with chief complaint of Medical Management of Chronic Issues   Diagnosis and orders addressed:  1. Anxiety - CMP14+EGFR - CBC with Differential/Platelet - TSH  2. Constipation, unspecified constipation type - CMP14+EGFR - CBC with Differential/Platelet - TSH  3. Depression, major, single episode, mild (HCC) - CMP14+EGFR - CBC with Differential/Platelet - TSH  4. HX: breast cancer - CMP14+EGFR - CBC with Differential/Platelet  5. Moderate mixed hyperlipidemia not requiring statin therapy - CMP14+EGFR - CBC with Differential/Platelet  6. Hypertension with goal blood pressure less than 130/80 - CMP14+EGFR - CBC with  Differential/Platelet  7. Primary osteoarthritis of both knees - CMP14+EGFR - CBC with Differential/Platelet  8. Annual physical exam (Primary) - CMP14+EGFR - CBC with Differential/Platelet - Lipid panel - TSH   Labs pending Fall prevention  Continue medications  Health Maintenance reviewed Diet and exercise encouraged  Follow up plan: 6 months    Jannifer Rodney, FNP

## 2023-04-17 NOTE — Addendum Note (Signed)
 Addended by: Jannifer Rodney A on: 04/17/2023 11:09 AM   Modules accepted: Level of Service

## 2023-04-17 NOTE — Patient Instructions (Signed)
 Health Maintenance After Age 82 After age 4, you are at a higher risk for certain long-term diseases and infections as well as injuries from falls. Falls are a major cause of broken bones and head injuries in people who are older than age 47. Getting regular preventive care can help to keep you healthy and well. Preventive care includes getting regular testing and making lifestyle changes as recommended by your health care provider. Talk with your health care provider about: Which screenings and tests you should have. A screening is a test that checks for a disease when you have no symptoms. A diet and exercise plan that is right for you. What should I know about screenings and tests to prevent falls? Screening and testing are the best ways to find a health problem early. Early diagnosis and treatment give you the best chance of managing medical conditions that are common after age 37. Certain conditions and lifestyle choices may make you more likely to have a fall. Your health care provider may recommend: Regular vision checks. Poor vision and conditions such as cataracts can make you more likely to have a fall. If you wear glasses, make sure to get your prescription updated if your vision changes. Medicine review. Work with your health care provider to regularly review all of the medicines you are taking, including over-the-counter medicines. Ask your health care provider about any side effects that may make you more likely to have a fall. Tell your health care provider if any medicines that you take make you feel dizzy or sleepy. Strength and balance checks. Your health care provider may recommend certain tests to check your strength and balance while standing, walking, or changing positions. Foot health exam. Foot pain and numbness, as well as not wearing proper footwear, can make you more likely to have a fall. Screenings, including: Osteoporosis screening. Osteoporosis is a condition that causes  the bones to get weaker and break more easily. Blood pressure screening. Blood pressure changes and medicines to control blood pressure can make you feel dizzy. Depression screening. You may be more likely to have a fall if you have a fear of falling, feel depressed, or feel unable to do activities that you used to do. Alcohol use screening. Using too much alcohol can affect your balance and may make you more likely to have a fall. Follow these instructions at home: Lifestyle Do not drink alcohol if: Your health care provider tells you not to drink. If you drink alcohol: Limit how much you have to: 0-1 drink a day for women. 0-2 drinks a day for men. Know how much alcohol is in your drink. In the [place] one drink equals one 12 oz bottle of beer (355 mL), one 5 oz glass of wine (148 mL), or one 1 oz glass of hard liquor (44 mL). Do not use any products that contain nicotine or tobacco. These products include cigarettes, chewing tobacco, and vaping devices, such as e-cigarettes. If you need help quitting, ask your health care provider. Activity  Follow a regular exercise program to stay fit. This will help you maintain your balance. Ask your health care provider what types of exercise are appropriate for you. If you need a cane or walker, use it as recommended by your health care provider. Wear supportive shoes that have nonskid soles. Safety  Remove any tripping hazards, such as rugs, cords, and clutter. Install safety equipment such as grab bars in bathrooms and safety rails on stairs. Keep rooms and walkways  well-lit. General instructions Talk with your health care provider about your risks for falling. Tell your health care provider if: You fall. Be sure to tell your health care provider about all falls, even ones that seem minor. You feel dizzy, tiredness (fatigue), or off-balance. Take over-the-counter and prescription medicines only as told by your health care provider. These include  supplements. Eat a healthy diet and maintain a healthy weight. A healthy diet includes low-fat dairy products, low-fat (lean) meats, and fiber from whole grains, beans, and lots of fruits and vegetables. Stay current with your vaccines. Schedule regular health, dental, and eye exams. Summary Having a healthy lifestyle and getting preventive care can help to protect your health and wellness after age 11. Screening and testing are the best way to find a health problem early and help you avoid having a fall. Early diagnosis and treatment give you the best chance for managing medical conditions that are more common for people who are older than age 28. Falls are a major cause of broken bones and head injuries in people who are older than age 48. Take precautions to prevent a fall at home. Work with your health care provider to learn what changes you can make to improve your health and wellness and to prevent falls. This information is not intended to replace advice given to you by your health care provider. Make sure you discuss any questions you have with your health care provider. Document Revised: 06/01/2020 Document Reviewed: 06/01/2020 Elsevier Patient Education  2024 [employer].

## 2023-04-18 LAB — CMP14+EGFR
ALT: 15 IU/L (ref 0–32)
AST: 22 IU/L (ref 0–40)
Albumin: 4.1 g/dL (ref 3.7–4.7)
Alkaline Phosphatase: 75 IU/L (ref 44–121)
BUN/Creatinine Ratio: 22 (ref 12–28)
BUN: 19 mg/dL (ref 8–27)
Bilirubin Total: 1.1 mg/dL (ref 0.0–1.2)
CO2: 23 mmol/L (ref 20–29)
Calcium: 9.7 mg/dL (ref 8.7–10.3)
Chloride: 103 mmol/L (ref 96–106)
Creatinine, Ser: 0.85 mg/dL (ref 0.57–1.00)
Globulin, Total: 2.4 g/dL (ref 1.5–4.5)
Glucose: 92 mg/dL (ref 70–99)
Potassium: 4.3 mmol/L (ref 3.5–5.2)
Sodium: 141 mmol/L (ref 134–144)
Total Protein: 6.5 g/dL (ref 6.0–8.5)
eGFR: 68 mL/min/{1.73_m2} (ref >59)

## 2023-04-18 LAB — CBC WITH DIFFERENTIAL/PLATELET
Basophils Absolute: 0 10*3/uL (ref 0.0–0.2)
Basos: 1 %
EOS (ABSOLUTE): 0.1 10*3/uL (ref 0.0–0.4)
Eos: 1 %
Hematocrit: 37.3 % (ref 34.0–46.6)
Hemoglobin: 12.3 g/dL (ref 11.1–15.9)
Immature Grans (Abs): 0 10*3/uL (ref 0.0–0.1)
Immature Granulocytes: 0 %
Lymphocytes Absolute: 1.1 10*3/uL (ref 0.7–3.1)
Lymphs: 22 %
MCH: 32.5 pg (ref 26.6–33.0)
MCHC: 33 g/dL (ref 31.5–35.7)
MCV: 98 fL — ABNORMAL HIGH (ref 79–97)
Monocytes Absolute: 0.5 10*3/uL (ref 0.1–0.9)
Monocytes: 10 %
Neutrophils Absolute: 3.2 10*3/uL (ref 1.4–7.0)
Neutrophils: 66 %
Platelets: 249 10*3/uL (ref 150–450)
RBC: 3.79 x10E6/uL (ref 3.77–5.28)
RDW: 12.7 % (ref 11.7–15.4)
WBC: 4.9 10*3/uL (ref 3.4–10.8)

## 2023-04-18 LAB — LIPID PANEL
Cholesterol, Total: 170 mg/dL (ref 100–199)
HDL: 59 mg/dL (ref >39)
LDL CALC COMMENT:: 2.9 ratio (ref 0.0–4.4)
LDL Chol Calc (NIH): 99 mg/dL (ref 0–99)
Triglycerides: 64 mg/dL (ref 0–149)
VLDL Cholesterol Cal: 12 mg/dL (ref 5–40)

## 2023-04-18 LAB — TSH: TSH: 0.851 u[IU]/mL (ref 0.450–4.500)

## 2023-05-17 ENCOUNTER — Other Ambulatory Visit: Payer: Self-pay | Admitting: Family

## 2023-05-17 DIAGNOSIS — F419 Anxiety disorder, unspecified: Secondary | ICD-10-CM

## 2023-05-17 DIAGNOSIS — E782 Mixed hyperlipidemia: Secondary | ICD-10-CM

## 2023-05-17 DIAGNOSIS — F320 Major depressive disorder, single episode, mild: Secondary | ICD-10-CM

## 2023-05-17 DIAGNOSIS — I10 Essential (primary) hypertension: Secondary | ICD-10-CM

## 2023-05-22 ENCOUNTER — Other Ambulatory Visit: Payer: Self-pay | Admitting: Family

## 2023-05-22 DIAGNOSIS — I10 Essential (primary) hypertension: Secondary | ICD-10-CM

## 2023-05-27 ENCOUNTER — Other Ambulatory Visit: Payer: Self-pay | Admitting: Family

## 2023-06-14 ENCOUNTER — Other Ambulatory Visit: Payer: Self-pay | Admitting: Family

## 2023-06-22 ENCOUNTER — Other Ambulatory Visit: Payer: Self-pay | Admitting: Hematology

## 2023-06-22 DIAGNOSIS — Z0000 Encounter for general adult medical examination without abnormal findings: Secondary | ICD-10-CM

## 2023-08-07 ENCOUNTER — Ambulatory Visit

## 2023-08-19 ENCOUNTER — Other Ambulatory Visit: Payer: Self-pay | Admitting: Family

## 2023-08-30 ENCOUNTER — Ambulatory Visit
Admission: RE | Admit: 2023-08-30 | Discharge: 2023-08-30 | Disposition: A | Discharge status: Home / Self Care | Source: Ambulatory Visit | Attending: Hematology

## 2023-08-30 DIAGNOSIS — Z0000 Encounter for general adult medical examination without abnormal findings: Secondary | ICD-10-CM

## 2023-08-30 DIAGNOSIS — Z1231 Encounter for screening mammogram for malignant neoplasm of breast: Secondary | ICD-10-CM | POA: Diagnosis not present

## 2023-09-26 ENCOUNTER — Encounter: Payer: Self-pay | Admitting: Sports Medicine

## 2023-10-02 ENCOUNTER — Other Ambulatory Visit: Payer: Self-pay | Admitting: Family

## 2023-10-02 DIAGNOSIS — F419 Anxiety disorder, unspecified: Secondary | ICD-10-CM

## 2023-10-02 DIAGNOSIS — E782 Mixed hyperlipidemia: Secondary | ICD-10-CM

## 2023-10-02 DIAGNOSIS — F320 Major depressive disorder, single episode, mild: Secondary | ICD-10-CM

## 2023-10-02 DIAGNOSIS — I10 Essential (primary) hypertension: Secondary | ICD-10-CM

## 2023-10-17 ENCOUNTER — Ambulatory Visit (INDEPENDENT_AMBULATORY_CARE_PROVIDER_SITE_OTHER): Admitting: Family

## 2023-10-17 ENCOUNTER — Encounter: Payer: Self-pay | Admitting: Family

## 2023-10-17 VITALS — BP 128/76 | HR 83 | Temp 98.0°F | Ht 64.0 in | Wt 197.6 lb

## 2023-10-17 DIAGNOSIS — I10 Essential (primary) hypertension: Secondary | ICD-10-CM

## 2023-10-17 DIAGNOSIS — M150 Primary generalized (osteo)arthritis: Secondary | ICD-10-CM | POA: Diagnosis not present

## 2023-10-17 DIAGNOSIS — Z853 Personal history of malignant neoplasm of breast: Secondary | ICD-10-CM

## 2023-10-17 DIAGNOSIS — F320 Major depressive disorder, single episode, mild: Secondary | ICD-10-CM

## 2023-10-17 DIAGNOSIS — E782 Mixed hyperlipidemia: Secondary | ICD-10-CM

## 2023-10-17 DIAGNOSIS — J309 Allergic rhinitis, unspecified: Secondary | ICD-10-CM

## 2023-10-17 DIAGNOSIS — K5900 Constipation, unspecified: Secondary | ICD-10-CM | POA: Diagnosis not present

## 2023-10-17 DIAGNOSIS — F419 Anxiety disorder, unspecified: Secondary | ICD-10-CM | POA: Diagnosis not present

## 2023-10-17 MED ORDER — METOPROLOL SUCCINATE ER 25 MG PO TB24: 12.5000 mg | ORAL_TABLET | Freq: Two times a day (BID) | ORAL | 1 refills | Status: DC

## 2023-10-17 MED ORDER — KLOR-CON M20 20 MEQ PO TBCR: 20.0000 meq | EXTENDED_RELEASE_TABLET | Freq: Two times a day (BID) | ORAL | 1 refills | Status: AC

## 2023-10-17 MED ORDER — LEVOCETIRIZINE DIHYDROCHLORIDE 5 MG PO TABS
5.0000 mg | ORAL_TABLET | Freq: Every evening | ORAL | 3 refills | Status: AC
Start: 2023-10-17 — End: ?

## 2023-10-17 MED ORDER — MELOXICAM 15 MG PO TABS: 15.0000 mg | ORAL_TABLET | Freq: Every day | ORAL | 4 refills | Status: DC

## 2023-10-17 NOTE — Progress Notes (Signed)
 Subjective:    Patient ID: Alison [name], female    DOB: [birth year], 83 y.o.   MRN: [id number]  Chief Complaint  Patient presents with   Medical Management of Chronic Issues    Light headed mibic not helping ? About k+   PT presents to the office today for chronic follow up.   She is followed by Oncologists annually for Left breast cancer in 1998 and right breast cancer 2010.     She is followed by Ortho for bilateral knee pain and getting gel injections. She completed PT.  Hypertension This is a chronic problem. The current episode started more than 1 year ago. The problem has been waxing and waning since onset. The problem is uncontrolled. Associated symptoms include anxiety and malaise/fatigue (Some times). Pertinent negatives include no peripheral edema or shortness of breath. Risk factors for coronary artery disease include dyslipidemia, diabetes mellitus, obesity and sedentary lifestyle. The current treatment provides moderate improvement.  Back Pain This is a chronic problem. The current episode started more than 1 year ago. The problem occurs intermittently. The problem has been waxing and waning since onset. The pain is present in the lumbar spine. The quality of the pain is described as aching. The pain is at a severity of 2/10. The patient is experiencing no pain.  Arthritis Presents for follow-up visit. She complains of pain and stiffness. Affected locations include the left knee, right knee and left hip. Her pain is at a severity of 8/10.  Hyperlipidemia This is a chronic problem. The current episode started more than 1 year ago. The problem is controlled. Recent lipid tests were reviewed and are normal. Exacerbating diseases include obesity. Pertinent negatives include no shortness of breath. Current antihyperlipidemic treatment includes statins. The current treatment provides moderate improvement of lipids. Risk factors for coronary artery disease include dyslipidemia,  hypertension, a sedentary lifestyle and post-menopausal.  Depression        This is a chronic problem.  The current episode started more than 1 year ago.   The problem occurs intermittently.  Associated symptoms include helplessness, hopelessness and sad.  Past treatments include SSRIs - Selective serotonin reuptake inhibitors.  Past medical history includes anxiety.   Anxiety Presents for follow-up visit. Symptoms include excessive worry and nervous/anxious behavior. Patient reports no shortness of breath. Symptoms occur occasionally. The severity of symptoms is mild.    Constipation This is a chronic problem. The current episode started more than 1 year ago. The problem has been waxing and waning since onset. Her stool frequency is 2 to 3 times per week. Associated symptoms include back pain. Risk factors include obesity. She has tried fiber for the symptoms. The treatment provided moderate relief.      Review of Systems  Constitutional:  Positive for malaise/fatigue (Some times).  Respiratory:  Negative for shortness of breath.   Gastrointestinal:  Positive for constipation.  Musculoskeletal:  Positive for back pain and stiffness.  Psychiatric/Behavioral:  The patient is nervous/anxious.   All other systems reviewed and are negative.  Family History  Problem Relation Age of Onset   Hypertension Mother    Cancer Mother        colon   Colon cancer Mother    Diabetes Sister    Heart disease Father    Breast cancer Maternal Aunt 71   Breast cancer Maternal Aunt    Social History   Socioeconomic History   Marital status: Divorced    Spouse name: Not on file  Number of children: 3   Years of education: 10   Highest education level: 10th grade  Occupational History   Occupation: Retired  Tobacco Use   Smoking status: Never   Smokeless tobacco: Never  Vaping Use   Vaping status: Never Used  Substance and Sexual Activity   Alcohol use: Yes    Comment: Rare   Drug use:  No   Sexual activity: Not Currently    Birth control/protection: Post-menopausal    Comment: 1st intercourse 83 yo-Fewer than 5 partners  Other Topics Concern   Not on file  Social History Narrative   Lives alone. Children live in [place], [place] and [address]   Social Drivers of Corporate investment banker Strain: Low Risk  (11/04/2022)   Overall Financial Resource Strain (CARDIA)    Difficulty of Paying Living Expenses: Not hard at all  Food Insecurity: No Food Insecurity (11/04/2022)   Hunger Vital Sign    Worried About Running Out of Food in the Last Year: Never true    Ran Out of Food in the Last Year: Never true  Transportation Needs: No Transportation Needs (11/04/2022)   PRAPARE - Administrator, Civil Service (Medical): No    Lack of Transportation (Non-Medical): No  Physical Activity: Insufficiently Active (11/04/2022)   Exercise Vital Sign    Days of Exercise per Week: 3 days    Minutes of Exercise per Session: 30 min  Stress: No Stress Concern Present (11/04/2022)   [employer] of Occupational Health - Occupational Stress Questionnaire    Feeling of Stress : Not at all  Social Connections: Moderately Integrated (11/04/2022)   Social Connection and Isolation Panel    Frequency of Communication with Friends and Family: More than three times a week    Frequency of Social Gatherings with Friends and Family: More than three times a week    Attends Religious Services: More than 4 times per year    Active Member of [employer] or Organizations: Yes    Attends Banker Meetings: 1 to 4 times per year    Marital Status: Divorced       Objective:   Physical Exam Vitals reviewed.  Constitutional:      General: She is not in acute distress.    Appearance: She is well-developed. She is obese.  HENT:     Head: Normocephalic and atraumatic.     Right Ear: Tympanic membrane normal.     Left Ear: Tympanic membrane normal.  Eyes:     Pupils:  Pupils are equal, round, and reactive to light.  Neck:     Thyroid : No thyromegaly.  Cardiovascular:     Rate and Rhythm: Normal rate and regular rhythm.     Heart sounds: Normal heart sounds. No murmur heard. Pulmonary:     Effort: Pulmonary effort is normal. No respiratory distress.     Breath sounds: Normal breath sounds. No wheezing.  Abdominal:     General: Bowel sounds are normal. There is no distension.     Palpations: Abdomen is soft.     Tenderness: There is no abdominal tenderness.  Musculoskeletal:        General: No tenderness.     Cervical back: Normal range of motion and neck supple.     Right lower leg: Edema present.     Left lower leg: Edema present.     Comments: Pain in bilateral knees with flexion and extension  Skin:    General: Skin  is warm and dry.  Neurological:     Mental Status: She is alert and oriented to person, place, and time.     Cranial Nerves: No cranial nerve deficit.     Deep Tendon Reflexes: Reflexes are normal and symmetric.  Psychiatric:        Behavior: Behavior normal.        Thought Content: Thought content normal.        Judgment: Judgment normal.       BP (!) 148/74   Pulse 83   Temp 98 F (36.7 C)   Ht 5' 4 (1.626 m)   Wt 197 lb 9.6 oz (89.6 kg)   LMP 01/25/2004   SpO2 98%   BMI 33.92 kg/m      Assessment & Plan:  KEANNA TUGWELL comes in today with chief complaint of Medical Management of Chronic Issues (Light headed mibic not helping ? About k+)   Diagnosis and orders addressed:  1. Chronic allergic rhinitis - levocetirizine (XYZAL ) 5 MG tablet; Take 1 tablet (5 mg total) by mouth every evening.  Dispense: 45 tablet; Refill: 3 - CMP14+EGFR  2. Hypertension with goal blood pressure less than 130/80 (Primary) - metoprolol  succinate (TOPROL -XL) 25 MG 24 hr tablet; Take 0.5 tablets (12.5 mg total) by mouth in the morning and at bedtime.  Dispense: 90 tablet; Refill: 1 - CMP14+EGFR  3. Constipation, unspecified  constipation type - CMP14+EGFR  4. Anxiety - CMP14+EGFR  5. Moderate mixed hyperlipidemia not requiring statin therapy - CMP14+EGFR  6. Depression, major, single episode, mild - CMP14+EGFR  7. HX: breast cancer - CMP14+EGFR  8. Primary osteoarthritis involving multiple joints Will increase Mobic  to 15 mg from 7.5 mg No other NSAID's  ROM exercises  - meloxicam  (MOBIC ) 15 MG tablet; Take 1 tablet (15 mg total) by mouth daily.  Dispense: 90 tablet; Refill: 4  Labs pending Fall prevention  Will increase Mobic  to 15 mg from 7.5 mg, no other NSAIDs  Continue medications  Health Maintenance reviewed Diet and exercise encouraged  Follow up plan: 6 months    Bari Learn, FNP

## 2023-10-17 NOTE — Patient Instructions (Signed)
 Hypertension, Adult High blood pressure (hypertension) is when the force of blood pumping through the arteries is too strong. The arteries are the blood vessels that carry blood from the heart throughout the body. Hypertension forces the heart to work harder to pump blood and may cause arteries to become narrow or stiff. Untreated or uncontrolled hypertension can lead to a heart attack, heart failure, a stroke, kidney disease, and other problems. A blood pressure reading consists of a higher number over a lower number. Ideally, your blood pressure should be below 120/80. The first ("top") number is called the systolic pressure. It is a measure of the pressure in your arteries as your heart beats. The second ("bottom") number is called the diastolic pressure. It is a measure of the pressure in your arteries as the heart relaxes. What are the causes? The exact cause of this condition is not known. There are some conditions that result in high blood pressure. What increases the risk? Certain factors may make you more likely to develop high blood pressure. Some of these risk factors are under your control, including: Smoking. Not getting enough exercise or physical activity. Being overweight. Having too much fat, sugar, calories, or salt (sodium) in your diet. Drinking too much alcohol. Other risk factors include: Having a personal history of heart disease, diabetes, high cholesterol, or kidney disease. Stress. Having a family history of high blood pressure and high cholesterol. Having obstructive sleep apnea. Age. The risk increases with age. What are the signs or symptoms? High blood pressure may not cause symptoms. Very high blood pressure (hypertensive crisis) may cause: Headache. Fast or irregular heartbeats (palpitations). Shortness of breath. Nosebleed. Nausea and vomiting. Vision changes. Severe chest pain, dizziness, and seizures. How is this diagnosed? This condition is diagnosed by  measuring your blood pressure while you are seated, with your arm resting on a flat surface, your legs uncrossed, and your feet flat on the floor. The cuff of the blood pressure monitor will be placed directly against the skin of your upper arm at the level of your heart. Blood pressure should be measured at least twice using the same arm. Certain conditions can cause a difference in blood pressure between your right and left arms. If you have a high blood pressure reading during one visit or you have normal blood pressure with other risk factors, you may be asked to: Return on a different day to have your blood pressure checked again. Monitor your blood pressure at home for 1 week or longer. If you are diagnosed with hypertension, you may have other blood or imaging tests to help your health care provider understand your overall risk for other conditions. How is this treated? This condition is treated by making healthy lifestyle changes, such as eating healthy foods, exercising more, and reducing your alcohol intake. You may be referred for counseling on a healthy diet and physical activity. Your health care provider may prescribe medicine if lifestyle changes are not enough to get your blood pressure under control and if: Your systolic blood pressure is above 130. Your diastolic blood pressure is above 80. Your personal target blood pressure may vary depending on your medical conditions, your age, and other factors. Follow these instructions at home: Eating and drinking  Eat a diet that is high in fiber and potassium, and low in sodium, added sugar, and fat. An example of this eating plan is called the DASH diet. DASH stands for Dietary Approaches to Stop Hypertension. To eat this way: Eat  plenty of fresh fruits and vegetables. Try to fill one half of your plate at each meal with fruits and vegetables. Eat whole grains, such as whole-wheat pasta, brown rice, or whole-grain bread. Fill about one  fourth of your plate with whole grains. Eat or drink low-fat dairy products, such as skim milk or low-fat yogurt. Avoid fatty cuts of meat, processed or cured meats, and poultry with skin. Fill about one fourth of your plate with lean proteins, such as fish, chicken without skin, beans, eggs, or tofu. Avoid pre-made and processed foods. These tend to be higher in sodium, added sugar, and fat. Reduce your daily sodium intake. Many people with hypertension should eat less than 1,500 mg of sodium a day. Do not drink alcohol if: Your health care provider tells you not to drink. You are pregnant, may be pregnant, or are planning to become pregnant. If you drink alcohol: Limit how much you have to: 0-1 drink a day for women. 0-2 drinks a day for men. Know how much alcohol is in your drink. In the [place] one drink equals one 12 oz bottle of beer (355 mL), one 5 oz glass of wine (148 mL), or one 1 oz glass of hard liquor (44 mL). Lifestyle  Work with your health care provider to maintain a healthy body weight or to lose weight. Ask what an ideal weight is for you. Get at least 30 minutes of exercise that causes your heart to beat faster (aerobic exercise) most days of the week. Activities may include walking, swimming, or biking. Include exercise to strengthen your muscles (resistance exercise), such as Pilates or lifting weights, as part of your weekly exercise routine. Try to do these types of exercises for 30 minutes at least 3 days a week. Do not use any products that contain nicotine or tobacco. These products include cigarettes, chewing tobacco, and vaping devices, such as e-cigarettes. If you need help quitting, ask your health care provider. Monitor your blood pressure at home as told by your health care provider. Keep all follow-up visits. This is important. Medicines Take over-the-counter and prescription medicines only as told by your health care provider. Follow directions carefully. Blood  pressure medicines must be taken as prescribed. Do not skip doses of blood pressure medicine. Doing this puts you at risk for problems and can make the medicine less effective. Ask your health care provider about side effects or reactions to medicines that you should watch for. Contact a health care provider if you: Think you are having a reaction to a medicine you are taking. Have headaches that keep coming back (recurring). Feel dizzy. Have swelling in your ankles. Have trouble with your vision. Get help right away if you: Develop a severe headache or confusion. Have unusual weakness or numbness. Feel faint. Have severe pain in your chest or abdomen. Vomit repeatedly. Have trouble breathing. These symptoms may be an emergency. Get help right away. Call 911. Do not wait to see if the symptoms will go away. Do not drive yourself to the hospital. Summary Hypertension is when the force of blood pumping through your arteries is too strong. If this condition is not controlled, it may put you at risk for serious complications. Your personal target blood pressure may vary depending on your medical conditions, your age, and other factors. For most people, a normal blood pressure is less than 120/80. Hypertension is treated with lifestyle changes, medicines, or a combination of both. Lifestyle changes include losing weight, eating a healthy,  low-sodium diet, exercising more, and limiting alcohol. This information is not intended to replace advice given to you by your health care provider. Make sure you discuss any questions you have with your health care provider. Document Revised: 11/17/2020 Document Reviewed: 11/17/2020 Elsevier Patient Education  2024 [employer].

## 2023-10-18 LAB — CMP14+EGFR
ALT: 26 IU/L (ref 0–32)
AST: 21 IU/L (ref 0–40)
Albumin: 4.2 g/dL (ref 3.7–4.7)
Alkaline Phosphatase: 76 IU/L (ref 48–129)
BUN/Creatinine Ratio: 21 (ref 12–28)
BUN: 21 mg/dL (ref 8–27)
Bilirubin Total: 1.5 mg/dL — ABNORMAL HIGH (ref 0.0–1.2)
CO2: 24 mmol/L (ref 20–29)
Calcium: 9.4 mg/dL (ref 8.7–10.3)
Chloride: 101 mmol/L (ref 96–106)
Creatinine, Ser: 0.98 mg/dL (ref 0.57–1.00)
Globulin, Total: 2.8 g/dL (ref 1.5–4.5)
Glucose: 98 mg/dL (ref 70–99)
Potassium: 4 mmol/L (ref 3.5–5.2)
Sodium: 140 mmol/L (ref 134–144)
Total Protein: 7 g/dL (ref 6.0–8.5)
eGFR: 58 mL/min/1.73 — ABNORMAL LOW (ref >59)

## 2023-10-19 ENCOUNTER — Ambulatory Visit: Payer: Self-pay | Admitting: Family

## 2023-10-19 ENCOUNTER — Telehealth: Payer: Self-pay

## 2023-10-19 NOTE — Telephone Encounter (Signed)
 Copied from CRM #[id number]. Topic: Clinical - Lab/Test Results >> Oct 19, 2023  8:25 AM Emylou G wrote: Reason for CRM: Advised patient of her labs

## 2023-10-19 NOTE — Telephone Encounter (Signed)
 Noted

## 2023-11-06 ENCOUNTER — Ambulatory Visit: Payer: 59

## 2023-11-06 VITALS — BP 128/76 | HR 83 | Ht 64.0 in | Wt 197.0 lb

## 2023-11-06 DIAGNOSIS — Z0000 Encounter for general adult medical examination without abnormal findings: Secondary | ICD-10-CM

## 2023-11-06 NOTE — Patient Instructions (Signed)
 Alison [name],  Thank you for taking the time for your Medicare Wellness Visit. I appreciate your continued commitment to your health goals. Please review the care plan we discussed, and feel free to reach out if I can assist you further.  Medicare recommends these wellness visits once per year to help you and your care team stay ahead of potential health issues. These visits are designed to focus on prevention, allowing your provider to concentrate on managing your acute and chronic conditions during your regular appointments.  Please note that Annual Wellness Visits do not include a physical exam. Some assessments may be limited, especially if the visit was conducted virtually. If needed, we may recommend a separate in-person follow-up with your provider.  Ongoing Care Seeing your primary care provider every 3 to 6 months helps us  monitor your health and provide consistent, personalized care.   Referrals If a referral was made during today's visit and you haven't received any updates within two weeks, please contact the referred provider directly to check on the status.  Recommended Screenings:  Health Maintenance  Topic Date Due   Colon Cancer Screening  08/10/2022   COVID-19 Vaccine (4 - 2025-26 season) 09/25/2023   Medicare Annual Wellness Visit  11/04/2023   Flu Shot  04/23/2024*   Breast Cancer Screening  08/29/2024   DTaP/Tdap/Td vaccine (2 - Td or Tdap) 05/20/2029   Pneumococcal Vaccine for age over 35  Completed   DEXA scan (bone density measurement)  Completed   Zoster (Shingles) Vaccine  Completed   Meningitis B Vaccine  Aged Out   Hepatitis C Screening  Discontinued  *Topic was postponed. The date shown is not the original due date.       11/06/2023   11:50 AM  Advanced Directives  Does Patient Have a Medical Advance Directive? No   Advance Care Planning is important because it: Ensures you receive medical care that aligns with your values, goals, and  preferences. Provides guidance to your family and loved ones, reducing the emotional burden of decision-making during critical moments.  Vision: Annual vision screenings are recommended for early detection of glaucoma, cataracts, and diabetic retinopathy. These exams can also reveal signs of chronic conditions such as diabetes and high blood pressure.  Dental: Annual dental screenings help detect early signs of oral cancer, gum disease, and other conditions linked to overall health, including heart disease and diabetes.  Please see the attached documents for additional preventive care recommendations.

## 2023-11-06 NOTE — Progress Notes (Addendum)
 Subjective:   Alison [name] is a 83 y.o. who presents for a Medicare Wellness preventive visit.  As a reminder, Annual Wellness Visits don't include a physical exam, and some assessments may be limited, especially if this visit is performed virtually. We may recommend an in-person follow-up visit with your provider if needed.  Visit Complete: Virtual I connected with  Alison [name] on 11/06/23 by a audio enabled telemedicine application and verified that I am speaking with the correct person using two identifiers.  Patient Location: Home  Provider Location: Home Office  I discussed the limitations of evaluation and management by telemedicine. The patient expressed understanding and agreed to proceed.  Vital Signs: Because this visit was a virtual/telehealth visit, some criteria may be missing or patient reported. Any vitals not documented were not able to be obtained and vitals that have been documented are patient reported.  VideoDeclined- This patient declined Librarian, academic. Therefore the visit was completed with audio only.  Persons Participating in Visit: Patient.  AWV Questionnaire: No: Patient Medicare AWV questionnaire was not completed prior to this visit.  Cardiac Risk Factors include: advanced age (>107men, >68 women);obesity (BMI >30kg/m2);dyslipidemia;hypertension     Objective:    Today's Vitals   11/06/23 1134 11/06/23 1137  BP: 128/76   Pulse: 83   Weight: 197 lb (89.4 kg)   Height: 5' 4 (1.626 m)   PainSc:  8    Body mass index is 33.81 kg/m.     11/06/2023   11:50 AM 11/07/2022   11:57 AM 11/04/2022   11:57 AM 06/27/2022    4:12 PM 09/21/2021   11:13 AM 09/18/2020    1:06 PM 10/03/2018   10:32 AM  Advanced Directives  Does Patient Have a Medical Advance Directive? No Yes Yes No No No No  Type of Special educational needs teacher of [place];Living will Healthcare Power of [place];Living will      Copy of Healthcare  Power of Attorney in Chart?  No - copy requested No - copy requested      Would patient like information on creating a medical advance directive?     No - Patient declined No - Patient declined No - Patient declined    Current Medications (verified) Outpatient Encounter Medications as of 11/06/2023  Medication Sig   acetaminophen  (TYLENOL ) 650 MG CR tablet Take 1 tablet (650 mg total) by mouth in the morning and at bedtime.   aspirin 81 MG tablet Take 81 mg by mouth daily.   Brinzolamide-Brimonidine 1-0.2 % SUSP Apply to eye.   Calcium  Carbonate-Vitamin D  (CALCIUM  + D PO) Take 2 capsules by mouth daily.    escitalopram  (LEXAPRO ) 10 MG tablet TAKE 1 TABLET BY MOUTH EVERY DAY   estradiol  (ESTRACE ) 0.1 MG/GM vaginal cream PLACE 1 APPLICATORFUL VAGINALLY 2 (TWO) TIMES A WEEK.   fluticasone  (FLONASE ) 50 MCG/ACT nasal spray SPRAY 2 SPRAYS INTO EACH NOSTRIL EVERY DAY   KLOR-CON  M20 20 MEQ tablet Take 1 tablet (20 mEq total) by mouth 2 (two) times daily.   latanoprost (XALATAN) 0.005 % ophthalmic solution 1 drop at bedtime.   levocetirizine (XYZAL ) 5 MG tablet Take 1 tablet (5 mg total) by mouth every evening.   losartan -hydrochlorothiazide (HYZAAR) 100-25 MG tablet TAKE 1 TABLET BY MOUTH EVERY DAY   meloxicam  (MOBIC ) 15 MG tablet Take 1 tablet (15 mg total) by mouth daily.   metoprolol  succinate (TOPROL -XL) 25 MG 24 hr tablet Take 0.5 tablets (12.5 mg total) by  mouth in the morning and at bedtime.   Omega-3 Fatty Acids (FISH OIL PO) Take 1 capsule by mouth daily.   rosuvastatin  (CRESTOR ) 10 MG tablet TAKE 1 TABLET BY MOUTH EVERY DAY   No facility-administered encounter medications on file as of 11/06/2023.    Allergies (verified) Patient has no known allergies.   History: Past Medical History:  Diagnosis Date   Anxiety    Arthritis    Breast cancer (HCC)    X2   Cervical dysplasia    GERD (gastroesophageal reflux disease)    takes otc meds   Glaucoma    Hyperlipidemia     Hypertension    Lichen sclerosus    Personal history of chemotherapy 2010   Personal history of radiation therapy 1999   Shingles 2012   Past Surgical History:  Procedure Laterality Date   BREAST BIOPSY Right 2010   BREAST LUMPECTOMY Right 2010   BREAST LUMPECTOMY Left    BREAST SURGERY     Lumpectomy right and left   GYNECOLOGIC CRYOSURGERY     HYSTEROSCOPY     PORT-A-CATH REMOVAL Left 01/31/2014   Procedure: REMOVAL PORT-A-CATH;  Surgeon: Deward Null III, MD;  Location: [facility] OR;  Service: General;  Laterality: Left;   TUBAL LIGATION     Family History  Problem Relation Age of Onset   Hypertension Mother    Cancer Mother        colon   Colon cancer Mother    Diabetes Sister    Heart disease Father    Breast cancer Maternal Aunt 48   Breast cancer Maternal Aunt    Social History   Socioeconomic History   Marital status: Divorced    Spouse name: Not on file   Number of children: 3   Years of education: 10   Highest education level: 10th grade  Occupational History   Occupation: Retired  Tobacco Use   Smoking status: Never   Smokeless tobacco: Never  Vaping Use   Vaping status: Never Used  Substance and Sexual Activity   Alcohol use: Yes    Comment: Rare   Drug use: No   Sexual activity: Not Currently    Birth control/protection: Post-menopausal    Comment: 1st intercourse 83 yo-Fewer than 5 partners  Other Topics Concern   Not on file  Social History Narrative   Lives alone. Children live in [place], [place] and [address]   Social Drivers of Corporate investment banker Strain: Low Risk  (11/06/2023)   Overall Financial Resource Strain (CARDIA)    Difficulty of Paying Living Expenses: Not hard at all  Food Insecurity: No Food Insecurity (11/06/2023)   Hunger Vital Sign    Worried About Running Out of Food in the Last Year: Never true    Ran Out of Food in the Last Year: Never true  Transportation Needs: No Transportation Needs (11/06/2023)   PRAPARE -  Administrator, Civil Service (Medical): No    Lack of Transportation (Non-Medical): No  Physical Activity: Insufficiently Active (11/06/2023)   Exercise Vital Sign    Days of Exercise per Week: 3 days    Minutes of Exercise per Session: 30 min  Stress: No Stress Concern Present (11/06/2023)   [employer] of Occupational Health - Occupational Stress Questionnaire    Feeling of Stress: Only a little  Social Connections: Moderately Integrated (11/06/2023)   Social Connection and Isolation Panel    Frequency of Communication with Friends and Family: More than  three times a week    Frequency of Social Gatherings with Friends and Family: More than three times a week    Attends Religious Services: More than 4 times per year    Active Member of [employer] or Organizations: Yes    Attends Banker Meetings: 1 to 4 times per year    Marital Status: Divorced    Tobacco Counseling Counseling given: Yes    Clinical Intake:  Pre-visit preparation completed: Yes  Pain : 0-10 (knee pain) Pain Score: 8  Pain Type: Chronic pain Pain Location: Knee Pain Orientation: Right, Left Pain Descriptors / Indicators: Aching, Constant Pain Frequency: Intermittent     BMI - recorded: 33.81 Nutritional Status: BMI > 30  Obese Nutritional Risks: None Diabetes: No  No results found for: HGBA1C   How often do you need to have someone help you when you read instructions, pamphlets, or other written materials from your doctor or pharmacy?: 1 - Never  Interpreter Needed?: No  Information entered by :: alia t/cma   Activities of Daily Living     11/06/2023   11:45 AM 11/07/2022   11:58 AM  In your present state of health, do you have any difficulty performing the following activities:  Hearing? 1 0  Vision? 1 0  Difficulty concentrating or making decisions? 0 0  Walking or climbing stairs? 1 0  Dressing or bathing? 0 0  Doing errands, shopping? 1 0  Comment pt's  daughter   Quarry manager and eating ? N N  Using the Toilet? N N  In the past six months, have you accidently leaked urine? Y N  Do you have problems with loss of bowel control? N N  Managing your Medications? N N  Managing your Finances? N N  Housekeeping or managing your Housekeeping? N N    Patient Care Team: Lavell Bari LABOR, FNP as PCP - General (Family Medicine) Camella Delroy SQUIBB, MD (Hematology and Oncology)  I have updated your Care Teams any recent Medical Services you may have received from other providers in the past year.     Assessment:   This is a routine wellness examination for Makyra.  Hearing/Vision screen Hearing Screening - Comments:: Pt have a little bit of hearing dif Vision Screening - Comments:: Pt has glaucoma/pt wear glasses/pt goes to Dr Octavia in [place]/upcoming 10/2023   Goals Addressed             This Visit's Progress    awv   On track    10/03/2018 AWV Goal: Exercise for General Health  Patient will verbalize understanding of the benefits of increased physical activity: Exercising regularly is important. It will improve your overall fitness, flexibility, and endurance. Regular exercise also will improve your overall health. It can help you control your weight, reduce stress, and improve your bone density. Over the next year, patient will increase physical activity as tolerated with a goal of at least 150 minutes of moderate physical activity per week.  You can tell that you are exercising at a moderate intensity if your heart starts beating faster and you start breathing faster but can still hold a conversation. Moderate-intensity exercise ideas include: Walking 1 mile (1.6 km) in about 15 minutes Biking Hiking Golfing Dancing Water aerobics Patient will verbalize understanding of everyday activities that increase physical activity by providing examples like the following: Yard work, such as: Astronomer  Washing windows or floors Patient will be able to explain general safety guidelines for exercising:  Before you start a new exercise program, talk with your health care provider. Do not exercise so much that you hurt yourself, feel dizzy, or get very short of breath. Wear comfortable clothes and wear shoes with good support. Drink plenty of water while you exercise to prevent dehydration or heat stroke. Work out until your breathing and your heartbeat get faster.        Depression Screen     11/06/2023   11:50 AM 10/17/2023   10:56 AM 10/17/2023   10:47 AM 04/17/2023   11:06 AM 12/12/2022   11:04 AM 12/12/2022   10:58 AM 11/04/2022   10:37 AM  PHQ 2/9 Scores  PHQ - 2 Score 1 1 0 1 4 0 0  PHQ- 9 Score 1 4 0 1 5  0    Fall Risk     11/06/2023   11:36 AM 10/17/2023   10:55 AM 10/17/2023   10:47 AM 04/17/2023   10:27 AM 12/12/2022   10:58 AM  Fall Risk   Falls in the past year? 1 1 0 1 0  Number falls in past yr: 0 0 0 1   Injury with Fall? 0 0 0 1   Risk for fall due to : Impaired balance/gait;Impaired mobility Impaired balance/gait No Fall Risks No Fall Risks   Follow up Falls evaluation completed;Education provided Falls evaluation completed;Education provided Falls evaluation completed;Education provided Falls evaluation completed;Education provided     MEDICARE RISK AT HOME:  Medicare Risk at Home Any stairs in or around the home?: No If so, are there any without handrails?: No Home free of loose throw rugs in walkways, pet beds, electrical cords, etc?: Yes Adequate lighting in your home to reduce risk of falls?: Yes Life alert?: No Use of a cane, walker or w/c?: Yes Grab bars in the bathroom?: Yes Shower chair or bench in shower?: No Elevated toilet seat or a handicapped toilet?: No  TIMED UP AND GO:  Was the test performed?  no  Cognitive Function: 6CIT completed        11/06/2023    11:50 AM 11/07/2022   12:00 PM 11/07/2022   11:59 AM 11/07/2022   11:58 AM 09/21/2021   11:14 AM  6CIT Screen  What Year? 0 points 0 points 0 points 0 points 0 points  What month? 0 points 0 points 0 points 0 points 0 points  What time? 0 points 0 points 0 points 0 points 0 points  Count back from 20 0 points 0 points 0 points 0 points 2 points  Months in reverse 0 points 0 points 0 points 0 points 0 points  Repeat phrase 0 points 0 points 0 points 0 points 2 points  Total Score 0 points 0 points 0 points 0 points 4 points    Immunizations Immunization History  Administered Date(s) Administered   Fluad Quad(high Dose 65+) 11/12/2018, 11/21/2019, 03/25/2022   Fluad Trivalent(High Dose 65+) 04/17/2023   INFLUENZA, HIGH DOSE SEASONAL PF 01/07/2016   Influenza Split 01/20/2018   Influenza, Seasonal, Injecte, Preservative Fre 12/03/2012, 01/01/2015, 10/25/2016   Influenza,inj,Quad PF,6+ Mos 12/03/2012, 01/01/2015   Influenza,trivalent, recombinat, inj, PF 11/11/2013, 10/25/2016, 01/20/2018   Influenza-Unspecified 12/03/2012, 11/11/2013, 01/01/2015, 10/25/2016, 11/24/2020   Moderna Sars-Covid-2 Vaccination 02/04/2019, 03/07/2019, 11/28/2019   Pneumococcal Conjugate-13 02/17/2016   Pneumococcal Polysaccharide-23 05/31/2017   Tdap 05/21/2019   Zoster Recombinant(Shingrix ) 03/30/2021, 07/02/2021    Screening Tests Health  Maintenance  Topic Date Due   Colonoscopy  08/10/2022   COVID-19 Vaccine (4 - 2025-26 season) 09/25/2023   Influenza Vaccine  04/23/2024 (Originally 08/25/2023)   Mammogram  08/29/2024   Medicare Annual Wellness (AWV)  11/05/2024   DTaP/Tdap/Td (2 - Td or Tdap) 05/20/2029   Pneumococcal Vaccine: 50+ Years  Completed   DEXA SCAN  Completed   Zoster Vaccines- Shingrix   Completed   Meningococcal B Vaccine  Aged Out   Hepatitis C Screening  Discontinued    Health Maintenance Items Addressed: See Nurse Notes at the end of this note  Additional Screening:  Vision  Screening: Recommended annual ophthalmology exams for early detection of glaucoma and other disorders of the eye. Is the patient up to date with their annual eye exam?  Yes  Who is the provider or what is the name of the office in which the patient attends annual eye exams? Dr. Octavia in [place]  Dental Screening: Recommended annual dental exams for proper oral hygiene  Community Resource Referral / Chronic Care Management: CRR required this visit?  No   CCM required this visit?  No   Plan:    I have personally reviewed and noted the following in the patient's chart:   Medical and social history Use of alcohol, tobacco or illicit drugs  Current medications and supplements including opioid prescriptions. Patient is not currently taking opioid prescriptions. Functional ability and status Nutritional status Physical activity Advanced directives List of other physicians Hospitalizations, surgeries, and ER visits in previous 12 months Vitals Screenings to include cognitive, depression, and falls Referrals and appointments  In addition, I have reviewed and discussed with patient certain preventive protocols, quality metrics, and best practice recommendations. A written personalized care plan for preventive services as well as general preventive health recommendations were provided to patient.   Ozie Ned, CMA   11/06/2023   After Visit Summary: (MyChart) Due to this being a telephonic visit, the after visit summary with patients personalized plan was offered to patient via MyChart   Notes: PCP Follow Up Recommendations: Per pt stated that she did a cologuard the year before, however, there is no documentation.

## 2023-11-07 ENCOUNTER — Telehealth: Payer: Self-pay

## 2023-11-07 MED ORDER — CELECOXIB 200 MG PO CAPS: 200.0000 mg | ORAL_CAPSULE | Freq: Two times a day (BID) | ORAL | 2 refills | Status: DC

## 2023-11-07 NOTE — Telephone Encounter (Signed)
 Patient aware and verbalized understanding. Patient would like to try something that you think is best.

## 2023-11-07 NOTE — Addendum Note (Signed)
 Addended by: LAVELL LYE A on: 11/07/2023 03:39 PM   Modules accepted: Orders

## 2023-11-07 NOTE — Telephone Encounter (Signed)
 NO ANSWER

## 2023-11-07 NOTE — Telephone Encounter (Signed)
 Mobic  15 mg is max dose. If this is not working we can try to change medication.

## 2023-11-07 NOTE — Telephone Encounter (Signed)
 Patient aware and verbalized understanding.

## 2023-11-07 NOTE — Telephone Encounter (Signed)
 Stop Mobic . I have sent in a Celebrex 200 mg BID. While taking this no other NSAID's.

## 2023-11-07 NOTE — Telephone Encounter (Signed)
 During pt's awv,11/06/23, pt stated that pcp was going to increase her Mobic ? Pt also stated that she has not heard from the pharmacy yet? Pls advise pt

## 2023-11-09 ENCOUNTER — Telehealth: Payer: Self-pay

## 2023-11-09 NOTE — Telephone Encounter (Signed)
 I can,please make patient and appointment for me.

## 2023-11-09 NOTE — Telephone Encounter (Signed)
 Copied from CRM #[id number]. Topic: Clinical - Medical Advice >> Nov 09, 2023  3:24 PM Terri G wrote: Reason for CRM: Patient wanted to know if Dr.Hawks do Cortizone shots for her knee? Callback number [phone]

## 2023-11-10 NOTE — Telephone Encounter (Signed)
 Appt scheduled for 11/14/2023

## 2023-11-14 ENCOUNTER — Ambulatory Visit (INDEPENDENT_AMBULATORY_CARE_PROVIDER_SITE_OTHER): Admitting: Family

## 2023-11-14 ENCOUNTER — Encounter: Payer: Self-pay | Admitting: Family

## 2023-11-14 VITALS — BP 115/70 | HR 70 | Temp 97.6°F | Ht 64.0 in | Wt 200.0 lb

## 2023-11-14 DIAGNOSIS — M170 Bilateral primary osteoarthritis of knee: Secondary | ICD-10-CM

## 2023-11-14 MED ORDER — METHYLPREDNISOLONE ACETATE 80 MG/ML IJ SUSP
80.0000 mg | Freq: Once | INTRAMUSCULAR | Status: AC
  Administered 2023-11-14: 80 mg via INTRA_ARTICULAR

## 2023-11-14 MED ORDER — BUPIVACAINE HCL 0.25 % IJ SOLN
1.0000 mL | Freq: Once | INTRAMUSCULAR | Status: AC
  Administered 2023-11-14: 1 mL via INTRA_ARTICULAR

## 2023-11-14 MED ORDER — METHYLPREDNISOLONE ACETATE 80 MG/ML IJ SUSP
80.0000 mg | Freq: Once | INTRAMUSCULAR | Status: AC
  Administered 2023-11-14: 80 mg via INTRAMUSCULAR

## 2023-11-14 NOTE — Progress Notes (Signed)
 Subjective:    Patient ID: Alison [name], female    DOB: 19-Oct-1940, 83 y.o.   MRN: [id number]  Chief Complaint  Patient presents with   Knee Pain   PT presents to the office today with bilateral knee pain. She has osteoarthritis and requesting steroid injection.  Knee Pain  The incident occurred more than 1 week ago. There was no injury mechanism. The pain is present in the right knee and left knee. The quality of the pain is described as aching. The pain is at a severity of 8/10. The pain is moderate. The pain has been Intermittent since onset. Pertinent negatives include no numbness or tingling. She reports no foreign bodies present. The symptoms are aggravated by movement and weight bearing. She has tried acetaminophen  and ice for the symptoms. The treatment provided mild relief.      Review of Systems  Neurological:  Negative for tingling and numbness.  All other systems reviewed and are negative.   Social History   Socioeconomic History   Marital status: Divorced    Spouse name: Not on file   Number of children: 3   Years of education: 10   Highest education level: 10th grade  Occupational History   Occupation: Retired  Tobacco Use   Smoking status: Never   Smokeless tobacco: Never  Vaping Use   Vaping status: Never Used  Substance and Sexual Activity   Alcohol use: Yes    Comment: Rare   Drug use: No   Sexual activity: Not Currently    Birth control/protection: Post-menopausal    Comment: 1st intercourse 83 yo-Fewer than 5 partners  Other Topics Concern   Not on file  Social History Narrative   Lives alone. Children live in [place], [place] and [address]   Social Drivers of Corporate investment banker Strain: Low Risk  (11/06/2023)   Overall Financial Resource Strain (CARDIA)    Difficulty of Paying Living Expenses: Not hard at all  Food Insecurity: No Food Insecurity (11/06/2023)   Hunger Vital Sign    Worried About Running Out of Food in the Last  Year: Never true    Ran Out of Food in the Last Year: Never true  Transportation Needs: No Transportation Needs (11/06/2023)   PRAPARE - Administrator, Civil Service (Medical): No    Lack of Transportation (Non-Medical): No  Physical Activity: Insufficiently Active (11/06/2023)   Exercise Vital Sign    Days of Exercise per Week: 3 days    Minutes of Exercise per Session: 30 min  Stress: No Stress Concern Present (11/06/2023)   [employer] of Occupational Health - Occupational Stress Questionnaire    Feeling of Stress: Only a little  Social Connections: Moderately Integrated (11/06/2023)   Social Connection and Isolation Panel    Frequency of Communication with Friends and Family: More than three times a week    Frequency of Social Gatherings with Friends and Family: More than three times a week    Attends Religious Services: More than 4 times per year    Active Member of [employer] or Organizations: Yes    Attends Banker Meetings: 1 to 4 times per year    Marital Status: Divorced   Family History  Problem Relation Age of Onset   Hypertension Mother    Cancer Mother        colon   Colon cancer Mother    Diabetes Sister    Heart disease Father  Breast cancer Maternal Aunt 50   Breast cancer Maternal Aunt         Objective:   Physical Exam Vitals reviewed.  Constitutional:      General: She is not in acute distress.    Appearance: She is well-developed. She is obese.  HENT:     Head: Normocephalic and atraumatic.  Eyes:     Pupils: Pupils are equal, round, and reactive to light.  Neck:     Thyroid : No thyromegaly.  Cardiovascular:     Rate and Rhythm: Normal rate and regular rhythm.     Heart sounds: Normal heart sounds. No murmur heard. Pulmonary:     Effort: Pulmonary effort is normal. No respiratory distress.     Breath sounds: Normal breath sounds. No wheezing.  Abdominal:     General: Bowel sounds are normal. There is no  distension.     Palpations: Abdomen is soft.     Tenderness: There is no abdominal tenderness.  Musculoskeletal:        General: Tenderness present.     Cervical back: Normal range of motion and neck supple.  Skin:    General: Skin is warm and dry.  Neurological:     Mental Status: She is alert and oriented to person, place, and time.     Cranial Nerves: No cranial nerve deficit.     Deep Tendon Reflexes: Reflexes are normal and symmetric.  Psychiatric:        Behavior: Behavior normal.        Thought Content: Thought content normal.        Judgment: Judgment normal.     right knee prepped with betadine Injected with Marcaine  .25% plain and methylprednisolone  with 25 guage needle x 1. Patient tolerated well.  Left knee prepped with betadine Injected with Marcaine  .25% plain and methylprednisolone  with 25 guage needle x 1. Patient tolerated well.  BP 115/70   Pulse 70   Temp 97.6 F (36.4 C) (Temporal)   Ht 5' 4 (1.626 m)   Wt 200 lb (90.7 kg)   LMP 01/25/2004   BMI 34.33 kg/m      Assessment & Plan:  Alison [name] comes in today with chief complaint of Knee Pain   Diagnosis and orders addressed:  1. Primary osteoarthritis of both knees (Primary) Rest ROM exercises  Keep follow up with Ortho Ice Follow up if symptoms worsen or do not improve  - bupivacaine  (MARCAINE ) 0.25 % (with pres) injection 1 mL - methylPREDNISolone  acetate (DEPO-MEDROL ) injection 80 mg - bupivacaine  (MARCAINE ) 0.25 % (with pres) injection 1 mL - methylPREDNISolone  acetate (DEPO-MEDROL ) injection 80 mg     Alison Michelotti, FNP

## 2023-11-14 NOTE — Patient Instructions (Signed)

## 2023-11-27 ENCOUNTER — Other Ambulatory Visit: Payer: Self-pay | Admitting: Family

## 2023-11-27 ENCOUNTER — Other Ambulatory Visit: Payer: Self-pay | Admitting: *Deleted

## 2023-11-27 ENCOUNTER — Telehealth: Payer: Self-pay | Admitting: Family Medicine

## 2023-11-27 DIAGNOSIS — I10 Essential (primary) hypertension: Secondary | ICD-10-CM

## 2023-11-27 MED ORDER — METOPROLOL SUCCINATE ER 25 MG PO TB24: 12.5000 mg | ORAL_TABLET | Freq: Two times a day (BID) | ORAL | 1 refills | Status: AC

## 2023-11-27 MED ORDER — CELECOXIB 200 MG PO CAPS: 200.0000 mg | ORAL_CAPSULE | Freq: Two times a day (BID) | ORAL | 2 refills | Status: AC

## 2023-11-27 NOTE — Telephone Encounter (Signed)
 Copied from CRM #[id number]. Topic: Clinical - Prescription Issue >> Nov 27, 2023  4:21 PM Tobias L wrote: Reason for CRM: Patient states she was unaware of change from mobic  to celebrex. Patient states she has not received celebrex from pharmacy at all. Showing sent on 11/07/2023.   Patient requesting celebrex [facility] - [address] [place] [address]

## 2023-11-27 NOTE — Telephone Encounter (Unsigned)
 Copied from CRM [id number]. Topic: Clinical - Medication Refill >> Nov 27, 2023  9:29 AM Brittany M wrote: Medication: meloxicam  (MOBIC ) 15 MG tablet ; metoprolol  succinate (TOPROL -XL) 25 MG 24 hr tablet  Has the patient contacted their pharmacy? Yes (Agent: If no, request that the patient contact the pharmacy for the refill. If patient does not wish to contact the pharmacy document the reason why and proceed with request.) (Agent: If yes, when and what did the pharmacy advise?)  This is the patient's preferred pharmacy:  [id number] [place] [place] - [address] [place] [address] Phone: [phone] Fax: [phone]  Is this the correct pharmacy for this prescription? Yes If no, delete pharmacy and type the correct one.   Has the prescription been filled recently? Yes  Is the patient out of the medication? Yes  Has the patient been seen for an appointment in the last year OR does the patient have an upcoming appointment? Yes  Can we respond through MyChart? Yes  Agent: Please be advised that Rx refills may take up to 3 business days. We ask that you follow-up with your pharmacy.

## 2023-11-27 NOTE — Telephone Encounter (Signed)
 Copied from CRM [id number]. Topic: Clinical - Medication Refill >> Nov 27, 2023  9:29 AM Brittany M wrote: Medication: meloxicam  (MOBIC ) 15 MG tablet ; metoprolol  succinate (TOPROL -XL) 25 MG 24 hr tablet   Has the patient contacted their pharmacy? Yes (Agent: If no, request that the patient contact the pharmacy for the refill. If patient does not wish to contact the pharmacy document the reason why and proceed with request.) (Agent: If yes, when and what did the pharmacy advise?)   This is the patient's preferred pharmacy:  [phone] [place],  - [address] [place] [address] Phone: [phone] Fax: [phone]

## 2024-01-01 ENCOUNTER — Telehealth: Payer: Self-pay | Admitting: Family Medicine

## 2024-01-01 NOTE — Telephone Encounter (Unsigned)
 Copied from CRM [id number]. Topic: Clinical - Medication Question >> Jan 01, 2024  1:23 PM Teressa P wrote: Reason for CRM: Patient called asking if she is supposed o take the Celebrex  with the Meloxican?  [phone]

## 2024-01-02 NOTE — Telephone Encounter (Signed)
Called and spoke with patient she is aware and verbalized understanding.

## 2024-01-02 NOTE — Telephone Encounter (Signed)
 No, we do not have Mobic  on her list any more. These are the same class. She should only be on celebrex .

## 2024-04-15 ENCOUNTER — Ambulatory Visit: Payer: Self-pay | Admitting: Family

## 2024-11-06 ENCOUNTER — Ambulatory Visit: Payer: Self-pay
